# Patient Record
Sex: Male | Born: 1935 | Race: White | Hispanic: No | Marital: Married | Smoking: Former smoker
Health system: Southern US, Community
[De-identification: ages and names within clinical notes are randomized; demographics above are authoritative.]

## PROBLEM LIST (undated history)

## (undated) DIAGNOSIS — N400 Enlarged prostate without lower urinary tract symptoms: Secondary | ICD-10-CM

## (undated) DIAGNOSIS — I10 Essential (primary) hypertension: Secondary | ICD-10-CM

## (undated) DIAGNOSIS — E559 Vitamin D deficiency, unspecified: Secondary | ICD-10-CM

## (undated) DIAGNOSIS — K219 Gastro-esophageal reflux disease without esophagitis: Secondary | ICD-10-CM

## (undated) DIAGNOSIS — H269 Unspecified cataract: Secondary | ICD-10-CM

## (undated) DIAGNOSIS — C61 Malignant neoplasm of prostate: Secondary | ICD-10-CM

## (undated) DIAGNOSIS — E785 Hyperlipidemia, unspecified: Secondary | ICD-10-CM

## (undated) HISTORY — DX: Malignant neoplasm of prostate: C61

## (undated) HISTORY — DX: Hyperlipidemia, unspecified: E78.5

## (undated) HISTORY — DX: Vitamin D deficiency, unspecified: E55.9

## (undated) HISTORY — PX: SPINE SURGERY: SHX786

## (undated) HISTORY — PX: OTHER SURGICAL HISTORY: SHX169

## (undated) HISTORY — DX: Benign prostatic hyperplasia without lower urinary tract symptoms: N40.0

## (undated) HISTORY — DX: Essential (primary) hypertension: I10

## (undated) HISTORY — DX: Gastro-esophageal reflux disease without esophagitis: K21.9

## (undated) HISTORY — DX: Unspecified cataract: H26.9

---

## 1999-07-21 ENCOUNTER — Other Ambulatory Visit: Admission: RE | Admit: 1999-07-21 | Discharge: 1999-07-21 | Payer: Self-pay | Admitting: Urology

## 2002-09-04 ENCOUNTER — Encounter: Payer: Self-pay | Admitting: Gastroenterology

## 2004-03-27 ENCOUNTER — Encounter: Admission: RE | Admit: 2004-03-27 | Discharge: 2004-03-27 | Payer: Self-pay | Admitting: Family Medicine

## 2004-09-19 ENCOUNTER — Ambulatory Visit (HOSPITAL_COMMUNITY): Admission: RE | Admit: 2004-09-19 | Discharge: 2004-09-19 | Payer: Self-pay | Admitting: Family Medicine

## 2005-03-09 ENCOUNTER — Ambulatory Visit (HOSPITAL_COMMUNITY): Admission: RE | Admit: 2005-03-09 | Discharge: 2005-03-09 | Payer: Self-pay | Admitting: Family Medicine

## 2005-08-20 ENCOUNTER — Ambulatory Visit: Admission: RE | Admit: 2005-08-20 | Discharge: 2005-09-17 | Payer: Self-pay | Admitting: Radiation Oncology

## 2005-08-27 ENCOUNTER — Ambulatory Visit: Payer: Self-pay | Admitting: Gastroenterology

## 2005-09-14 ENCOUNTER — Ambulatory Visit: Payer: Self-pay | Admitting: Gastroenterology

## 2005-09-20 LAB — HM COLONOSCOPY

## 2005-09-23 ENCOUNTER — Ambulatory Visit: Payer: Self-pay | Admitting: Gastroenterology

## 2006-01-12 ENCOUNTER — Ambulatory Visit: Admission: RE | Admit: 2006-01-12 | Discharge: 2006-01-25 | Payer: Self-pay | Admitting: Radiation Oncology

## 2006-03-05 ENCOUNTER — Ambulatory Visit (HOSPITAL_COMMUNITY): Admission: RE | Admit: 2006-03-05 | Discharge: 2006-03-05 | Payer: Self-pay | Admitting: *Deleted

## 2006-04-13 ENCOUNTER — Ambulatory Visit: Admission: RE | Admit: 2006-04-13 | Discharge: 2006-04-29 | Payer: Self-pay | Admitting: Radiation Oncology

## 2006-06-14 ENCOUNTER — Ambulatory Visit: Admission: RE | Admit: 2006-06-14 | Discharge: 2006-09-12 | Payer: Self-pay | Admitting: Radiation Oncology

## 2006-10-05 ENCOUNTER — Ambulatory Visit (HOSPITAL_COMMUNITY): Admission: RE | Admit: 2006-10-05 | Discharge: 2006-10-05 | Payer: Self-pay | Admitting: Family Medicine

## 2008-01-11 ENCOUNTER — Ambulatory Visit: Payer: Self-pay | Admitting: Cardiology

## 2009-06-03 ENCOUNTER — Encounter: Payer: Self-pay | Admitting: Cardiology

## 2010-09-11 ENCOUNTER — Encounter: Payer: Self-pay | Admitting: Gastroenterology

## 2010-12-09 ENCOUNTER — Encounter (INDEPENDENT_AMBULATORY_CARE_PROVIDER_SITE_OTHER): Payer: Self-pay | Admitting: *Deleted

## 2010-12-18 ENCOUNTER — Encounter: Payer: Self-pay | Admitting: Gastroenterology

## 2011-01-19 DIAGNOSIS — C61 Malignant neoplasm of prostate: Secondary | ICD-10-CM | POA: Insufficient documentation

## 2011-01-19 DIAGNOSIS — Z860100 Personal history of colon polyps, unspecified: Secondary | ICD-10-CM | POA: Insufficient documentation

## 2011-01-19 DIAGNOSIS — I4510 Unspecified right bundle-branch block: Secondary | ICD-10-CM | POA: Insufficient documentation

## 2011-01-19 DIAGNOSIS — K5730 Diverticulosis of large intestine without perforation or abscess without bleeding: Secondary | ICD-10-CM | POA: Insufficient documentation

## 2011-01-19 DIAGNOSIS — I10 Essential (primary) hypertension: Secondary | ICD-10-CM | POA: Insufficient documentation

## 2011-01-19 DIAGNOSIS — K648 Other hemorrhoids: Secondary | ICD-10-CM | POA: Insufficient documentation

## 2011-01-19 DIAGNOSIS — E785 Hyperlipidemia, unspecified: Secondary | ICD-10-CM | POA: Insufficient documentation

## 2011-01-19 HISTORY — DX: Malignant neoplasm of prostate: C61

## 2011-01-20 ENCOUNTER — Ambulatory Visit
Admission: RE | Admit: 2011-01-20 | Discharge: 2011-01-20 | Payer: Self-pay | Source: Home / Self Care | Attending: Gastroenterology | Admitting: Gastroenterology

## 2011-01-20 ENCOUNTER — Encounter (INDEPENDENT_AMBULATORY_CARE_PROVIDER_SITE_OTHER): Payer: Self-pay | Admitting: *Deleted

## 2011-01-22 NOTE — Letter (Signed)
Summary: New Patient letter  [facility]  [address] [place], [place] [address]   Phone: [phone]  Fax: [phone]       12/09/2010 MRN: [id number]  Alec Davis [address] [place], [place]  [address]  Dear Mr. Artz,  Welcome to the Gastroenterology Division at [facility].    You are scheduled to see Dr.  Jarold Motto on 01-20-11 at 2:45p.m. on the 3rd floor at [facility], [address]  We ask that you try to arrive at our office 15 minutes prior to your appointment time to allow for check-in.  We would like you to complete the enclosed self-administered evaluation form prior to your visit and bring it with you on the day of your appointment.  We will review it with you.  Also, please bring a complete list of all your medications or, if you prefer, bring the medication bottles and we will list them.  Please bring your insurance card so that we may make a copy of it.  If your insurance requires a referral to see a specialist, please bring your referral form from your primary care physician.  Co-payments are due at the time of your visit and may be paid by cash, check or credit card.     Your office visit will consist of a consult with your physician (includes a physical exam), any laboratory testing he/she may order, scheduling of any necessary diagnostic testing (e.g. x-ray, ultrasound, CT-scan), and scheduling of a procedure (e.g. Endoscopy, Colonoscopy) if required.  Please allow enough time on your schedule to allow for any/all of these possibilities.    If you cannot keep your appointment, please call [phone] to cancel or reschedule prior to your appointment date.  This allows [place] the opportunity to schedule an appointment for another patient in need of care.  If you do not cancel or reschedule by 5 p.m. the business day prior to your appointment date, you will be charged a $50.00 late cancellation/no-show fee.    Thank you for choosing  [facility] for your medical needs.  We appreciate the opportunity to care for you.  Please visit [place] at our website  to learn more about our practice.                     Sincerely,                                                             The Gastroenterology Division

## 2011-01-27 ENCOUNTER — Other Ambulatory Visit: Payer: Self-pay | Admitting: Gastroenterology

## 2011-01-27 ENCOUNTER — Encounter (INDEPENDENT_AMBULATORY_CARE_PROVIDER_SITE_OTHER): Payer: Self-pay | Admitting: *Deleted

## 2011-01-27 ENCOUNTER — Other Ambulatory Visit: Payer: Self-pay

## 2011-01-27 DIAGNOSIS — Z86010 Personal history of colonic polyps: Secondary | ICD-10-CM

## 2011-01-28 NOTE — Letter (Signed)
Summary: [facility]: Immunoassay Fecal Occult Blood (iFOB) Order [facility]  [address] [place], [place] [address]   Phone: [phone]  Fax: [phone]       Lab: Immunoassay Fecal Occult Blood (iFOB) Order Form   January 20, 2011 MRN: [id number]   Alec Davis Mar 03, 1923   Physicican Name: Dr. Sheryn Bison  Diagnosis Code: v12.72      Harlow Mares CMA (AAMA)

## 2011-01-28 NOTE — Assessment & Plan Note (Signed)
Summary: discuss COL--ch.   History of Present Illness Visit Type: Initial Consult Primary GI MD: Sheryn Bison MD FACP FAGA Primary Provider: Rudi Heap, MD Requesting Provider: Rudi Heap, MD Chief Complaint: Patient here to discuss having a colonoscopy at age 77. He denies any current GI symptoms. History of Present Illness:   77 year old Caucasian male with mild essential hypertension and hyperlipidemia. He had colonoscopy with removal of a prominent right colon polyp in 2003.Followup colonoscopy in October of 2006 was entirely normal except for mild diverticulosis and some internal hemorrhoids.  He currently is asymptomatic, and denies abdominal pain, irregular bowel habits, melena, hematochezia, anemia, anorexia or weight loss. He does take aspirin 81 mg a day. Family history is noncontributory without a history of colon polyps or colon cancer.   GI Review of Systems      Denies abdominal pain, acid reflux, belching, bloating, chest pain, dysphagia with liquids, dysphagia with solids, heartburn, loss of appetite, nausea, vomiting, vomiting blood, weight loss, and  weight gain.        Denies anal fissure, black tarry stools, change in bowel habit, constipation, diarrhea, diverticulosis, fecal incontinence, heme positive stool, hemorrhoids, irritable bowel syndrome, jaundice, light color stool, liver problems, rectal bleeding, and  rectal pain. Preventive Screening-Counseling & Management      Drug Use:  no.      Current Medications (verified): 1)  Niaspan 750 Mg Cr-Tabs (Niacin (Antihyperlipidemic)) .... Take 1 Tablet By Mouth Once A Day 2)  Tamsulosin Hcl 0.4 Mg Caps (Tamsulosin Hcl) .... Take 1 Tablet By Mouth Once A Day 3)  Lovastatin 20 Mg Tabs (Lovastatin) .... Take 1 Tablet By Mouth Once A Day 4)  Vitamin D 1000 Unit Tabs (Cholecalciferol) .... Take 1 Tablet By Mouth Once A Day 5)  Multivitamins  Tabs (Multiple Vitamin) .... Take 1 Tablet By Mouth Once A Day 6)   Calcium-Vitamin D 600-200 Mg-Unit Tabs (Calcium-Vitamin D) .... Take 1 Tablet By Mouth Once A Day 7)  Aspirin 81 Mg Tbec (Aspirin) .... Take 1 Tablet By Mouth Once A Day  Allergies (verified): No Known Drug Allergies  Past History:  Past medical, surgical, family and social histories (including risk factors) reviewed for relevance to current acute and chronic problems.  Past Medical History: Reviewed history from 01/19/2011 and no changes required. Current Problems:  COLONIC POLYPS, HX OF (ICD-V12.72) INTERNAL HEMORRHOIDS (ICD-455.0) DIVERTICULOSIS, COLON (ICD-562.10) HYPERTENSION (ICD-401.9) RIGHT BUNDLE BRANCH BLOCK (ICD-426.4) PROSTATE CANCER (ICD-185) HYPERLIPIDEMIA (ICD-272.4)  Past Surgical History: back surgery at age 19  Family History: Reviewed history from 01/19/2011 and no changes required. Family History of Colon Polyps: mother No FH of Colon Cancer: Family History of Prostate Cancer:Brother  Social History: Reviewed history from 01/19/2011 and no changes required. Patient used to smoke-smoked pipe Alcohol Use - yes socially retired  married with two children Daily Caffeine Use-2 cups daily Illicit Drug Use - no Drug Use:  no  Review of Systems       The patient complains of back pain and hearing problems.  The patient denies allergy/sinus, anemia, anxiety-new, arthritis/joint pain, blood in urine, breast changes/lumps, change in vision, confusion, cough, coughing up blood, depression-new, fainting, fatigue, fever, headaches-new, heart murmur, heart rhythm changes, itching, menstrual pain, muscle pains/cramps, night sweats, nosebleeds, pregnancy symptoms, shortness of breath, skin rash, sleeping problems, sore throat, swelling of feet/legs, swollen lymph glands, thirst - excessive , urination - excessive , urination changes/pain, urine leakage, vision changes, and voice change.    Vital Signs:  Patient profile:  77 year old male Height:      74  inches Weight:      217.50 pounds BMI:     28.03 BSA:     2.25 Pulse rate:   76 / minute Pulse rhythm:   irregular BP sitting:   144 / 80  (left arm)  Vitals Entered By: Lamona Curl CMA Duncan Dull) (January 20, 2011 2:41 PM)  Physical Exam  General:  Well developed, well nourished, no acute distress.healthy appearing.   Head:  Normocephalic and atraumatic. Eyes:  PERRLA, no icterus.exam deferred to patient's ophthalmologist.   Abdomen:  Soft, nontender and nondistended. No masses, hepatosplenomegaly or hernias noted. Normal bowel sounds. Psych:  Alert and cooperative. Normal mood and affect.   Impression & Recommendations:  Problem # 1:  COLONIC POLYPS, HX OF (ICD-V12.72) Assessment Unchanged I do not think that repeat colonoscopy is indicated at this time an 77 year old white male without a family history of colon cancer. He had a negative colonoscopy 5 years ago. I will repeat his IFOB stool cards for occult blood. If these are positive we will proceed with colonoscopy, but otherwise I do not think his exam is indicated at this time in view of his age.  Problem # 2:  INTERNAL HEMORRHOIDS (ICD-455.0) Assessment: Improved  Problem # 3:  DIVERTICULOSIS, COLON (ICD-562.10) Assessment: Unchanged No bowel regularity or history of rectal bleeding or abdominal pain.  Patient Instructions: 1)  Copy sent to : Rudi Heap, MD 2)  Please go to the basement today for your labs.  3)  The medication list was reviewed and reconciled.  All changed / newly prescribed medications were explained.  A complete medication list was provided to the patient / caregiver.

## 2011-01-28 NOTE — Procedures (Signed)
Summary: Recall Assessment/[facility]  Recall Assessment/[facility]   Imported By: Sherian Rein 01/21/2011 12:43:14  _____________________________________________________________________  External Attachment:    Type:   Image     Comment:   External Document

## 2011-01-28 NOTE — Procedures (Signed)
Summary: Colonoscopy   Colonoscopy  Procedure date:  09/23/2005  Findings:      Location:  [facility].    Colonoscopy  Procedure date:  09/23/2005  Findings:      [facility]:   [facility]   Patient Name: Alec Davis, Alec Davis MRN:  Procedure Procedures: Colonoscopy CPT: [id number].  Personnel: Endoscopist: Vania Rea. Jarold Motto, MD.  Exam Location: Exam performed in Outpatient Clinic. Outpatient  Patient Consent: Procedure, Alternatives, Risks and Benefits discussed, consent obtained, from patient. Consent was obtained by the RN.  Indications  Surveillance of: Adenomatous Polyp(s).  History  Current Medications: Patient is not currently taking Coumadin.  Pre-Exam Physical: Performed Sep 23, 2005. Cardio-pulmonary exam, Rectal exam, Abdominal exam, Extremity exam, Mental status exam WNL.  Exam Exam: Extent of exam reached: Cecum, extent intended: Cecum.  The cecum was identified by appendiceal orifice and IC valve. Patient position: on left side. Duration of exam: 20 minutes. Colon retroflexion performed. Images taken. ASA Classification: II. Tolerance: excellent.  Monitoring: Pulse and BP monitoring, Oximetry used. Supplemental O2 given. at 2 Liters.  Colon Prep Used Golytely for colon prep. Prep results: excellent.  Sedation Meds: Patient assessed and found to be appropriate for moderate (conscious) sedation. Fentanyl 50 mcg. given IV. Versed 6 mg. given IV.  Instrument(s): CF 140L. Serial [id number].  Findings - NORMAL EXAM: Hepatic Flexure to Rectum. Not Seen: Polyps. AVM's. Colitis. Tumors. Melanosis. Crohn's. Hemorrhoids.  - DIVERTICULOSIS: Descending Colon to Sigmoid Colon. Not bleeding. ICD9: Diverticulosis, Colon: 562.10.  - NORMAL EXAM: Cecum to Ascending Colon. Polyps. AVM's. Colitis. Tumors. Melanosis. Crohn's. Diverticulosis.  - HEMORRHOIDS: Internal. Size: Small. Not bleeding. Not thrombosed. ICD9: Hemorrhoids, Internal: 455.0.    Assessment  Diagnoses: 562.10: Diverticulosis, Colon.  455.0: Hemorrhoids, Internal.   Events  Unplanned Interventions: No intervention was required.  Plans Medication Plan: Continue current medications.  Patient Education: Patient given standard instructions for: Diverticulosis. Yearly hemoccult testing recommended.  Disposition: After procedure patient sent to recovery. After recovery patient sent home.  Comments: Re-evaluate health status in 5 years.   CC: Rudi Heap, MD     Larey Dresser, MD  This report was created from the original endoscopy report, which was reviewed and signed by the above listed endoscopist.

## 2011-03-07 ENCOUNTER — Encounter: Payer: Self-pay | Admitting: Family Medicine

## 2011-03-07 DIAGNOSIS — K219 Gastro-esophageal reflux disease without esophagitis: Secondary | ICD-10-CM | POA: Insufficient documentation

## 2011-03-07 DIAGNOSIS — E559 Vitamin D deficiency, unspecified: Secondary | ICD-10-CM | POA: Insufficient documentation

## 2011-05-05 NOTE — Assessment & Plan Note (Signed)
[facility]                            CARDIOLOGY OFFICE NOTE   NAME:Alec Davis, Alec Davis                     MRN:          [id number]  DATE:01/11/2008                            DOB:          June 11, 1923    PRIMARY CARE PHYSICIAN:  Dr. Vernon Prey.   REASON FOR PRESENTATION:  Evaluate patient abnormal EKG and  cardiovascular risk factors.   HISTORY OF PRESENT ILLNESS:  Patient is a pleasant 77 year old gentleman  with a history of right bundle block on EKG.  He has not had any cardiac  history, however.  He has had stress tests x2.  The last that I can find  in 2004.  This looked to be an exercise treadmill test.  It was  unremarkable.  I do not see anything more recent than that.   He is an active gentleman.  He does not exercise routinely, though he  golfs and walks up and down stairs many times a day.  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  He denies any chest discomfort, neck or  arm discomfort.  He has had no palpitations, presyncope or syncope.  He  denies PND or orthopnea.   PAST MEDICAL HISTORY:  1. Hyperlipidemia x5 years.  2. Prostate cancer.   PAST SURGICAL HISTORY:  Back surgery at age 88.   ALLERGIES:  NONE.   MEDICATIONS:  1. Flomax.  2. Advicor 750/20 daily.   SOCIAL HISTORY:  The patient is retired.  He is married.  He has 2  children.  He has never smoked cigarettes, but smokes pipes.  He drinks  alcohol socially.   FAMILY HISTORY:  Noncontributory for early coronary disease, although  his father died of suicide at age 29.   REVIEW OF SYSTEMS:  As stated in the HPI, and otherwise negative for  other systems, except for decreased hearing, hiatal hernia, and some  mild arthritis.  Negative for other systems.   PHYSICAL EXAMINATION:  Patient is in no distress.  Blood pressure 158/78.  Heart rate 83 and regular.  Weight 215 pounds.  Body mass index 27.  HEENT:  Eyelids unremarkable.  Pupils equal, round, and reactive  to  light.  Fundi not visualized.  Oral mucosa unremarkable.  NECK:  No jugular venous distention at 45 degrees.  Carotid upstroke  brisk and symmetric.  No bruits.  No thyromegaly.  LYMPHATICS:  No cervical, axillary, or inguinal adenopathy.  LUNGS:  Clear to auscultation bilaterally.  BACK:  No costovertebral angle tenderness.  CHEST:  Unremarkable.  HEART:  PMI not displaced or sustained.  S1 and S2 within normal limits.  No S3.  No S4.  No clicks.  No rubs.  No murmurs.  ABDOMEN:  Flat.  Positive bowel sounds.  Normal in frequency and pitch.  No bruits.  No rebound.  No guarding.  No midline pulsatile mass.  No  hepatomegaly.  No splenomegaly.  SKIN:  No rashes.  No nodules.  EXTREMITIES:  Two plus pulses throughout.  No edema.  No cyanosis.  No  clubbing.  NEUROLOGIC:  Oriented to person, place, and time.  Cranial nerves 2  through 12 grossly intact.  Motor grossly intact throughout.  EKG:  Sinus rhythm, rate 83, right bundle branch block, premature  ectopic complexes, no acute ST-T wave changes.   ASSESSMENT AND PLAN:  1. Hypertension.  Blood pressure is slightly elevated today.  However,      he says he kept a blood pressure diary for Dr. Christell Constant, and it was      well under 140.  I looked through some previous notes, and he      usually has blood pressures under the 140/90 threshold.  Therefore,      I will make no change to his regimen.  2. Right bundle branch block.  This seems to be chronic.  He has had      no symptomatic evidence of structural heart disease.  No further      cardiovascular testing is suggested.  3. Weight.  We did discuss his weight, which is slightly up.  I      prescribed for him an exercise regimen in that he does not      participate routinely in aerobic exercises.  I suspect he will      follow through with this.  4. Dyslipidemia.  Per Dr. Christell Constant.  He is having aggressive appropriate      management.  5. Followup.  I will see the patient back as  needed.     Rollene Rotunda, MD, [facility]  Electronically Signed    JH/MedQ  DD: 01/11/2008  DT: 01/11/2008  Job #: [id number]   cc:   Ernestina Penna, M.D.

## 2011-07-22 ENCOUNTER — Encounter (INDEPENDENT_AMBULATORY_CARE_PROVIDER_SITE_OTHER): Payer: Self-pay | Admitting: Ophthalmology

## 2012-01-07 DIAGNOSIS — E785 Hyperlipidemia, unspecified: Secondary | ICD-10-CM | POA: Diagnosis not present

## 2012-01-07 DIAGNOSIS — C61 Malignant neoplasm of prostate: Secondary | ICD-10-CM | POA: Diagnosis not present

## 2012-01-07 DIAGNOSIS — K219 Gastro-esophageal reflux disease without esophagitis: Secondary | ICD-10-CM | POA: Diagnosis not present

## 2012-01-12 DIAGNOSIS — R5381 Other malaise: Secondary | ICD-10-CM | POA: Diagnosis not present

## 2012-01-12 DIAGNOSIS — E785 Hyperlipidemia, unspecified: Secondary | ICD-10-CM | POA: Diagnosis not present

## 2012-01-12 DIAGNOSIS — R5383 Other fatigue: Secondary | ICD-10-CM | POA: Diagnosis not present

## 2012-01-12 DIAGNOSIS — E559 Vitamin D deficiency, unspecified: Secondary | ICD-10-CM | POA: Diagnosis not present

## 2012-01-12 DIAGNOSIS — R7989 Other specified abnormal findings of blood chemistry: Secondary | ICD-10-CM | POA: Diagnosis not present

## 2012-04-18 DIAGNOSIS — N400 Enlarged prostate without lower urinary tract symptoms: Secondary | ICD-10-CM | POA: Diagnosis not present

## 2012-04-18 DIAGNOSIS — R9431 Abnormal electrocardiogram [ECG] [EKG]: Secondary | ICD-10-CM | POA: Diagnosis not present

## 2012-04-18 DIAGNOSIS — E785 Hyperlipidemia, unspecified: Secondary | ICD-10-CM | POA: Diagnosis not present

## 2012-04-25 DIAGNOSIS — E559 Vitamin D deficiency, unspecified: Secondary | ICD-10-CM | POA: Diagnosis not present

## 2012-04-25 DIAGNOSIS — E785 Hyperlipidemia, unspecified: Secondary | ICD-10-CM | POA: Diagnosis not present

## 2012-04-25 DIAGNOSIS — I10 Essential (primary) hypertension: Secondary | ICD-10-CM | POA: Diagnosis not present

## 2012-04-25 DIAGNOSIS — Z125 Encounter for screening for malignant neoplasm of prostate: Secondary | ICD-10-CM | POA: Diagnosis not present

## 2012-04-25 DIAGNOSIS — R7989 Other specified abnormal findings of blood chemistry: Secondary | ICD-10-CM | POA: Diagnosis not present

## 2012-05-30 DIAGNOSIS — C61 Malignant neoplasm of prostate: Secondary | ICD-10-CM | POA: Diagnosis not present

## 2012-05-30 DIAGNOSIS — R972 Elevated prostate specific antigen [PSA]: Secondary | ICD-10-CM | POA: Diagnosis not present

## 2012-08-17 DIAGNOSIS — N400 Enlarged prostate without lower urinary tract symptoms: Secondary | ICD-10-CM | POA: Diagnosis not present

## 2012-08-17 DIAGNOSIS — E785 Hyperlipidemia, unspecified: Secondary | ICD-10-CM | POA: Diagnosis not present

## 2012-08-23 ENCOUNTER — Encounter: Payer: Self-pay | Admitting: Gastroenterology

## 2012-08-25 DIAGNOSIS — R5381 Other malaise: Secondary | ICD-10-CM | POA: Diagnosis not present

## 2012-08-25 DIAGNOSIS — E559 Vitamin D deficiency, unspecified: Secondary | ICD-10-CM | POA: Diagnosis not present

## 2012-08-25 DIAGNOSIS — I10 Essential (primary) hypertension: Secondary | ICD-10-CM | POA: Diagnosis not present

## 2012-08-25 DIAGNOSIS — R5383 Other fatigue: Secondary | ICD-10-CM | POA: Diagnosis not present

## 2012-08-25 DIAGNOSIS — E785 Hyperlipidemia, unspecified: Secondary | ICD-10-CM | POA: Diagnosis not present

## 2012-09-28 DIAGNOSIS — H26499 Other secondary cataract, unspecified eye: Secondary | ICD-10-CM | POA: Diagnosis not present

## 2012-10-19 DIAGNOSIS — Z23 Encounter for immunization: Secondary | ICD-10-CM | POA: Diagnosis not present

## 2012-11-30 DIAGNOSIS — E785 Hyperlipidemia, unspecified: Secondary | ICD-10-CM | POA: Diagnosis not present

## 2012-11-30 DIAGNOSIS — E559 Vitamin D deficiency, unspecified: Secondary | ICD-10-CM | POA: Diagnosis not present

## 2012-11-30 DIAGNOSIS — C61 Malignant neoplasm of prostate: Secondary | ICD-10-CM | POA: Diagnosis not present

## 2012-11-30 DIAGNOSIS — R5383 Other fatigue: Secondary | ICD-10-CM | POA: Diagnosis not present

## 2012-11-30 DIAGNOSIS — R5381 Other malaise: Secondary | ICD-10-CM | POA: Diagnosis not present

## 2012-11-30 DIAGNOSIS — I10 Essential (primary) hypertension: Secondary | ICD-10-CM | POA: Diagnosis not present

## 2012-12-05 DIAGNOSIS — Z1212 Encounter for screening for malignant neoplasm of rectum: Secondary | ICD-10-CM | POA: Diagnosis not present

## 2013-03-21 ENCOUNTER — Ambulatory Visit: Payer: Self-pay | Admitting: Family Medicine

## 2013-04-03 ENCOUNTER — Encounter: Payer: Self-pay | Admitting: Family Medicine

## 2013-04-03 ENCOUNTER — Ambulatory Visit (INDEPENDENT_AMBULATORY_CARE_PROVIDER_SITE_OTHER): Payer: Medicare Other | Admitting: Family Medicine

## 2013-04-03 VITALS — BP 129/74 | HR 61 | Temp 97.7°F | Ht 71.0 in | Wt 207.4 lb

## 2013-04-03 DIAGNOSIS — N400 Enlarged prostate without lower urinary tract symptoms: Secondary | ICD-10-CM | POA: Diagnosis not present

## 2013-04-03 DIAGNOSIS — E785 Hyperlipidemia, unspecified: Secondary | ICD-10-CM

## 2013-04-03 DIAGNOSIS — I10 Essential (primary) hypertension: Secondary | ICD-10-CM

## 2013-04-03 DIAGNOSIS — E559 Vitamin D deficiency, unspecified: Secondary | ICD-10-CM | POA: Diagnosis not present

## 2013-04-03 DIAGNOSIS — C61 Malignant neoplasm of prostate: Secondary | ICD-10-CM

## 2013-04-03 DIAGNOSIS — K219 Gastro-esophageal reflux disease without esophagitis: Secondary | ICD-10-CM | POA: Diagnosis not present

## 2013-04-03 NOTE — Patient Instructions (Addendum)
Fall precautions discussed Continue current meds and therapeutic lifestyle changes 

## 2013-04-03 NOTE — Progress Notes (Addendum)
  Subjective:    Patient ID: Alec Davis, male    DOB: 1923/03/06, 79 y.o.   MRN: [id number]  HPI This patient presents for recheck of multiple medical problems. No one  accompanies the patient today.  Patient Active Problem List  Diagnosis  . PROSTATE CANCER  . HYPERLIPIDEMIA  . HYPERTENSION  . RIGHT BUNDLE BRANCH BLOCK  . INTERNAL HEMORRHOIDS  . DIVERTICULOSIS, COLON  . COLONIC POLYPS, HX OF  . Vitamin D deficiency  . GERD (gastroesophageal reflux disease)    In addition, no other complaints  The allergies, current medications, past medical history, surgical history, family and social history are reviewed.  Immunizations reviewed.  Health maintenance reviewed.  The following items are outstanding: None.      Review of Systems  HENT: Negative.   Respiratory: Negative.   Cardiovascular: Negative.   Gastrointestinal: Negative.   Genitourinary: Positive for frequency.  Musculoskeletal: Positive for back pain (LBP).  Allergic/Immunologic: Negative.   Neurological: Negative.   Psychiatric/Behavioral: The patient is nervous/anxious (slight).        Objective:   Physical Exam BP 129/74  Pulse 61  Temp(Src) 97.7 F (36.5 C) (Oral)  Ht 5\' 11"  (1.803 m)  Wt 207 lb 6.4 oz (94.076 kg)  BMI 28.94 kg/m2  The patient appeared well nourished and normally developed, alert and oriented to time and place. Speech, behavior and judgement appear normal. Vital signs as documented.  Head exam is unremarkable. No scleral icterus or pallor noted. He wears hearing aids bilaterally.  Neck is without jugular venous distension, thyromegally, or carotid bruits. Carotid upstrokes are brisk bilaterally. No cervical adenopathy. Lungs are clear anteriorly and posteriorly to auscultation. Normal respiratory effort. Cardiac exam reveals regular rate and rhythm @ 72/min. First and second heart sounds normal. No murmurs, rubs or gallops.  Abdominal exam reveals normal bowl sounds, no  masses, no organomegaly and no aortic enlargement. No inguinal adenopathy. Extremities are nonedematous and both femoral and pedal pulses are normal. Diminished dorsalis pedis pulses bilateral. Skin without pallor or jaundice.  Warm and dry, without rash. Neurologic exam reveals normal deep tendon reflexes and normal sensation.          Assessment & Plan:  Other and unspecified hyperlipidemia - Plan: Hepatic function panel, Lipid panel  BPH (benign prostatic hypertrophy)  GERD (gastroesophageal reflux disease) - Plan: POCT CBC, BASIC METABOLIC PANEL WITH GFR, Hepatic function panel  Unspecified vitamin D deficiency - Plan: Vitamin D 25 hydroxy  HYPERTENSION  PROSTATE CANCER  Vitamin D deficiency  Patient is followed by Dr. Earlene Plater for his prostate cancer Patient will return for fasting labs He will be rechecked in 4 months

## 2013-05-05 ENCOUNTER — Other Ambulatory Visit: Payer: Self-pay | Admitting: Family Medicine

## 2013-06-05 DIAGNOSIS — N320 Bladder-neck obstruction: Secondary | ICD-10-CM | POA: Diagnosis not present

## 2013-06-05 DIAGNOSIS — C61 Malignant neoplasm of prostate: Secondary | ICD-10-CM | POA: Diagnosis not present

## 2013-08-01 ENCOUNTER — Other Ambulatory Visit (INDEPENDENT_AMBULATORY_CARE_PROVIDER_SITE_OTHER): Payer: Medicare Other

## 2013-08-01 DIAGNOSIS — N400 Enlarged prostate without lower urinary tract symptoms: Secondary | ICD-10-CM | POA: Diagnosis not present

## 2013-08-01 DIAGNOSIS — K219 Gastro-esophageal reflux disease without esophagitis: Secondary | ICD-10-CM | POA: Diagnosis not present

## 2013-08-01 DIAGNOSIS — E785 Hyperlipidemia, unspecified: Secondary | ICD-10-CM | POA: Diagnosis not present

## 2013-08-01 DIAGNOSIS — E559 Vitamin D deficiency, unspecified: Secondary | ICD-10-CM | POA: Diagnosis not present

## 2013-08-01 DIAGNOSIS — F329 Major depressive disorder, single episode, unspecified: Secondary | ICD-10-CM | POA: Diagnosis not present

## 2013-08-01 LAB — POCT CBC
Granulocyte percent: 71.3 %G (ref 37–80)
Lymph, poc: 1.6 (ref 0.6–3.4)
MCV: 92.7 fL (ref 80–97)
MPV: 6.3 fL (ref 0–99.8)
POC Granulocyte: 4.8 (ref 2–6.9)
POC LYMPH PERCENT: 24.1 %L (ref 10–50)
Platelet Count, POC: 245 10*3/uL (ref 142–424)
RDW, POC: 13 %
WBC: 6.8 10*3/uL (ref 4.6–10.2)

## 2013-08-01 LAB — LIPID PANEL
Cholesterol: 151 mg/dL (ref 0–200)
Triglycerides: 126 mg/dL (ref <150)
VLDL: 25 mg/dL (ref 0–40)

## 2013-08-01 LAB — BASIC METABOLIC PANEL WITH GFR
BUN: 13 mg/dL (ref 6–23)
GFR, Est African American: 89 mL/min
GFR, Est Non African American: 77 mL/min
Potassium: 4.4 mEq/L (ref 3.5–5.3)

## 2013-08-01 LAB — HEPATIC FUNCTION PANEL
Albumin: 3.8 g/dL (ref 3.5–5.2)
Indirect Bilirubin: 0.5 mg/dL (ref 0.0–0.9)
Total Protein: 6.6 g/dL (ref 6.0–8.3)

## 2013-08-03 ENCOUNTER — Ambulatory Visit (INDEPENDENT_AMBULATORY_CARE_PROVIDER_SITE_OTHER): Payer: Medicare Other | Admitting: Family Medicine

## 2013-08-03 ENCOUNTER — Encounter: Payer: Self-pay | Admitting: Family Medicine

## 2013-08-03 VITALS — BP 115/67 | HR 69 | Temp 96.9°F | Ht 71.25 in | Wt 207.0 lb

## 2013-08-03 DIAGNOSIS — F329 Major depressive disorder, single episode, unspecified: Secondary | ICD-10-CM

## 2013-08-03 DIAGNOSIS — I10 Essential (primary) hypertension: Secondary | ICD-10-CM

## 2013-08-03 DIAGNOSIS — E785 Hyperlipidemia, unspecified: Secondary | ICD-10-CM

## 2013-08-03 DIAGNOSIS — K219 Gastro-esophageal reflux disease without esophagitis: Secondary | ICD-10-CM | POA: Diagnosis not present

## 2013-08-03 DIAGNOSIS — E559 Vitamin D deficiency, unspecified: Secondary | ICD-10-CM

## 2013-08-03 DIAGNOSIS — N400 Enlarged prostate without lower urinary tract symptoms: Secondary | ICD-10-CM | POA: Diagnosis not present

## 2013-08-03 DIAGNOSIS — C61 Malignant neoplasm of prostate: Secondary | ICD-10-CM

## 2013-08-03 DIAGNOSIS — F32A Depression, unspecified: Secondary | ICD-10-CM | POA: Insufficient documentation

## 2013-08-03 NOTE — Progress Notes (Signed)
  Subjective:    Patient ID: Alec Davis, male    DOB: Aug 11, 1923, 79 y.o.   MRN: [id number]  HPI Patient returns to clinic today with his wife for followup and management of chronic medical problems. Recent lab work done is in the chart. Patient saw the urologist in June regarding his prostate cancer and he sees him about once a year and everything is stable with that.   Review of Systems  Constitutional: Positive for fatigue (some).  HENT: Negative.  Negative for ear pain, congestion, rhinorrhea, sneezing, postnasal drip and sinus pressure.   Eyes: Negative.  Negative for photophobia, pain, discharge, redness, itching and visual disturbance.  Respiratory: Negative.  Negative for cough, choking, chest tightness, shortness of breath and wheezing.   Cardiovascular: Negative.  Negative for chest pain, palpitations and leg swelling.  Gastrointestinal: Negative.  Negative for vomiting, abdominal pain, diarrhea, constipation, blood in stool and rectal pain.  Endocrine: Negative.  Negative for cold intolerance, heat intolerance, polydipsia, polyphagia and polyuria.  Genitourinary: Positive for frequency. Negative for dysuria, urgency and hematuria.  Musculoskeletal: Positive for back pain (LBP due to arthritis). Negative for joint swelling and arthralgias.  Skin: Negative.  Negative for color change, pallor, rash and wound.  Allergic/Immunologic: Negative.  Negative for environmental allergies.  Neurological: Negative.  Negative for dizziness, tremors, syncope, weakness, light-headedness, numbness and headaches.  Hematological: Positive for adenopathy.  Psychiatric/Behavioral: Positive for decreased concentration (slight memory deficit). Negative for sleep disturbance and agitation. The patient is nervous/anxious (worsening depression).        Objective:   Physical Exam BP 115/67  Pulse 69  Temp(Src) 96.9 F (36.1 C) (Oral)  Ht 5' 11.25" (1.81 m)  Wt 207 lb (93.895 kg)  BMI 28.66  kg/m2  The patient appeared well nourished and normally developed for his age, alert and oriented to time and place. Speech, behavior and judgement appear normal. Vital signs as documented.  Head exam is unremarkable. No scleral icterus or pallor noted. He wears bilateral hearing aids and there was some cerumen in the right ear canal. Mouth and throat were normal. Neck is without jugular venous distension, thyromegally, or carotid bruits. Carotid upstrokes are brisk bilaterally. No cervical adenopathy. Lungs are clear anteriorly and posteriorly to auscultation. Normal respiratory effort. Cardiac exam reveals regular rate and rhythm at 72 per minute. First and second heart sounds normal.  No murmurs, rubs or gallops.  Abdominal exam reveals normal bowl sounds, no masses, no organomegaly and no aortic enlargement. No inguinal adenopathy. There is no abdominal tenderness Extremities are nonedematous and both femoral  pulses are normal. Skin without pallor or jaundice.  Warm and dry, without rash. Neurologic exam reveals normal deep tendon reflexes and normal sensation.    We spent a lot of time discussing future care and being closer to his children because of their rising ages and living in the community without family being caused by.       Assessment & Plan:  1. Hyperlipemia  2. BPH (benign prostatic hypertrophy)  3. Vitamin D deficiency  4. Depression  5. HYPERTENSION  6. PROSTATE CANCER -Followed by the urologist, Dr. Darvin Neighbours  Patient Instructions  Fall precautions discussed contnue current meds and therapeutic lifestyle changes Return to clinic in September or October for a flu shot   Nyra Capes MD

## 2013-08-03 NOTE — Progress Notes (Signed)
Dr. Christell Constant spoke with their son Renae Fickle about long term care options .

## 2013-08-03 NOTE — Patient Instructions (Signed)
Fall precautions discussed contnue current meds and therapeutic lifestyle changes Return to clinic in September or October for a flu shot

## 2013-09-21 DIAGNOSIS — L570 Actinic keratosis: Secondary | ICD-10-CM | POA: Diagnosis not present

## 2013-09-21 DIAGNOSIS — I781 Nevus, non-neoplastic: Secondary | ICD-10-CM | POA: Diagnosis not present

## 2013-09-21 DIAGNOSIS — Z23 Encounter for immunization: Secondary | ICD-10-CM | POA: Diagnosis not present

## 2013-09-21 DIAGNOSIS — L708 Other acne: Secondary | ICD-10-CM | POA: Diagnosis not present

## 2013-09-21 DIAGNOSIS — D235 Other benign neoplasm of skin of trunk: Secondary | ICD-10-CM | POA: Diagnosis not present

## 2013-10-27 ENCOUNTER — Other Ambulatory Visit: Payer: Self-pay

## 2013-10-27 MED ORDER — TAMSULOSIN HCL 0.4 MG PO CAPS: 0.4000 mg | ORAL_CAPSULE | Freq: Every day | ORAL | Status: DC

## 2013-11-08 ENCOUNTER — Other Ambulatory Visit: Payer: Self-pay | Admitting: Family Medicine

## 2013-11-14 ENCOUNTER — Ambulatory Visit: Payer: Medicare Other | Admitting: Family Medicine

## 2014-01-15 ENCOUNTER — Ambulatory Visit (INDEPENDENT_AMBULATORY_CARE_PROVIDER_SITE_OTHER): Payer: Medicare Other | Admitting: Family Medicine

## 2014-01-15 ENCOUNTER — Encounter: Payer: Self-pay | Admitting: Family Medicine

## 2014-01-15 ENCOUNTER — Telehealth: Payer: Self-pay | Admitting: Family Medicine

## 2014-01-15 VITALS — BP 143/70 | HR 92 | Temp 99.8°F | Ht 71.25 in | Wt 213.2 lb

## 2014-01-15 DIAGNOSIS — J09X2 Influenza due to identified novel influenza A virus with other respiratory manifestations: Secondary | ICD-10-CM | POA: Diagnosis not present

## 2014-01-15 DIAGNOSIS — R059 Cough, unspecified: Secondary | ICD-10-CM | POA: Diagnosis not present

## 2014-01-15 DIAGNOSIS — J069 Acute upper respiratory infection, unspecified: Secondary | ICD-10-CM

## 2014-01-15 DIAGNOSIS — R509 Fever, unspecified: Secondary | ICD-10-CM | POA: Diagnosis not present

## 2014-01-15 DIAGNOSIS — R05 Cough: Secondary | ICD-10-CM

## 2014-01-15 LAB — POCT INFLUENZA A/B
Influenza A, POC: POSITIVE
Influenza B, POC: NEGATIVE

## 2014-01-15 LAB — POCT CBC
GRANULOCYTE PERCENT: 82.5 % — AB (ref 37–80)
HEMATOCRIT: 40.7 % — AB (ref 43.5–53.7)
Hemoglobin: 13.2 g/dL — AB (ref 14.1–18.1)
LYMPH, POC: 0.8 (ref 0.6–3.4)
MCH: 31.2 pg (ref 27–31.2)
MCHC: 32.5 g/dL (ref 31.8–35.4)
MCV: 95.8 fL (ref 80–97)
MPV: 7 fL (ref 0–99.8)
PLATELET COUNT, POC: 207 10*3/uL (ref 142–424)
POC Granulocyte: 5 (ref 2–6.9)
POC LYMPH PERCENT: 12.7 %L (ref 10–50)
RBC: 4.3 M/uL — AB (ref 4.69–6.13)
RDW, POC: 13.7 %
WBC: 6.1 10*3/uL (ref 4.6–10.2)

## 2014-01-15 MED ORDER — OSELTAMIVIR PHOSPHATE 75 MG PO CAPS: 75.0000 mg | ORAL_CAPSULE | Freq: Two times a day (BID) | ORAL | Status: DC

## 2014-01-15 NOTE — Telephone Encounter (Signed)
Pt has fever ans cough appt scheduled

## 2014-01-15 NOTE — Progress Notes (Signed)
Subjective:    Patient ID: Alec Davis, male    DOB: 04-12-1923, 80 y.o.   MRN: [id number]  HPI Pt is seen for fever and cough(dry, non productive) that started this AM.    Review of Systems  Constitutional: Positive for fever and fatigue.  HENT: Negative for ear pain, postnasal drip and sore throat.   Respiratory: Positive for cough (dry, non productive).   Musculoskeletal: Negative for arthralgias.  Neurological: Negative for dizziness and headaches.    Patient Active Problem List   Diagnosis Date Noted  . Hyperlipemia 08/03/2013  . BPH (benign prostatic hypertrophy) 08/03/2013  . Depression 08/03/2013  . Vitamin D deficiency 03/07/2011  . GERD (gastroesophageal reflux disease) 03/07/2011  . PROSTATE CANCER 01/19/2011  . HYPERLIPIDEMIA 01/19/2011  . HYPERTENSION 01/19/2011  . RIGHT BUNDLE BRANCH BLOCK 01/19/2011  . INTERNAL HEMORRHOIDS 01/19/2011  . DIVERTICULOSIS, COLON 01/19/2011  . COLONIC POLYPS, HX OF 01/19/2011   Outpatient Encounter Prescriptions as of 01/15/2014  Medication Sig  . aspirin 81 MG EC tablet Take 81 mg by mouth daily after supper.    . Calcium-Magnesium-Vitamin D (CITRACAL CALCIUM+D) 600-40-500 MG-MG-UNIT TB24 Take 2 tablets by mouth 1 dose over 46 hours.    . lovastatin (MEVACOR) 20 MG tablet Take 20 mg by mouth at bedtime.    . multivitamin (THERAGRAN) per tablet Take 1 tablet by mouth daily.    . niacin (NIASPAN) 750 MG CR tablet TAKE ONE TABLET BY MOUTH ONE TIME DAILY  . tamsulosin (FLOMAX) 0.4 MG CAPS capsule Take 1 capsule (0.4 mg total) by mouth daily.       Objective:   Physical Exam  Nursing note and vitals reviewed. Constitutional: He is oriented to person, place, and time. He appears well-developed and well-nourished. No distress.  HENT:  Head: Normocephalic and atraumatic.  Right Ear: External ear normal.  Left Ear: External ear normal.  Mouth/Throat: No oropharyngeal exudate.  Throat was red posteriorly and there is bilateral  nasal congestion  Eyes: Conjunctivae and EOM are normal. Pupils are equal, round, and reactive to light. Right eye exhibits no discharge. Left eye exhibits no discharge. No scleral icterus.  Neck: Normal range of motion. Neck supple. No thyromegaly present.  Cardiovascular: Normal rate, regular rhythm and normal heart sounds.  Exam reveals no gallop and no friction rub.   No murmur heard. 72 per minute  Pulmonary/Chest: Effort normal and breath sounds normal. No respiratory distress. He has no wheezes. He has no rales. He exhibits no tenderness.  Dry cough  Musculoskeletal: Normal range of motion.  Lymphadenopathy:    He has no cervical adenopathy.  Neurological: He is alert and oriented to person, place, and time.  Skin: Skin is warm and dry. No rash noted.  Psychiatric: He has a normal mood and affect. His behavior is normal. Judgment and thought content normal.   .BP 143/70  Pulse 92  Temp(Src) 99.8 F (37.7 C) (Oral)  Ht 5' 11.25" (1.81 m)  Wt 213 lb 3.2 oz (96.707 kg)  BMI 29.52 kg/m2 Results for orders placed in visit on 01/15/14  POCT INFLUENZA A/B      Result Value Range   Influenza A, POC Positive     Influenza B, POC Negative     CBC within normal limits White blood cell count not elevated       Assessment & Plan:  1. Fever - POCT CBC - POCT Influenza A/B - oseltamivir (TAMIFLU) 75 MG capsule; Take 1 capsule (75 mg total)  by mouth 2 (two) times daily.  Dispense: 10 capsule; Refill: 0  2. Cough - POCT CBC - POCT Influenza A/B - oseltamivir (TAMIFLU) 75 MG capsule; Take 1 capsule (75 mg total) by mouth 2 (two) times daily.  Dispense: 10 capsule; Refill: 0  3. URI (upper respiratory infection) - oseltamivir (TAMIFLU) 75 MG capsule; Take 1 capsule (75 mg total) by mouth 2 (two) times daily.  Dispense: 10 capsule; Refill: 0  4. Influenza due to identified novel influenza A virus with other respiratory manifestations - oseltamivir (TAMIFLU) 75 MG capsule; Take 1  capsule (75 mg total) by mouth 2 (two) times daily.  Dispense: 10 capsule; Refill: 0  Patient Instructions  Drink plenty of fluids Take Tylenol for aches pains and fever Use Mucinex maximum strength, blue and white in color, over-the-counter one twice daily with a large glass of water Take the prescribed medicine as directed   Arrie Senate MD

## 2014-01-15 NOTE — Patient Instructions (Signed)
Drink plenty of fluids Take Tylenol for aches pains and fever Use Mucinex maximum strength, blue and white in color, over-the-counter one twice daily with a large glass of water Take the prescribed medicine as directed

## 2014-01-23 ENCOUNTER — Other Ambulatory Visit: Payer: Self-pay | Admitting: Family Medicine

## 2014-01-25 ENCOUNTER — Encounter: Payer: Self-pay | Admitting: Family Medicine

## 2014-01-25 ENCOUNTER — Ambulatory Visit (INDEPENDENT_AMBULATORY_CARE_PROVIDER_SITE_OTHER): Payer: Medicare Other

## 2014-01-25 ENCOUNTER — Ambulatory Visit (INDEPENDENT_AMBULATORY_CARE_PROVIDER_SITE_OTHER): Payer: Medicare Other | Admitting: Family Medicine

## 2014-01-25 VITALS — BP 126/75 | HR 84 | Temp 98.0°F | Ht 71.25 in | Wt 209.0 lb

## 2014-01-25 DIAGNOSIS — C61 Malignant neoplasm of prostate: Secondary | ICD-10-CM

## 2014-01-25 DIAGNOSIS — K219 Gastro-esophageal reflux disease without esophagitis: Secondary | ICD-10-CM

## 2014-01-25 DIAGNOSIS — Z23 Encounter for immunization: Secondary | ICD-10-CM | POA: Diagnosis not present

## 2014-01-25 DIAGNOSIS — I10 Essential (primary) hypertension: Secondary | ICD-10-CM

## 2014-01-25 DIAGNOSIS — E785 Hyperlipidemia, unspecified: Secondary | ICD-10-CM

## 2014-01-25 DIAGNOSIS — E559 Vitamin D deficiency, unspecified: Secondary | ICD-10-CM

## 2014-01-25 NOTE — Progress Notes (Signed)
Subjective:    Patient ID: Alec Davis, male    DOB: [birth year], 79 y.o.   MRN: [id number]  HPI Pt here for follow up and management of chronic medical problems. This patient is recovering from a recent bout of flu. He is doing well. He will be given FOBT to return. He'll also get a chest x-ray and a Prevnar vaccine. He will need to return for his fasting lab work. The patient indicates that he has lost 2 of his brothers since 2023-11-30. One died of dementia and one died with complications from a lifelong problem with polio.        Patient Active Problem List   Diagnosis Date Noted  . Hyperlipemia 08/03/2013  . BPH (benign prostatic hypertrophy) 08/03/2013  . Depression 08/03/2013  . Vitamin D deficiency 03/07/2011  . GERD (gastroesophageal reflux disease) 03/07/2011  . PROSTATE CANCER 01/19/2011  . HYPERLIPIDEMIA 01/19/2011  . HYPERTENSION 01/19/2011  . RIGHT BUNDLE BRANCH BLOCK 01/19/2011  . INTERNAL HEMORRHOIDS 01/19/2011  . DIVERTICULOSIS, COLON 01/19/2011  . COLONIC POLYPS, HX OF 01/19/2011   Outpatient Encounter Prescriptions as of 01/25/2014  Medication Sig  . aspirin 81 MG EC tablet Take 81 mg by mouth daily after supper.    . Calcium-Magnesium-Vitamin D (CITRACAL CALCIUM+D) 600-40-500 MG-MG-UNIT TB24 Take 2 tablets by mouth 1 dose over 46 hours.    . lovastatin (MEVACOR) 20 MG tablet TAKE ONE TABLET BY MOUTH ONE TIME DAILY  . multivitamin (THERAGRAN) per tablet Take 1 tablet by mouth daily.    . niacin (NIASPAN) 750 MG CR tablet TAKE ONE TABLET BY MOUTH ONE TIME DAILY  . tamsulosin (FLOMAX) 0.4 MG CAPS capsule Take 1 capsule (0.4 mg total) by mouth daily.  . [DISCONTINUED] oseltamivir (TAMIFLU) 75 MG capsule Take 1 capsule (75 mg total) by mouth 2 (two) times daily.    Review of Systems  Constitutional: Negative.   HENT: Negative.   Eyes: Negative.   Respiratory: Negative.   Cardiovascular: Negative.   Gastrointestinal: Negative.   Endocrine: Negative.     Genitourinary: Negative.   Musculoskeletal: Negative.   Skin: Negative.   Allergic/Immunologic: Negative.   Neurological: Negative.   Hematological: Negative.   Psychiatric/Behavioral: Negative.        Objective:   Physical Exam  Nursing note and vitals reviewed. Constitutional: He is oriented to person, place, and time. He appears well-developed and well-nourished. No distress.  Pleasant, cooperative and young appearing patient for his age of 66. Somewhat down because of the loss of his 2 brothers over the past 3 months.  HENT:  Head: Normocephalic and atraumatic.  Right Ear: External ear normal.  Left Ear: External ear normal.  Nose: Nose normal.  Mouth/Throat: Oropharynx is clear and moist. No oropharyngeal exudate.  Eyes: Conjunctivae and EOM are normal. Pupils are equal, round, and reactive to light. Right eye exhibits no discharge. Left eye exhibits no discharge. No scleral icterus.  Neck: Normal range of motion. Neck supple. No thyromegaly present.  No carotid bruits  Cardiovascular: Normal rate, regular rhythm and normal heart sounds.  Exam reveals no gallop and no friction rub.   No murmur heard. Regular rate and rhythm at 72 per minute  Pulmonary/Chest: Effort normal and breath sounds normal. No respiratory distress. He has no wheezes. He has no rales. He exhibits no tenderness.  Abdominal: Soft. Bowel sounds are normal. He exhibits no mass. There is no tenderness. There is no rebound and no guarding.  Musculoskeletal: Normal range of motion. He  exhibits no edema and no tenderness.  Lymphadenopathy:    He has no cervical adenopathy.  Neurological: He is alert and oriented to person, place, and time. He has normal reflexes. No cranial nerve deficit.  Skin: Skin is warm and dry. No rash noted. No erythema. No pallor.  Psychiatric: He has a normal mood and affect. His behavior is normal. Judgment and thought content normal.   BP 126/75  Pulse 84  Temp(Src) 98 F (36.7  C) (Oral)  Ht 5' 11.25" (1.81 m)  Wt 209 lb (94.802 kg)  BMI 28.94 kg/m2  [facility] reading (PRIMARY) by  Dr. Brunilda Payor x-ray -no acute cardiovascular findings. Question nodule right upper lobe, arthritic changes in spine                                      Assessment & Plan:  1. GERD (gastroesophageal reflux disease) - DG Chest 2 View; Future - POCT CBC; Future  2. HYPERLIPIDEMIA - DG Chest 2 View; Future - POCT CBC; Future - NMR, lipoprofile; Future  3. HYPERTENSION - DG Chest 2 View; Future - POCT CBC; Future - Hepatic function panel; Future - BMP8+EGFR; Future  4. PROSTATE CANCER - DG Chest 2 View; Future - POCT CBC; Future  5. Vitamin D deficiency - DG Chest 2 View; Future - POCT CBC; Future - Vit D  25 hydroxy (rtn osteoporosis monitoring); Future  No orders of the defined types were placed in this encounter.   Patient Instructions  Continue current medications. Continue good therapeutic lifestyle changes which include good diet and exercise. Fall precautions discussed with patient. Schedule your flu vaccine if you haven't had it yet If you are over 8 years old - you may need Prevnar 4 or the adult Pneumonia vaccine. The Prevnar vaccine that you received today he may make your arm is sore. Return to clinic as planned for getting fasting blood work. Return the FOBT We will call you with the results of the chest x-ray once those results are available.   Arrie Senate MD

## 2014-01-25 NOTE — Patient Instructions (Addendum)
Continue current medications. Continue good therapeutic lifestyle changes which include good diet and exercise. Fall precautions discussed with patient. Schedule your flu vaccine if you haven't had it yet If you are over 78 years old - you may need Prevnar 21 or the adult Pneumonia vaccine. The Prevnar vaccine that you received today he may make your arm is sore. Return to clinic as planned for getting fasting blood work. Return the FOBT We will call you with the results of the chest x-ray once those results are available.

## 2014-01-26 ENCOUNTER — Telehealth: Payer: Self-pay

## 2014-01-26 NOTE — Telephone Encounter (Signed)
Pt aware of CXR results.

## 2014-01-26 NOTE — Telephone Encounter (Signed)
Message copied by Koren Bound on Fri Jan 26, 2014  2:24 PM ------      Message from: Chipper Herb      Created: Thu Jan 25, 2014  5:42 PM       As per radiology report ------

## 2014-01-29 ENCOUNTER — Other Ambulatory Visit (INDEPENDENT_AMBULATORY_CARE_PROVIDER_SITE_OTHER): Payer: Medicare Other

## 2014-01-29 DIAGNOSIS — C61 Malignant neoplasm of prostate: Secondary | ICD-10-CM

## 2014-01-29 DIAGNOSIS — E559 Vitamin D deficiency, unspecified: Secondary | ICD-10-CM | POA: Diagnosis not present

## 2014-01-29 DIAGNOSIS — E785 Hyperlipidemia, unspecified: Secondary | ICD-10-CM

## 2014-01-29 DIAGNOSIS — I10 Essential (primary) hypertension: Secondary | ICD-10-CM | POA: Diagnosis not present

## 2014-01-29 DIAGNOSIS — K219 Gastro-esophageal reflux disease without esophagitis: Secondary | ICD-10-CM

## 2014-01-29 DIAGNOSIS — H26499 Other secondary cataract, unspecified eye: Secondary | ICD-10-CM | POA: Diagnosis not present

## 2014-01-29 LAB — POCT CBC
Granulocyte percent: 70.7 %G (ref 37–80)
HCT, POC: 42.5 % — AB (ref 43.5–53.7)
Hemoglobin: 13.2 g/dL — AB (ref 14.1–18.1)
LYMPH, POC: 1.5 (ref 0.6–3.4)
MCH: 29 pg (ref 27–31.2)
MCHC: 31 g/dL — AB (ref 31.8–35.4)
MCV: 93.5 fL (ref 80–97)
MPV: 7.3 fL (ref 0–99.8)
PLATELET COUNT, POC: 304 10*3/uL (ref 142–424)
POC Granulocyte: 4.2 (ref 2–6.9)
POC LYMPH %: 24.6 % (ref 10–50)
RBC: 4.6 M/uL — AB (ref 4.69–6.13)
RDW, POC: 13.9 %
WBC: 6 10*3/uL (ref 4.6–10.2)

## 2014-01-29 NOTE — Addendum Note (Signed)
Addended by: Zannie Cove on: 01/29/2014 02:38 PM   Modules accepted: Orders

## 2014-01-29 NOTE — Progress Notes (Signed)
Pt came in for labs only 

## 2014-01-31 LAB — BMP8+EGFR
BUN/Creatinine Ratio: 13 (ref 10–22)
BUN: 11 mg/dL (ref 8–27)
CALCIUM: 9 mg/dL (ref 8.6–10.2)
CO2: 25 mmol/L (ref 18–29)
CREATININE: 0.84 mg/dL (ref 0.76–1.27)
Chloride: 103 mmol/L (ref 97–108)
GFR calc Af Amer: 90 mL/min/{1.73_m2} (ref >59)
GFR calc non Af Amer: 78 mL/min/{1.73_m2} (ref >59)
Glucose: 109 mg/dL — ABNORMAL HIGH (ref 65–99)
Potassium: 4.2 mmol/L (ref 3.5–5.2)
Sodium: 144 mmol/L (ref 134–144)

## 2014-01-31 LAB — HEPATIC FUNCTION PANEL
ALT: 18 IU/L (ref 0–44)
AST: 19 IU/L (ref 0–40)
Albumin: 3.9 g/dL (ref 3.5–4.7)
Alkaline Phosphatase: 76 IU/L (ref 39–117)
Bilirubin, Direct: 0.17 mg/dL (ref 0.00–0.40)
Total Bilirubin: 0.5 mg/dL (ref 0.0–1.2)
Total Protein: 6.4 g/dL (ref 6.0–8.5)

## 2014-01-31 LAB — NMR, LIPOPROFILE
Cholesterol: 143 mg/dL (ref <200)
HDL CHOLESTEROL BY NMR: 41 mg/dL (ref >=40)
HDL Particle Number: 27.8 umol/L — ABNORMAL LOW (ref >=30.5)
LDL Particle Number: 1135 nmol/L — ABNORMAL HIGH (ref <1000)
LDL Size: 20.8 nm (ref >20.5)
LDLC SERPL CALC-MCNC: 80 mg/dL (ref <100)
LP-IR Score: 45 (ref <=45)
Small LDL Particle Number: 560 nmol/L — ABNORMAL HIGH (ref <=527)
Triglycerides by NMR: 109 mg/dL (ref <150)

## 2014-01-31 LAB — VITAMIN D 25 HYDROXY (VIT D DEFICIENCY, FRACTURES): VIT D 25 HYDROXY: 39.7 ng/mL (ref 30.0–100.0)

## 2014-02-12 ENCOUNTER — Other Ambulatory Visit: Payer: Self-pay | Admitting: Family Medicine

## 2014-02-23 ENCOUNTER — Other Ambulatory Visit: Payer: Self-pay | Admitting: Family Medicine

## 2014-05-02 ENCOUNTER — Encounter: Payer: Self-pay | Admitting: Family Medicine

## 2014-05-02 ENCOUNTER — Ambulatory Visit (INDEPENDENT_AMBULATORY_CARE_PROVIDER_SITE_OTHER): Payer: Medicare Other

## 2014-05-02 ENCOUNTER — Ambulatory Visit (INDEPENDENT_AMBULATORY_CARE_PROVIDER_SITE_OTHER): Payer: Medicare Other | Admitting: Family Medicine

## 2014-05-02 VITALS — BP 132/70 | HR 72 | Temp 97.3°F | Ht 71.25 in | Wt 210.0 lb

## 2014-05-02 DIAGNOSIS — M25569 Pain in unspecified knee: Secondary | ICD-10-CM

## 2014-05-02 DIAGNOSIS — M25562 Pain in left knee: Secondary | ICD-10-CM

## 2014-05-02 MED ORDER — MELOXICAM 7.5 MG PO TABS: 7.5000 mg | ORAL_TABLET | Freq: Every day | ORAL | Status: DC

## 2014-05-02 NOTE — Progress Notes (Signed)
Subjective:    Patient ID: Alec Davis, male    DOB: 07-17-1923, 80 y.o.   MRN: [id number]  HPI Patient here today for left knee pain that started after a trip and flight to [place]. Xray done today. There is no history of any injury or strain that the patient is aware of. He is hurting in the infrapatellar area.     Patient Active Problem List   Diagnosis Date Noted  . Hyperlipemia 08/03/2013  . Depression 08/03/2013  . Vitamin D deficiency 03/07/2011  . GERD (gastroesophageal reflux disease) 03/07/2011  . PROSTATE CANCER 01/19/2011  . HYPERLIPIDEMIA 01/19/2011  . HYPERTENSION 01/19/2011  . RIGHT BUNDLE BRANCH BLOCK 01/19/2011  . INTERNAL HEMORRHOIDS 01/19/2011  . DIVERTICULOSIS, COLON 01/19/2011  . COLONIC POLYPS, HX OF 01/19/2011   Outpatient Encounter Prescriptions as of 05/02/2014  Medication Sig  . aspirin 81 MG EC tablet Take 81 mg by mouth daily after supper.    . Calcium-Magnesium-Vitamin D (CITRACAL CALCIUM+D) 600-40-500 MG-MG-UNIT TB24 Take 2 tablets by mouth 1 dose over 46 hours.    . lovastatin (MEVACOR) 20 MG tablet TAKE ONE TABLET BY MOUTH ONE TIME DAILY  . multivitamin (THERAGRAN) per tablet Take 1 tablet by mouth daily.    . niacin (NIASPAN) 750 MG CR tablet TAKE ONE TABLET BY MOUTH ONE TIME DAILY  . tamsulosin (FLOMAX) 0.4 MG CAPS capsule TAKE 1 CAPSULE (0.4 MG TOTAL) BY MOUTH DAILY.    Review of Systems  Constitutional: Negative.   HENT: Negative.   Eyes: Negative.   Respiratory: Negative.   Cardiovascular: Negative.   Gastrointestinal: Negative.   Endocrine: Negative.   Genitourinary: Negative.   Musculoskeletal: Positive for arthralgias (left knee pain).  Skin: Negative.   Allergic/Immunologic: Negative.   Neurological: Negative.   Hematological: Negative.   Psychiatric/Behavioral: Negative.        Objective:   Physical Exam  Constitutional: He is oriented to person, place, and time. He appears well-developed and well-nourished. No  distress.  HENT:  Head: Normocephalic.  Musculoskeletal: Normal range of motion. He exhibits no edema and no tenderness.  There was no joint line tenderness with palpation. There was no rubor or erythema. There was no sign of any edema. There was no point tenderness around the infra  or supra-patella ligaments.  Neurological: He is alert and oriented to person, place, and time.  Skin: Skin is warm. No rash noted.  Psychiatric: He has a normal mood and affect. His behavior is normal. Judgment and thought content normal.   BP 132/70  Pulse 72  Temp(Src) 97.3 F (36.3 C) (Oral)  Ht 5' 11.25" (1.81 m)  Wt 210 lb (95.255 kg)  BMI 29.08 kg/m2  [facility] reading (PRIMARY) by  Dr. Louretta Parma knee--degenerative changes                                         Assessment & Plan:  1. Left knee pain - DG Knee 1-2 Views Left; Future Meds ordered this encounter  Medications  . meloxicam (MOBIC) 7.5 MG tablet    Sig: Take 1 tablet (7.5 mg total) by mouth daily.    Dispense:  14 tablet    Refill:  0   Patient Instructions  Take Mobic 7.5 after breakfast x 14 days (HOLD  ibu WHILE ON THIS MED) Call us first of next week for an update. Use warm wet compresses several  times a day  Do not take the Aleve while you are taking the meloxicam   Arrie Senate MD

## 2014-05-02 NOTE — Patient Instructions (Addendum)
Take Mobic 7.5 after breakfast x 14 days (HOLD  ibu WHILE ON THIS MED) Call us first of next week for an update. Use warm wet compresses several times a day  Do not take the Aleve while you are taking the meloxicam

## 2014-05-03 ENCOUNTER — Telehealth: Payer: Self-pay

## 2014-05-03 NOTE — Telephone Encounter (Signed)
Wife aware of x-ray results

## 2014-05-03 NOTE — Telephone Encounter (Signed)
Message copied by Koren Bound on Thu May 03, 2014 12:39 PM ------      Message from: Chipper Herb      Created: Wed May 02, 2014  5:19 PM       As per radiology report ------

## 2014-05-08 ENCOUNTER — Telehealth: Payer: Self-pay | Admitting: Family Medicine

## 2014-05-09 NOTE — Telephone Encounter (Signed)
This is an FYI for you review...Marland KitchenMarland Kitchen

## 2014-05-30 ENCOUNTER — Other Ambulatory Visit (INDEPENDENT_AMBULATORY_CARE_PROVIDER_SITE_OTHER): Payer: Medicare Other

## 2014-05-30 DIAGNOSIS — I10 Essential (primary) hypertension: Secondary | ICD-10-CM | POA: Diagnosis not present

## 2014-05-30 DIAGNOSIS — E785 Hyperlipidemia, unspecified: Secondary | ICD-10-CM

## 2014-05-30 DIAGNOSIS — E559 Vitamin D deficiency, unspecified: Secondary | ICD-10-CM

## 2014-05-30 LAB — POCT CBC
GRANULOCYTE PERCENT: 72.2 % (ref 37–80)
HCT, POC: 44.6 % (ref 43.5–53.7)
Hemoglobin: 14.2 g/dL (ref 14.1–18.1)
Lymph, poc: 1.5 (ref 0.6–3.4)
MCH, POC: 30.3 pg (ref 27–31.2)
MCHC: 32 g/dL (ref 31.8–35.4)
MCV: 94.9 fL (ref 80–97)
MPV: 6.7 fL (ref 0–99.8)
PLATELET COUNT, POC: 262 10*3/uL (ref 142–424)
POC GRANULOCYTE: 4.9 (ref 2–6.9)
POC LYMPH PERCENT: 21.8 %L (ref 10–50)
RBC: 4.7 M/uL (ref 4.69–6.13)
RDW, POC: 13.1 %
WBC: 6.8 10*3/uL (ref 4.6–10.2)

## 2014-05-31 ENCOUNTER — Ambulatory Visit (INDEPENDENT_AMBULATORY_CARE_PROVIDER_SITE_OTHER): Payer: Medicare Other | Admitting: Family Medicine

## 2014-05-31 ENCOUNTER — Encounter: Payer: Self-pay | Admitting: Family Medicine

## 2014-05-31 VITALS — BP 120/73 | HR 73 | Temp 96.6°F | Ht 71.25 in | Wt 211.0 lb

## 2014-05-31 DIAGNOSIS — F3289 Other specified depressive episodes: Secondary | ICD-10-CM

## 2014-05-31 DIAGNOSIS — E785 Hyperlipidemia, unspecified: Secondary | ICD-10-CM

## 2014-05-31 DIAGNOSIS — C61 Malignant neoplasm of prostate: Secondary | ICD-10-CM

## 2014-05-31 DIAGNOSIS — F32A Depression, unspecified: Secondary | ICD-10-CM

## 2014-05-31 DIAGNOSIS — F329 Major depressive disorder, single episode, unspecified: Secondary | ICD-10-CM | POA: Diagnosis not present

## 2014-05-31 DIAGNOSIS — I10 Essential (primary) hypertension: Secondary | ICD-10-CM | POA: Diagnosis not present

## 2014-05-31 LAB — BMP8+EGFR
BUN/Creatinine Ratio: 15 (ref 10–22)
BUN: 14 mg/dL (ref 8–27)
CALCIUM: 9.2 mg/dL (ref 8.6–10.2)
CO2: 26 mmol/L (ref 18–29)
CREATININE: 0.92 mg/dL (ref 0.76–1.27)
Chloride: 104 mmol/L (ref 97–108)
GFR calc Af Amer: 86 mL/min/{1.73_m2} (ref >59)
GFR, EST NON AFRICAN AMERICAN: 74 mL/min/{1.73_m2} (ref >59)
GLUCOSE: 104 mg/dL — AB (ref 65–99)
Potassium: 4.7 mmol/L (ref 3.5–5.2)
Sodium: 144 mmol/L (ref 134–144)

## 2014-05-31 LAB — HEPATIC FUNCTION PANEL
ALBUMIN: 4.1 g/dL (ref 3.5–4.7)
ALT: 16 IU/L (ref 0–44)
AST: 21 IU/L (ref 0–40)
Alkaline Phosphatase: 68 IU/L (ref 39–117)
BILIRUBIN TOTAL: 0.5 mg/dL (ref 0.0–1.2)
Bilirubin, Direct: 0.15 mg/dL (ref 0.00–0.40)
Total Protein: 6.5 g/dL (ref 6.0–8.5)

## 2014-05-31 LAB — NMR, LIPOPROFILE
Cholesterol: 147 mg/dL (ref 100–199)
HDL CHOLESTEROL BY NMR: 48 mg/dL (ref >39)
HDL Particle Number: 30.9 umol/L (ref >=30.5)
LDL Particle Number: 952 nmol/L (ref <1000)
LDL SIZE: 21.1 nm (ref >20.5)
LDLC SERPL CALC-MCNC: 83 mg/dL (ref 0–99)
LP-IR Score: 29 (ref <=45)
Small LDL Particle Number: 432 nmol/L (ref <=527)
TRIGLYCERIDES BY NMR: 81 mg/dL (ref 0–149)

## 2014-05-31 LAB — VITAMIN D 25 HYDROXY (VIT D DEFICIENCY, FRACTURES): VIT D 25 HYDROXY: 34.7 ng/mL (ref 30.0–100.0)

## 2014-05-31 NOTE — Progress Notes (Signed)
   Subjective:    Patient ID: Alec Davis, male    DOB: [birth year], 80 y.o.   MRN: [id number]  HPI Patient come in today for 4 month f/u on chronic medical conditions. Discussed with patient today his long term care plans along with his wife in this discussion lasted 15-20 minutes. His recent labs were reviewed with him and they were all excellent and within normal limits. He is going to see his urologist on Monday.   Review of Systems  Musculoskeletal: Positive for arthralgias (chronic left knee pain. managed with mobic.).  All other systems reviewed and are negative.      Objective:   Physical Exam  Nursing note and vitals reviewed. Constitutional: He is oriented to person, place, and time. He appears well-developed and well-nourished. No distress.  HENT:  Head: Normocephalic and atraumatic.  Right Ear: External ear normal.  Left Ear: External ear normal.  Nose: Nose normal.  Mouth/Throat: Oropharynx is clear and moist. No oropharyngeal exudate.  Eyes: Conjunctivae and EOM are normal. Pupils are equal, round, and reactive to light. Right eye exhibits no discharge. Left eye exhibits no discharge. No scleral icterus.  Neck: Normal range of motion. Neck supple. No thyromegaly present.  Cardiovascular: Normal rate, regular rhythm, normal heart sounds and intact distal pulses.  Exam reveals no gallop and no friction rub.   No murmur heard. At 84 per minute  Pulmonary/Chest: Effort normal and breath sounds normal. No respiratory distress. He has no wheezes. He has no rales. He exhibits no tenderness.  Abdominal: Soft. Bowel sounds are normal. He exhibits no mass. There is no tenderness. There is no rebound and no guarding.  Musculoskeletal: Normal range of motion. He exhibits no edema and no tenderness.  Good leg raising bilaterally the left knee was slightly more swollen but nontender at the joint lines with good flexion, there was no rubor  Lymphadenopathy:    He has no cervical  adenopathy.  Neurological: He is alert and oriented to person, place, and time. He has normal reflexes. No cranial nerve deficit.  Skin: Skin is warm and dry. No rash noted.  Psychiatric: He has a normal mood and affect. His behavior is normal. Judgment and thought content normal.   BP 120/73  Pulse 73  Temp(Src) 96.6 F (35.9 C) (Oral)  Ht 5' 11.25" (1.81 m)  Wt 211 lb (95.709 kg)  BMI 29.21 kg/m2        Assessment & Plan:  1. HYPERTENSION  2. Hyperlipemia  3. Depression  4. PROSTATE CANCER  Patient Instructions  Continue current medications. Continue good therapeutic lifestyle changes which include good diet and exercise. Fall precautions discussed with patient. If an FOBT was given today- please return it to our front desk. If you are over 85 years old - you may need Prevnar 80 or the adult Pneumonia vaccine.  No climbing, no cleaning out gutters.    Arrie Senate MD

## 2014-05-31 NOTE — Patient Instructions (Addendum)
Continue current medications. Continue good therapeutic lifestyle changes which include good diet and exercise. Fall precautions discussed with patient. If an FOBT was given today- please return it to our front desk. If you are over 78 years old - you may need Prevnar 28 or the adult Pneumonia vaccine.  No climbing, no cleaning out gutters.

## 2014-06-04 DIAGNOSIS — N320 Bladder-neck obstruction: Secondary | ICD-10-CM | POA: Diagnosis not present

## 2014-06-04 DIAGNOSIS — C61 Malignant neoplasm of prostate: Secondary | ICD-10-CM | POA: Diagnosis not present

## 2014-06-13 ENCOUNTER — Other Ambulatory Visit: Payer: Medicare Other

## 2014-06-13 DIAGNOSIS — Z1212 Encounter for screening for malignant neoplasm of rectum: Secondary | ICD-10-CM

## 2014-06-13 NOTE — Addendum Note (Signed)
Addended by: Pollyann Kennedy F on: 06/13/2014 12:45 PM   Modules accepted: Orders

## 2014-06-14 LAB — FECAL OCCULT BLOOD, IMMUNOCHEMICAL: FECAL OCCULT BLD: NEGATIVE

## 2014-07-04 ENCOUNTER — Ambulatory Visit (INDEPENDENT_AMBULATORY_CARE_PROVIDER_SITE_OTHER): Payer: Medicare Other | Admitting: Family Medicine

## 2014-07-04 VITALS — BP 127/79 | HR 74 | Temp 96.9°F | Ht 71.25 in | Wt 210.0 lb

## 2014-07-04 DIAGNOSIS — M25569 Pain in unspecified knee: Secondary | ICD-10-CM | POA: Diagnosis not present

## 2014-07-04 DIAGNOSIS — M25562 Pain in left knee: Secondary | ICD-10-CM

## 2014-07-04 NOTE — Progress Notes (Signed)
   Subjective:    Patient ID: Alec Davis, male    DOB: Mar 03, 1923, 80 y.o.   MRN: [id number]  HPI  This 80 y.o. male presents for evaluation of left knee pain.  He takes meloxicam.  The discomfort has become progressively worse.  Review of Systems C/o left knee pain   No chest pain, SOB, HA, dizziness, vision change, N/V, diarrhea, constipation, dysuria, urinary urgency or frequency or rash.  Objective:   Physical Exam  Vital signs noted  Well developed well nourished male.  HEENT - Head atraumatic Normocephalic                Eyes - PERRLA, Conjuctiva - clear Sclera- Clear EOMI                Ears - EAC's Wnl TM's Wnl Gross Hearing WNL                Throat - oropharanx wnl Respiratory - Lungs CTA bilateral Cardiac - RRR S1 and S2 without murmur GI - Abdomen soft Nontender and bowel sounds active x 4 MS - TTP left knee    Procedure - left knee injected with 2 cc's of lidocaine and one cc of depomedrol Assessment & Plan:  Left knee pain - Knee injection and follow up prn  Lysbeth Penner FNP

## 2014-07-06 ENCOUNTER — Ambulatory Visit (INDEPENDENT_AMBULATORY_CARE_PROVIDER_SITE_OTHER): Payer: Medicare Other | Admitting: Family Medicine

## 2014-07-06 VITALS — BP 151/78 | HR 80 | Temp 96.3°F | Ht 71.25 in | Wt 210.0 lb

## 2014-07-06 DIAGNOSIS — M25562 Pain in left knee: Secondary | ICD-10-CM

## 2014-07-06 DIAGNOSIS — M25569 Pain in unspecified knee: Secondary | ICD-10-CM | POA: Diagnosis not present

## 2014-07-06 MED ORDER — OXYCODONE HCL 5 MG PO TABS: 5.0000 mg | ORAL_TABLET | Freq: Four times a day (QID) | ORAL | Status: DC | PRN

## 2014-07-06 NOTE — Progress Notes (Signed)
   Subjective:    Patient ID: Alec Davis, male    DOB: Jun 09, 1923, 80 y.o.   MRN: [id number]  HPI Work in visit today for this gentleman who was seen yesterday for left knee pain and received an injection. He had been also seen recently and placed on meloxicam. Review of his knee x-ray showed degenerative changes. Pain is worse today and he is having a difficult time with ambulation. There is no history of trauma or injury.   Review of Systems  Constitutional: Negative.  Negative for diaphoresis.  Musculoskeletal: Positive for arthralgias and gait problem (Maked limp).       Objective:   Physical Exam Exam confined to the left knee. There is some mild crepitance on flexion and extension there is no joint line tenderness no instability to varus and valgus stress. 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 He would not likely be a candidate for surgery but I think we need to be more aggressive with his pain so that he enjoys some quality of life and is able to function  I have suggested that he acquire a cane we demonstrated how to use a cane while he was here with a small crutch. Rx: Oxycodone 5 mg 1 every 6 as necessary with food Wardell Honour MD

## 2014-07-06 NOTE — Patient Instructions (Signed)

## 2014-07-09 ENCOUNTER — Telehealth: Payer: Self-pay | Admitting: *Deleted

## 2014-07-09 DIAGNOSIS — M25569 Pain in unspecified knee: Principal | ICD-10-CM

## 2014-07-09 DIAGNOSIS — G8929 Other chronic pain: Secondary | ICD-10-CM

## 2014-07-09 NOTE — Telephone Encounter (Signed)
Called to check on patient's knee pain. Wife reports no improvement. He has tried ice and heat as well as 2 doses of Oxycodone 5mg . Will forward to Dr. Sabra Heck for suggestions.

## 2014-07-09 NOTE — Telephone Encounter (Signed)
Would suggest ortho referral

## 2014-07-10 NOTE — Telephone Encounter (Signed)
Patient aware referral put in

## 2014-07-11 ENCOUNTER — Telehealth: Payer: Self-pay | Admitting: Family Medicine

## 2014-07-26 DIAGNOSIS — M25569 Pain in unspecified knee: Secondary | ICD-10-CM | POA: Diagnosis not present

## 2014-07-30 ENCOUNTER — Other Ambulatory Visit: Payer: Self-pay | Admitting: Family Medicine

## 2014-08-02 DIAGNOSIS — M2390 Unspecified internal derangement of unspecified knee: Secondary | ICD-10-CM | POA: Diagnosis not present

## 2014-08-02 DIAGNOSIS — M23349 Other meniscus derangements, anterior horn of lateral meniscus, unspecified knee: Secondary | ICD-10-CM | POA: Diagnosis not present

## 2014-08-02 DIAGNOSIS — IMO0002 Reserved for concepts with insufficient information to code with codable children: Secondary | ICD-10-CM | POA: Diagnosis not present

## 2014-08-08 ENCOUNTER — Other Ambulatory Visit: Payer: Self-pay | Admitting: Family Medicine

## 2014-08-14 DIAGNOSIS — M1710 Unilateral primary osteoarthritis, unspecified knee: Secondary | ICD-10-CM | POA: Diagnosis not present

## 2014-08-28 ENCOUNTER — Telehealth: Payer: Self-pay | Admitting: Family Medicine

## 2014-08-28 DIAGNOSIS — H9190 Unspecified hearing loss, unspecified ear: Secondary | ICD-10-CM

## 2014-08-28 NOTE — Telephone Encounter (Signed)
Patient's wife has noticed an increase in his hearing deficit over the past month.

## 2014-08-29 ENCOUNTER — Other Ambulatory Visit: Payer: Self-pay | Admitting: Family Medicine

## 2014-09-12 DIAGNOSIS — H908 Mixed conductive and sensorineural hearing loss, unspecified: Secondary | ICD-10-CM | POA: Diagnosis not present

## 2014-09-12 DIAGNOSIS — H903 Sensorineural hearing loss, bilateral: Secondary | ICD-10-CM | POA: Diagnosis not present

## 2014-09-12 DIAGNOSIS — H905 Unspecified sensorineural hearing loss: Secondary | ICD-10-CM | POA: Diagnosis not present

## 2014-09-26 DIAGNOSIS — Z23 Encounter for immunization: Secondary | ICD-10-CM | POA: Diagnosis not present

## 2014-09-27 DIAGNOSIS — M1712 Unilateral primary osteoarthritis, left knee: Secondary | ICD-10-CM | POA: Diagnosis not present

## 2014-11-01 ENCOUNTER — Other Ambulatory Visit: Payer: Self-pay | Admitting: Family Medicine

## 2014-11-27 ENCOUNTER — Encounter: Payer: Self-pay | Admitting: Family Medicine

## 2014-11-27 ENCOUNTER — Ambulatory Visit (INDEPENDENT_AMBULATORY_CARE_PROVIDER_SITE_OTHER): Payer: Medicare Other | Admitting: Family Medicine

## 2014-11-27 VITALS — BP 140/76 | HR 71 | Temp 96.6°F | Ht 71.25 in | Wt 210.0 lb

## 2014-11-27 DIAGNOSIS — E785 Hyperlipidemia, unspecified: Secondary | ICD-10-CM | POA: Diagnosis not present

## 2014-11-27 DIAGNOSIS — H9190 Unspecified hearing loss, unspecified ear: Secondary | ICD-10-CM | POA: Diagnosis not present

## 2014-11-27 DIAGNOSIS — E559 Vitamin D deficiency, unspecified: Secondary | ICD-10-CM

## 2014-11-27 DIAGNOSIS — C61 Malignant neoplasm of prostate: Secondary | ICD-10-CM

## 2014-11-27 DIAGNOSIS — K219 Gastro-esophageal reflux disease without esophagitis: Secondary | ICD-10-CM

## 2014-11-27 DIAGNOSIS — N400 Enlarged prostate without lower urinary tract symptoms: Secondary | ICD-10-CM | POA: Diagnosis not present

## 2014-11-27 NOTE — Progress Notes (Signed)
Subjective:    Patient ID: Alec Davis, male    DOB: 12/09/1923, 80 y.o.   MRN: [id number]  HPI Pt here for follow up and management of chronic medical problems. The patient's wife comes with him to the visit today. The patient as usual has no complaints. His active problem list is as below.  Patient Active Problem List   Diagnosis Date Noted  . Hyperlipemia 08/03/2013  . Depression 08/03/2013  . Vitamin D deficiency 03/07/2011  . GERD (gastroesophageal reflux disease) 03/07/2011  . PROSTATE CANCER 01/19/2011  . HYPERTENSION 01/19/2011  . RIGHT BUNDLE BRANCH BLOCK 01/19/2011  . INTERNAL HEMORRHOIDS 01/19/2011  . DIVERTICULOSIS, COLON 01/19/2011  . COLONIC POLYPS, HX OF 01/19/2011   Outpatient Encounter Prescriptions as of 11/27/2014  Medication Sig  . aspirin 81 MG EC tablet Take 81 mg by mouth daily after supper.    . Calcium-Magnesium-Vitamin D (CITRACAL CALCIUM+D) 600-40-500 MG-MG-UNIT TB24 Take 2 tablets by mouth 1 dose over 46 hours.    . lovastatin (MEVACOR) 20 MG tablet TAKE ONE TABLET BY MOUTH ONE  TIME DAILY  . meloxicam (MOBIC) 7.5 MG tablet Take 1 tablet (7.5 mg total) by mouth daily.  . multivitamin (THERAGRAN) per tablet Take 1 tablet by mouth daily.    . niacin (NIASPAN) 750 MG CR tablet TAKE ONE TABLET BY MOUTH ONE TIME DAILY  . oxyCODONE (OXY IR/ROXICODONE) 5 MG immediate release tablet Take 1 tablet (5 mg total) by mouth every 6 (six) hours as needed for severe pain.  . tamsulosin (FLOMAX) 0.4 MG CAPS capsule TAKE ONE CAPSULE BY MOUTH ONE TIME DAILY    Review of Systems  Constitutional: Negative.   HENT: Negative.   Eyes: Negative.   Respiratory: Negative.   Cardiovascular: Negative.   Gastrointestinal: Negative.   Endocrine: Negative.   Genitourinary: Negative.   Musculoskeletal: Negative.   Skin: Negative.   Allergic/Immunologic: Negative.   Neurological: Negative.   Hematological: Negative.   Psychiatric/Behavioral: Negative.          Objective:   Physical Exam  Constitutional: He is oriented to person, place, and time. He appears well-developed and well-nourished. No distress.  Pleasant and younger appearing than his stated age of 50  HENT:  Head: Normocephalic and atraumatic.  Right Ear: External ear normal.  Left Ear: External ear normal.  Nose: Nose normal.  Mouth/Throat: Oropharynx is clear and moist. No oropharyngeal exudate.  Bilateral hearing aids and is cerumen bilaterally left greater than right  Eyes: Conjunctivae and EOM are normal. Pupils are equal, round, and reactive to light. Right eye exhibits no discharge. Left eye exhibits no discharge. No scleral icterus.  Neck: Normal range of motion. Neck supple. No thyromegaly present.  Cardiovascular: Normal rate, regular rhythm and normal heart sounds.   No murmur heard. At 72/m  Pulmonary/Chest: Effort normal and breath sounds normal. No respiratory distress. He has no wheezes. He has no rales. He exhibits no tenderness.  Abdominal: Soft. Bowel sounds are normal. He exhibits no mass. There is no tenderness. There is no rebound and no guarding.  No inguinal nodes  Musculoskeletal: Normal range of motion. He exhibits no edema or tenderness.  Lymphadenopathy:    He has no cervical adenopathy.  Neurological: He is alert and oriented to person, place, and time. He has normal reflexes. No cranial nerve deficit.  Skin: Skin is warm and dry. No rash noted. No erythema. No pallor.  Psychiatric: He has a normal mood and affect. His behavior is normal. Judgment  and thought content normal.  Nursing note and vitals reviewed.   BP 140/76 mmHg  Pulse 71  Temp(Src) 96.6 F (35.9 C) (Oral)  Ht 5' 11.25" (1.81 m)  Wt 210 lb (95.255 kg)  BMI 29.08 kg/m2       Assessment & Plan:  1. Hyperlipemia - POCT CBC; Future - BMP8+EGFR; Future - Hepatic function panel; Future - NMR, lipoprofile; Future  2. Vitamin D deficiency - POCT CBC; Future - Vit D  25 hydroxy  (rtn osteoporosis monitoring); Future  3. Gastroesophageal reflux disease, esophagitis presence not specified - POCT CBC; Future - Hepatic function panel; Future  4. BPH (benign prostatic hypertrophy) - POCT CBC; Future  5. PROSTATE CANCER - POCT CBC; Future -Continue to follow-up with Dr. Rosana Hoes the urologist  6. Hearing loss, unspecified laterality - POCT CBC; Future  No orders of the defined types were placed in this encounter.   Arrie Senate MD

## 2014-11-27 NOTE — Patient Instructions (Signed)
Medicare Annual Wellness Visit  [facility] and the medical providers at [facility] strive to bring you the best medical care.  In doing so we not only want to address your current medical conditions and concerns but also to detect new conditions early and prevent illness, disease and health-related problems.    Medicare offers a yearly Wellness Visit which allows our clinical staff to assess your need for preventative services including immunizations, lifestyle education, counseling to decrease risk of preventable diseases and screening for fall risk and other medical concerns.    This visit is provided free of charge (no copay) for all Medicare recipients. The clinical pharmacists at [facility] have begun to conduct these Wellness Visits which will also include a thorough review of all your medications.    As you primary medical provider recommend that you make an appointment for your Annual Wellness Visit if you have not done so already this year.  You may set up this appointment before you leave today or you may call back ([phone]) and schedule an appointment.  Please make sure when you call that you mention that you are scheduling your Annual Wellness Visit with the clinical pharmacist so that the appointment may be made for the proper length of time.     Continue current medications. Continue good therapeutic lifestyle changes which include good diet and exercise. Fall precautions discussed with patient. If an FOBT was given today- please return it to our front desk. If you are over 50 years old - you may need Prevnar 13 or the adult Pneumonia vaccine.  Flu Shots will be available at our office starting mid- September. Please call and schedule a FLU CLINIC APPOINTMENT.    

## 2014-11-29 ENCOUNTER — Other Ambulatory Visit (INDEPENDENT_AMBULATORY_CARE_PROVIDER_SITE_OTHER): Payer: Medicare Other

## 2014-11-29 DIAGNOSIS — N400 Enlarged prostate without lower urinary tract symptoms: Secondary | ICD-10-CM

## 2014-11-29 DIAGNOSIS — C61 Malignant neoplasm of prostate: Secondary | ICD-10-CM | POA: Diagnosis not present

## 2014-11-29 DIAGNOSIS — E785 Hyperlipidemia, unspecified: Secondary | ICD-10-CM | POA: Diagnosis not present

## 2014-11-29 DIAGNOSIS — H9190 Unspecified hearing loss, unspecified ear: Secondary | ICD-10-CM | POA: Diagnosis not present

## 2014-11-29 DIAGNOSIS — E559 Vitamin D deficiency, unspecified: Secondary | ICD-10-CM | POA: Diagnosis not present

## 2014-11-29 DIAGNOSIS — K219 Gastro-esophageal reflux disease without esophagitis: Secondary | ICD-10-CM | POA: Diagnosis not present

## 2014-11-29 LAB — POCT CBC
GRANULOCYTE PERCENT: 68.7 % (ref 37–80)
HCT, POC: 43.2 % — AB (ref 43.5–53.7)
Hemoglobin: 14.4 g/dL (ref 14.1–18.1)
Lymph, poc: 1.7 (ref 0.6–3.4)
MCH, POC: 31.6 pg — AB (ref 27–31.2)
MCHC: 33.3 g/dL (ref 31.8–35.4)
MCV: 95 fL (ref 80–97)
MPV: 6.9 fL (ref 0–99.8)
POC Granulocyte: 4.5 (ref 2–6.9)
POC LYMPH PERCENT: 25.5 %L (ref 10–50)
Platelet Count, POC: 283 10*3/uL (ref 142–424)
RBC: 4.6 M/uL — AB (ref 4.69–6.13)
RDW, POC: 12.6 %
WBC: 6.6 10*3/uL (ref 4.6–10.2)

## 2014-11-29 NOTE — Progress Notes (Signed)
LAB ONLY 

## 2014-11-30 LAB — HEPATIC FUNCTION PANEL
ALT: 15 IU/L (ref 0–44)
AST: 22 IU/L (ref 0–40)
Albumin: 4.2 g/dL (ref 3.5–4.7)
Alkaline Phosphatase: 74 IU/L (ref 39–117)
BILIRUBIN TOTAL: 0.6 mg/dL (ref 0.0–1.2)
Bilirubin, Direct: 0.17 mg/dL (ref 0.00–0.40)
TOTAL PROTEIN: 6.6 g/dL (ref 6.0–8.5)

## 2014-11-30 LAB — BMP8+EGFR
BUN / CREAT RATIO: 12 (ref 10–22)
BUN: 11 mg/dL (ref 8–27)
CALCIUM: 9.3 mg/dL (ref 8.6–10.2)
CO2: 27 mmol/L (ref 18–29)
CREATININE: 0.95 mg/dL (ref 0.76–1.27)
Chloride: 101 mmol/L (ref 97–108)
GFR calc Af Amer: 82 mL/min/{1.73_m2} (ref >59)
GFR calc non Af Amer: 71 mL/min/{1.73_m2} (ref >59)
Glucose: 103 mg/dL — ABNORMAL HIGH (ref 65–99)
Potassium: 4.6 mmol/L (ref 3.5–5.2)
Sodium: 140 mmol/L (ref 134–144)

## 2014-11-30 LAB — VITAMIN D 25 HYDROXY (VIT D DEFICIENCY, FRACTURES): Vit D, 25-Hydroxy: 33.2 ng/mL (ref 30.0–100.0)

## 2014-11-30 LAB — NMR, LIPOPROFILE
CHOLESTEROL: 146 mg/dL (ref 100–199)
HDL CHOLESTEROL BY NMR: 47 mg/dL (ref >39)
HDL Particle Number: 31.7 umol/L (ref >=30.5)
LDL Particle Number: 969 nmol/L (ref <1000)
LDL Size: 21 nm (ref >20.5)
LDL-C: 78 mg/dL (ref 0–99)
LP-IR Score: 40 (ref <=45)
SMALL LDL PARTICLE NUMBER: 456 nmol/L (ref <=527)
TRIGLYCERIDES BY NMR: 105 mg/dL (ref 0–149)

## 2014-12-03 ENCOUNTER — Telehealth: Payer: Self-pay | Admitting: *Deleted

## 2014-12-03 NOTE — Telephone Encounter (Signed)
-----   Message from Chipper Herb, MD sent at 11/30/2014  1:45 PM EST ----- The blood sugar slightly elevated at 103. The creatinine, the most important kidney function test is within normal limits. The electrolytes including potassium are within normal limits. All liver function tests are within normal limits All cholesterol numbers with advanced lipid testing are excellent and at goal.----- continue current treatment and is aggressive therapeutic lifestyle changes as possible The vitamin D level is at the low end of the normal range.----Please make sure that the patient is taking vitamin D3 1000, 1 daily.

## 2014-12-03 NOTE — Telephone Encounter (Signed)
Details of good lab results left on VM.

## 2014-12-05 ENCOUNTER — Other Ambulatory Visit: Payer: Self-pay | Admitting: *Deleted

## 2014-12-05 MED ORDER — NIACIN ER (ANTIHYPERLIPIDEMIC) 750 MG PO TBCR: 750.0000 mg | EXTENDED_RELEASE_TABLET | Freq: Every day | ORAL | Status: DC

## 2014-12-05 MED ORDER — TAMSULOSIN HCL 0.4 MG PO CAPS: 0.4000 mg | ORAL_CAPSULE | Freq: Every day | ORAL | Status: DC

## 2015-01-22 DIAGNOSIS — H903 Sensorineural hearing loss, bilateral: Secondary | ICD-10-CM | POA: Diagnosis not present

## 2015-02-06 ENCOUNTER — Other Ambulatory Visit: Payer: Self-pay | Admitting: Family Medicine

## 2015-04-08 ENCOUNTER — Other Ambulatory Visit (INDEPENDENT_AMBULATORY_CARE_PROVIDER_SITE_OTHER): Payer: Medicare Other

## 2015-04-08 DIAGNOSIS — N400 Enlarged prostate without lower urinary tract symptoms: Secondary | ICD-10-CM | POA: Diagnosis not present

## 2015-04-08 DIAGNOSIS — C61 Malignant neoplasm of prostate: Secondary | ICD-10-CM

## 2015-04-08 DIAGNOSIS — K219 Gastro-esophageal reflux disease without esophagitis: Secondary | ICD-10-CM

## 2015-04-08 DIAGNOSIS — E785 Hyperlipidemia, unspecified: Secondary | ICD-10-CM | POA: Diagnosis not present

## 2015-04-08 DIAGNOSIS — E559 Vitamin D deficiency, unspecified: Secondary | ICD-10-CM | POA: Diagnosis not present

## 2015-04-08 LAB — POCT CBC
Granulocyte percent: 70.8 %G (ref 37–80)
HEMATOCRIT: 44.9 % (ref 43.5–53.7)
Hemoglobin: 14.4 g/dL (ref 14.1–18.1)
LYMPH, POC: 1.5 (ref 0.6–3.4)
MCH, POC: 29.9 pg (ref 27–31.2)
MCHC: 32.1 g/dL (ref 31.8–35.4)
MCV: 93.3 fL (ref 80–97)
MPV: 7.6 fL (ref 0–99.8)
PLATELET COUNT, POC: 262 10*3/uL (ref 142–424)
POC GRANULOCYTE: 4.4 (ref 2–6.9)
POC LYMPH PERCENT: 24.2 %L (ref 10–50)
RBC: 4.82 M/uL (ref 4.69–6.13)
RDW, POC: 13.8 %
WBC: 6.2 10*3/uL (ref 4.6–10.2)

## 2015-04-09 ENCOUNTER — Encounter: Payer: Self-pay | Admitting: Family Medicine

## 2015-04-09 ENCOUNTER — Ambulatory Visit (INDEPENDENT_AMBULATORY_CARE_PROVIDER_SITE_OTHER): Payer: Medicare Other | Admitting: Family Medicine

## 2015-04-09 VITALS — BP 120/67 | HR 71 | Temp 97.4°F | Ht 71.25 in | Wt 211.0 lb

## 2015-04-09 DIAGNOSIS — C61 Malignant neoplasm of prostate: Secondary | ICD-10-CM | POA: Diagnosis not present

## 2015-04-09 DIAGNOSIS — E785 Hyperlipidemia, unspecified: Secondary | ICD-10-CM

## 2015-04-09 DIAGNOSIS — K219 Gastro-esophageal reflux disease without esophagitis: Secondary | ICD-10-CM | POA: Diagnosis not present

## 2015-04-09 DIAGNOSIS — E559 Vitamin D deficiency, unspecified: Secondary | ICD-10-CM | POA: Diagnosis not present

## 2015-04-09 LAB — HEPATIC FUNCTION PANEL
ALBUMIN: 3.9 g/dL (ref 3.5–4.7)
ALK PHOS: 83 IU/L (ref 39–117)
ALT: 16 IU/L (ref 0–44)
AST: 20 IU/L (ref 0–40)
BILIRUBIN, DIRECT: 0.12 mg/dL (ref 0.00–0.40)
Bilirubin Total: 0.4 mg/dL (ref 0.0–1.2)
Total Protein: 6.3 g/dL (ref 6.0–8.5)

## 2015-04-09 LAB — BMP8+EGFR
BUN/Creatinine Ratio: 12 (ref 10–22)
BUN: 11 mg/dL (ref 8–27)
CO2: 27 mmol/L (ref 18–29)
Calcium: 9.1 mg/dL (ref 8.6–10.2)
Chloride: 100 mmol/L (ref 97–108)
Creatinine, Ser: 0.93 mg/dL (ref 0.76–1.27)
GFR calc Af Amer: 84 mL/min/{1.73_m2} (ref >59)
GFR calc non Af Amer: 73 mL/min/{1.73_m2} (ref >59)
Glucose: 103 mg/dL — ABNORMAL HIGH (ref 65–99)
POTASSIUM: 4.5 mmol/L (ref 3.5–5.2)
Sodium: 141 mmol/L (ref 134–144)

## 2015-04-09 LAB — NMR, LIPOPROFILE
CHOLESTEROL: 157 mg/dL (ref 100–199)
HDL CHOLESTEROL BY NMR: 45 mg/dL (ref >39)
HDL PARTICLE NUMBER: 28.4 umol/L — AB (ref >=30.5)
LDL Particle Number: 1070 nmol/L — ABNORMAL HIGH (ref <1000)
LDL Size: 21 nm (ref >20.5)
LDL-C: 84 mg/dL (ref 0–99)
LP-IR SCORE: 51 — AB (ref <=45)
SMALL LDL PARTICLE NUMBER: 382 nmol/L (ref <=527)
Triglycerides by NMR: 141 mg/dL (ref 0–149)

## 2015-04-09 LAB — VITAMIN D 25 HYDROXY (VIT D DEFICIENCY, FRACTURES): Vit D, 25-Hydroxy: 33.6 ng/mL (ref 30.0–100.0)

## 2015-04-09 NOTE — Progress Notes (Signed)
Subjective:    Patient ID: Alec Davis, male    DOB: 1923-07-22, 81 y.o.   MRN: [id number]  HPI Pt here for follow up and management of chronic medical problems which includes hyperlipidemia and GERD. He is taking medications regularly. The patient is doing well with no specific complaints. He has his rectal exams done by the urology specialist. He has had recent lab work and this will be reviewed with him today. He is up-to-date on his other health maintenance issues. The patient is pleasant and alert with no complaints. Has no chest pain, no shortness of breath and no GI symptoms. I did review his medications and we discussed whether he was taking meloxicam or not and he is uncertain about this. I encouraged him to avoid this as much as possible because of all the side effects that would be associated with that at his age. He seems to understand this.      Patient Active Problem List   Diagnosis Date Noted  . Hyperlipemia 08/03/2013  . Depression 08/03/2013  . Vitamin D deficiency 03/07/2011  . GERD (gastroesophageal reflux disease) 03/07/2011  . PROSTATE CANCER 01/19/2011  . HYPERTENSION 01/19/2011  . RIGHT BUNDLE BRANCH BLOCK 01/19/2011  . INTERNAL HEMORRHOIDS 01/19/2011  . DIVERTICULOSIS, COLON 01/19/2011  . COLONIC POLYPS, HX OF 01/19/2011   Outpatient Encounter Prescriptions as of 04/09/2015  Medication Sig  . aspirin 81 MG EC tablet Take 81 mg by mouth daily after supper.    . Calcium-Magnesium-Vitamin D (CITRACAL CALCIUM+D) 600-40-500 MG-MG-UNIT TB24 Take 2 tablets by mouth 1 dose over 46 hours.    . lovastatin (MEVACOR) 20 MG tablet TAKE ONE TABLET BY MOUTH ONE TIME DAILY  . meloxicam (MOBIC) 7.5 MG tablet Take 1 tablet (7.5 mg total) by mouth daily.  . multivitamin (THERAGRAN) per tablet Take 1 tablet by mouth daily.    . niacin (NIASPAN) 750 MG CR tablet Take 1 tablet (750 mg total) by mouth daily.  Marland Kitchen oxyCODONE (OXY IR/ROXICODONE) 5 MG immediate release tablet Take  1 tablet (5 mg total) by mouth every 6 (six) hours as needed for severe pain.  . tamsulosin (FLOMAX) 0.4 MG CAPS capsule Take 1 capsule (0.4 mg total) by mouth daily.     Review of Systems  Constitutional: Negative.   HENT: Negative.   Eyes: Negative.   Respiratory: Negative.   Cardiovascular: Negative.   Gastrointestinal: Negative.   Endocrine: Negative.   Genitourinary: Negative.   Musculoskeletal: Negative.   Skin: Negative.   Allergic/Immunologic: Negative.   Neurological: Negative.   Hematological: Negative.   Psychiatric/Behavioral: Negative.        Objective:   Physical Exam  Constitutional: He is oriented to person, place, and time. He appears well-developed and well-nourished. No distress.  The patient is pleasant and alert.  HENT:  Head: Normocephalic and atraumatic.  Right Ear: External ear normal.  Left Ear: External ear normal.  Nose: Nose normal.  Mouth/Throat: Oropharynx is clear and moist. No oropharyngeal exudate.  He wears hearing aids bilaterally.  Eyes: Conjunctivae and EOM are normal. Pupils are equal, round, and reactive to light. Right eye exhibits no discharge. Left eye exhibits no discharge. No scleral icterus.  Neck: Normal range of motion. Neck supple. No thyromegaly present.  No carotid bruits or anterior cervical adenopathy  Cardiovascular: Normal rate, regular rhythm and normal heart sounds.  Exam reveals no gallop and no friction rub.   No murmur heard. At 72/m  Pulmonary/Chest: Effort normal and breath sounds  normal. No respiratory distress. He has no wheezes. He has no rales. He exhibits no tenderness.  Abdominal: Soft. Bowel sounds are normal. He exhibits no mass. There is no tenderness. There is no rebound and no guarding.  Genitourinary:  This is followed regularly by the urologist because of his prostate cancer.  Musculoskeletal: Normal range of motion. He exhibits no edema or tenderness.  Lymphadenopathy:    He has no cervical  adenopathy.  Neurological: He is alert and oriented to person, place, and time. He has normal reflexes. No cranial nerve deficit.  Skin: Skin is warm and dry. No rash noted. No erythema. No pallor.  Psychiatric: He has a normal mood and affect. His behavior is normal. Judgment and thought content normal.  Nursing note and vitals reviewed.  BP 120/67 mmHg  Pulse 71  Temp(Src) 97.4 F (36.3 C) (Oral)  Ht 5' 11.25" (1.81 m)  Wt 211 lb (95.709 kg)  BMI 29.21 kg/m2        Assessment & Plan:  1. Hyperlipemia -Cholesterol numbers were slightly increased but the patient will continue with his current treatment and aggressive therapeutic lifestyle changes.  2. Vitamin D deficiency -The vitamin D level was at the lower end of the normal range and he will purchase the 2000 strength when he re-supplies himself.  3. Gastroesophageal reflux disease, esophagitis presence not specified -Try to avoid taking the meloxicam and only take extra strength Tylenol if needed for pain  4. PROSTATE CANCER -Continue follow-up with urology  Patient Instructions                       Medicare Annual Wellness Visit  [facility] and the medical providers at [facility] strive to bring you the best medical care.  In doing so we not only want to address your current medical conditions and concerns but also to detect new conditions early and prevent illness, disease and health-related problems.    Medicare offers a yearly Wellness Visit which allows our clinical staff to assess your need for preventative services including immunizations, lifestyle education, counseling to decrease risk of preventable diseases and screening for fall risk and other medical concerns.    This visit is provided free of charge (no copay) for all Medicare recipients. The clinical pharmacists at [facility] have begun to conduct these Wellness Visits which will also include a thorough review  of all your medications.    As you primary medical provider recommend that you make an appointment for your Annual Wellness Visit if you have not done so already this year.  You may set up this appointment before you leave today or you may call back ([phone]) and schedule an appointment.  Please make sure when you call that you mention that you are scheduling your Annual Wellness Visit with the clinical pharmacist so that the appointment may be made for the proper length of time.     Continue current medications. Continue good therapeutic lifestyle changes which include good diet and exercise. Fall precautions discussed with patient. If an FOBT was given today- please return it to our front desk. If you are over 69 years old - you may need Prevnar 74 or the adult Pneumonia vaccine.  Flu Shots are still available at our office. If you still haven't had one please call to set up a nurse visit to get one.   After your visit with [place] today you will receive a survey in the  mail or online from [employer] regarding your care with [place]. Please take a moment to fill this out. Your feedback is very important to [place] as you can help [place] better understand your patient needs as well as improve your experience and satisfaction. WE CARE ABOUT YOU!!!   Stay as active as possible and drink as many fluids as possible Avoid climbing Avoid meloxicam as this medication has a lot of side effects and a person that is 81 years of age. Extra strength Tylenol is always better.   Arrie Senate MD

## 2015-04-09 NOTE — Patient Instructions (Addendum)
Medicare Annual Wellness Visit  [facility] and the medical providers at [facility] strive to bring you the best medical care.  In doing so we not only want to address your current medical conditions and concerns but also to detect new conditions early and prevent illness, disease and health-related problems.    Medicare offers a yearly Wellness Visit which allows our clinical staff to assess your need for preventative services including immunizations, lifestyle education, counseling to decrease risk of preventable diseases and screening for fall risk and other medical concerns.    This visit is provided free of charge (no copay) for all Medicare recipients. The clinical pharmacists at [facility] have begun to conduct these Wellness Visits which will also include a thorough review of all your medications.    As you primary medical provider recommend that you make an appointment for your Annual Wellness Visit if you have not done so already this year.  You may set up this appointment before you leave today or you may call back ([phone]) and schedule an appointment.  Please make sure when you call that you mention that you are scheduling your Annual Wellness Visit with the clinical pharmacist so that the appointment may be made for the proper length of time.     Continue current medications. Continue good therapeutic lifestyle changes which include good diet and exercise. Fall precautions discussed with patient. If an FOBT was given today- please return it to our front desk. If you are over 25 years old - you may need Prevnar 55 or the adult Pneumonia vaccine.  Flu Shots are still available at our office. If you still haven't had one please call to set up a nurse visit to get one.   After your visit with [place] today you will receive a survey in the mail or online from [employer] regarding your care with [place]. Please take a moment to  fill this out. Your feedback is very important to [place] as you can help [place] better understand your patient needs as well as improve your experience and satisfaction. WE CARE ABOUT YOU!!!   Stay as active as possible and drink as many fluids as possible Avoid climbing Avoid meloxicam as this medication has a lot of side effects and a person that is 81 years of age. Extra strength Tylenol is always better.

## 2015-05-06 ENCOUNTER — Other Ambulatory Visit: Payer: Self-pay | Admitting: Family Medicine

## 2015-05-23 DIAGNOSIS — L723 Sebaceous cyst: Secondary | ICD-10-CM | POA: Diagnosis not present

## 2015-06-03 DIAGNOSIS — Z8546 Personal history of malignant neoplasm of prostate: Secondary | ICD-10-CM | POA: Diagnosis not present

## 2015-06-03 DIAGNOSIS — N320 Bladder-neck obstruction: Secondary | ICD-10-CM | POA: Diagnosis not present

## 2015-07-11 DIAGNOSIS — Z961 Presence of intraocular lens: Secondary | ICD-10-CM | POA: Diagnosis not present

## 2015-08-22 ENCOUNTER — Other Ambulatory Visit (INDEPENDENT_AMBULATORY_CARE_PROVIDER_SITE_OTHER): Payer: Medicare Other

## 2015-08-22 DIAGNOSIS — E559 Vitamin D deficiency, unspecified: Secondary | ICD-10-CM

## 2015-08-22 DIAGNOSIS — E785 Hyperlipidemia, unspecified: Secondary | ICD-10-CM | POA: Diagnosis not present

## 2015-08-22 DIAGNOSIS — C61 Malignant neoplasm of prostate: Secondary | ICD-10-CM

## 2015-08-22 NOTE — Progress Notes (Signed)
Lab only 

## 2015-08-23 ENCOUNTER — Ambulatory Visit (INDEPENDENT_AMBULATORY_CARE_PROVIDER_SITE_OTHER): Payer: Medicare Other | Admitting: Family Medicine

## 2015-08-23 ENCOUNTER — Encounter: Payer: Self-pay | Admitting: Family Medicine

## 2015-08-23 VITALS — BP 118/76 | HR 71 | Temp 96.9°F | Ht 71.25 in | Wt 210.0 lb

## 2015-08-23 DIAGNOSIS — E559 Vitamin D deficiency, unspecified: Secondary | ICD-10-CM

## 2015-08-23 DIAGNOSIS — K219 Gastro-esophageal reflux disease without esophagitis: Secondary | ICD-10-CM | POA: Diagnosis not present

## 2015-08-23 DIAGNOSIS — C61 Malignant neoplasm of prostate: Secondary | ICD-10-CM

## 2015-08-23 DIAGNOSIS — N400 Enlarged prostate without lower urinary tract symptoms: Secondary | ICD-10-CM | POA: Diagnosis not present

## 2015-08-23 DIAGNOSIS — E785 Hyperlipidemia, unspecified: Secondary | ICD-10-CM | POA: Diagnosis not present

## 2015-08-23 LAB — CBC WITH DIFFERENTIAL/PLATELET
BASOS: 1 %
Basophils Absolute: 0 10*3/uL (ref 0.0–0.2)
EOS (ABSOLUTE): 0.1 10*3/uL (ref 0.0–0.4)
EOS: 1 %
HEMATOCRIT: 43.3 % (ref 37.5–51.0)
HEMOGLOBIN: 14.9 g/dL (ref 12.6–17.7)
Immature Grans (Abs): 0 10*3/uL (ref 0.0–0.1)
Immature Granulocytes: 0 %
LYMPHS ABS: 1.5 10*3/uL (ref 0.7–3.1)
Lymphs: 21 %
MCH: 32.5 pg (ref 26.6–33.0)
MCHC: 34.4 g/dL (ref 31.5–35.7)
MCV: 95 fL (ref 79–97)
MONOCYTES: 10 %
MONOS ABS: 0.7 10*3/uL (ref 0.1–0.9)
Neutrophils Absolute: 4.6 10*3/uL (ref 1.4–7.0)
Neutrophils: 67 %
Platelets: 224 10*3/uL (ref 150–379)
RBC: 4.58 x10E6/uL (ref 4.14–5.80)
RDW: 13.2 % (ref 12.3–15.4)
WBC: 6.9 10*3/uL (ref 3.4–10.8)

## 2015-08-23 LAB — NMR, LIPOPROFILE
CHOLESTEROL: 140 mg/dL (ref 100–199)
HDL Cholesterol by NMR: 47 mg/dL (ref >39)
HDL PARTICLE NUMBER: 29.2 umol/L — AB (ref >=30.5)
LDL Particle Number: 939 nmol/L (ref <1000)
LDL SIZE: 21.1 nm (ref >20.5)
LDL-C: 76 mg/dL (ref 0–99)
LP-IR Score: 31 (ref <=45)
SMALL LDL PARTICLE NUMBER: 431 nmol/L (ref <=527)
TRIGLYCERIDES BY NMR: 87 mg/dL (ref 0–149)

## 2015-08-23 LAB — HEPATIC FUNCTION PANEL
ALBUMIN: 4 g/dL (ref 3.2–4.6)
ALT: 12 IU/L (ref 0–44)
AST: 18 IU/L (ref 0–40)
Alkaline Phosphatase: 74 IU/L (ref 39–117)
BILIRUBIN TOTAL: 0.5 mg/dL (ref 0.0–1.2)
BILIRUBIN, DIRECT: 0.14 mg/dL (ref 0.00–0.40)
TOTAL PROTEIN: 6.5 g/dL (ref 6.0–8.5)

## 2015-08-23 LAB — BMP8+EGFR
BUN/Creatinine Ratio: 13 (ref 10–22)
BUN: 14 mg/dL (ref 10–36)
CALCIUM: 9.2 mg/dL (ref 8.6–10.2)
CHLORIDE: 101 mmol/L (ref 97–108)
CO2: 25 mmol/L (ref 18–29)
Creatinine, Ser: 1.04 mg/dL (ref 0.76–1.27)
GFR calc Af Amer: 73 mL/min/{1.73_m2} (ref >59)
GFR calc non Af Amer: 63 mL/min/{1.73_m2} (ref >59)
Glucose: 123 mg/dL — ABNORMAL HIGH (ref 65–99)
POTASSIUM: 4 mmol/L (ref 3.5–5.2)
Sodium: 142 mmol/L (ref 134–144)

## 2015-08-23 LAB — VITAMIN D 25 HYDROXY (VIT D DEFICIENCY, FRACTURES): Vit D, 25-Hydroxy: 42.9 ng/mL (ref 30.0–100.0)

## 2015-08-23 NOTE — Patient Instructions (Addendum)
Medicare Annual Wellness Visit  [facility] and the medical providers at [facility] strive to bring you the best medical care.  In doing so we not only want to address your current medical conditions and concerns but also to detect new conditions early and prevent illness, disease and health-related problems.    Medicare offers a yearly Wellness Visit which allows our clinical staff to assess your need for preventative services including immunizations, lifestyle education, counseling to decrease risk of preventable diseases and screening for fall risk and other medical concerns.    This visit is provided free of charge (no copay) for all Medicare recipients. The clinical pharmacists at [facility] have begun to conduct these Wellness Visits which will also include a thorough review of all your medications.    As you primary medical provider recommend that you make an appointment for your Annual Wellness Visit if you have not done so already this year.  You may set up this appointment before you leave today or you may call back [phone]) and schedule an appointment.  Please make sure when you call that you mention that you are scheduling your Annual Wellness Visit with the clinical pharmacist so that the appointment may be made for the proper length of time.     Continue current medications. Continue good therapeutic lifestyle changes which include good diet and exercise. Fall precautions discussed with patient. If an FOBT was given today- please return it to our front desk. If you are over 79 years old - you may need Prevnar 50 or the adult Pneumonia vaccine.  **Flu shots will be available soon--- please call and schedule a FLU-CLINIC appointment**  After your visit with [place] today you will receive a survey in the mail or online from [employer] regarding your care with [place]. Please take a moment to fill this out. Your feedback is  very important to [place] as you can help [place] better understand your patient needs as well as improve your experience and satisfaction. WE CARE ABOUT YOU!!!   **Please join [place] SEPT.22, 2016 from 5:00 to 7:00pm for our OPEN HOUSE! Come out and meet our NEW providers**  Pick up some ZANTAC 1 50mg  - take 1 twice a day. The equate/ [employer] brand is #65 pills for $4.00 As the weather cools down, try to become more active and get more walking in Get out as much as possible Drink plenty of water and fluids

## 2015-08-23 NOTE — Progress Notes (Signed)
Subjective:    Patient ID: Alec Davis, male    DOB: 1923-12-06, 81 y.o.   MRN: [id number]  HPI Pt here for follow up and management of chronic medical problems which includes hyperlipidemia. He is taking medications regularly. The patient is doing well overall. He has had some recent heartburn and indigestion that was worse at nighttime when sleeping. He thinks it is related to his diet. His wife was in the room with him and confirmed this. He does drink occasional beer. He otherwise denies chest pain shortness of breath trouble swallowing nausea vomiting diarrhea or blood in the stool. His passing his water without problems. Is not been able to exercise a lot this summer due to the heat. Admits to more fatigue. The patient has an ongoing prostate cancer and is being followed regularly by his urologist. All of the patient's lab work was reviewed with him before he left the office today.       Patient Active Problem List   Diagnosis Date Noted  . Hyperlipemia 08/03/2013  . Depression 08/03/2013  . Vitamin D deficiency 03/07/2011  . GERD (gastroesophageal reflux disease) 03/07/2011  . PROSTATE CANCER 01/19/2011  . HYPERTENSION 01/19/2011  . RIGHT BUNDLE BRANCH BLOCK 01/19/2011  . INTERNAL HEMORRHOIDS 01/19/2011  . DIVERTICULOSIS, COLON 01/19/2011  . COLONIC POLYPS, HX OF 01/19/2011   Outpatient Encounter Prescriptions as of 08/23/2015  Medication Sig  . aspirin 81 MG EC tablet Take 81 mg by mouth daily after supper.    . Calcium-Magnesium-Vitamin D (CITRACAL CALCIUM+D) 600-40-500 MG-MG-UNIT TB24 Take 2 tablets by mouth 1 dose over 46 hours.    . lovastatin (MEVACOR) 20 MG tablet TAKE ONE TABLET BY MOUTH ONE TIME DAILY  . meloxicam (MOBIC) 7.5 MG tablet Take 1 tablet (7.5 mg total) by mouth daily.  . multivitamin (THERAGRAN) per tablet Take 1 tablet by mouth daily.    . niacin (NIASPAN) 750 MG CR tablet Take 1 tablet (750 mg total) by mouth daily.  Marland Kitchen oxyCODONE (OXY IR/ROXICODONE)  5 MG immediate release tablet Take 1 tablet (5 mg total) by mouth every 6 (six) hours as needed for severe pain.  . tamsulosin (FLOMAX) 0.4 MG CAPS capsule Take 1 capsule (0.4 mg total) by mouth daily.   No facility-administered encounter medications on file as of 08/23/2015.     Review of Systems  Constitutional: Negative.   HENT: Negative.   Eyes: Negative.   Respiratory: Negative.   Cardiovascular: Negative.   Gastrointestinal: Negative.        Some recent heartburn - worse at night  Endocrine: Negative.   Genitourinary: Negative.   Musculoskeletal: Negative.   Skin: Negative.   Allergic/Immunologic: Negative.   Neurological: Negative.   Hematological: Negative.   Psychiatric/Behavioral: Negative.        Objective:   Physical Exam  Constitutional: He is oriented to person, place, and time. He appears well-developed and well-nourished. No distress.  Pleasant and alert and somewhat short of hearing. He wears bilateral hearing aids.  HENT:  Head: Normocephalic and atraumatic.  Left Ear: External ear normal.  Nose: Nose normal.  Mouth/Throat: Oropharynx is clear and moist. No oropharyngeal exudate.  Increased ear cerumen on the right  Eyes: Conjunctivae and EOM are normal. Pupils are equal, round, and reactive to light. Right eye exhibits no discharge. Left eye exhibits no discharge. No scleral icterus.  Neck: Normal range of motion. Neck supple. No thyromegaly present.  Without thyromegaly or bruits  Cardiovascular: Normal rate, regular rhythm and  normal heart sounds.   No murmur heard. The heart is regular at 60/m  Pulmonary/Chest: Effort normal and breath sounds normal. No respiratory distress. He has no wheezes. He has no rales. He exhibits no tenderness.  The chest is clear anteriorly and posteriorly  Abdominal: Soft. Bowel sounds are normal. He exhibits no mass. There is no tenderness. There is no rebound and no guarding.  There was no abdominal tenderness elicited on  palpation today. There is no liver or spleen enlargement and there was no organomegaly.  Musculoskeletal: Normal range of motion. He exhibits no edema or tenderness.  There is some back discomfort secondary to degenerative changes and history of surgery.  Lymphadenopathy:    He has no cervical adenopathy.  Neurological: He is alert and oriented to person, place, and time. He has normal reflexes. No cranial nerve deficit.  Skin: Skin is warm and dry. No rash noted.  Psychiatric: He has a normal mood and affect. His behavior is normal. Judgment and thought content normal.  Nursing note and vitals reviewed.  BP 118/76 mmHg  Pulse 71  Temp(Src) 96.9 F (36.1 C) (Oral)  Ht 5' 11.25" (1.81 m)  Wt 210 lb (95.255 kg)  BMI 29.08 kg/m2        Assessment & Plan:  1. Hyperlipemia -Cholesterol numbers have been returned and he should continue with current treatment of lovastatin and continue with his diet.  2. Vitamin D deficiency -The vitamin D level was good and he should continue with his vitamin D replacement and calcium.  3. Gastroesophageal reflux disease, esophagitis presence not specified -He is going to take some Zantac more regularly for a month. Twice daily. He should limit the intake of meloxicam because of the increased episodes of heartburn. In discussing this with him he says he's not taking that much meloxicam.  4. PROSTATE CANCER -The patient continues to follow-up with his urologist.  5. BPH (benign prostatic hypertrophy) -Continue follow-up with urology  6. Gastroesophageal reflux disease without esophagitis -Take Zantac as directed  Patient Instructions                       Medicare Annual Wellness Visit  [facility] and the medical providers at [facility] strive to bring you the best medical care.  In doing so we not only want to address your current medical conditions and concerns but also to detect new conditions early and prevent  illness, disease and health-related problems.    Medicare offers a yearly Wellness Visit which allows our clinical staff to assess your need for preventative services including immunizations, lifestyle education, counseling to decrease risk of preventable diseases and screening for fall risk and other medical concerns.    This visit is provided free of charge (no copay) for all Medicare recipients. The clinical pharmacists at [facility] have begun to conduct these Wellness Visits which will also include a thorough review of all your medications.    As you primary medical provider recommend that you make an appointment for your Annual Wellness Visit if you have not done so already this year.  You may set up this appointment before you leave today or you may call back ([phone]) and schedule an appointment.  Please make sure when you call that you mention that you are scheduling your Annual Wellness Visit with the clinical pharmacist so that the appointment may be made for the proper length of time.     Continue current  medications. Continue good therapeutic lifestyle changes which include good diet and exercise. Fall precautions discussed with patient. If an FOBT was given today- please return it to our front desk. If you are over 46 years old - you may need Prevnar 77 or the adult Pneumonia vaccine.  **Flu shots will be available soon--- please call and schedule a FLU-CLINIC appointment**  After your visit with [place] today you will receive a survey in the mail or online from [employer] regarding your care with [place]. Please take a moment to fill this out. Your feedback is very important to [place] as you can help [place] better understand your patient needs as well as improve your experience and satisfaction. WE CARE ABOUT YOU!!!   **Please join [place] SEPT.[email or web address], 2016 from 5:00 to 7:00pm for our OPEN HOUSE! Come out and meet our NEW providers**  Pick up some ZANTAC 1 50mg  - take 1 twice a  day. The equate/ [facility] brand is #65 pills for $4.00 As the weather cools down, try to become more active and get more walking in Get out as much as possible Drink plenty of water and fluids    Arrie Senate MD

## 2015-08-29 ENCOUNTER — Encounter: Payer: Self-pay | Admitting: *Deleted

## 2015-09-11 ENCOUNTER — Ambulatory Visit (INDEPENDENT_AMBULATORY_CARE_PROVIDER_SITE_OTHER): Payer: Medicare Other | Admitting: Pharmacist

## 2015-09-11 ENCOUNTER — Encounter: Payer: Self-pay | Admitting: Pharmacist

## 2015-09-11 VITALS — BP 126/72 | HR 78 | Ht 71.0 in | Wt 212.0 lb

## 2015-09-11 DIAGNOSIS — Z0000 Encounter for general adult medical examination without abnormal findings: Secondary | ICD-10-CM

## 2015-09-11 DIAGNOSIS — Z1212 Encounter for screening for malignant neoplasm of rectum: Secondary | ICD-10-CM

## 2015-09-11 DIAGNOSIS — R7309 Other abnormal glucose: Secondary | ICD-10-CM

## 2015-09-11 DIAGNOSIS — R739 Hyperglycemia, unspecified: Secondary | ICD-10-CM

## 2015-09-11 LAB — POCT GLYCOSYLATED HEMOGLOBIN (HGB A1C): HEMOGLOBIN A1C: 4.8

## 2015-09-11 NOTE — Addendum Note (Signed)
Addended by: Selmer Dominion on: 09/11/2015 02:28 PM   Modules accepted: Orders

## 2015-09-11 NOTE — Progress Notes (Signed)
Patient ID: Alec Davis, male   DOB: May 22, 1923, 81 y.o.   MRN: [id number]    Subjective:   Alec Davis is a 81 y.o. male who presents for an Initial Medicare Annual Wellness Visit. He is retired for 30 years after working 61 years for [employer] in the [employer]  Alec Davis also served in the [employer]. He is married and live in [place], [place] with his wife of almost 40 years.    Current Medications (verified) Outpatient Encounter Prescriptions as of 09/11/2015  Medication Sig  . aspirin 81 MG EC tablet Take 81 mg by mouth daily after supper.    . Calcium-Magnesium-Vitamin D (CITRACAL CALCIUM+D) 600-40-500 MG-MG-UNIT TB24 Take 2 tablets by mouth 1 dose over 46 hours.    . lovastatin (MEVACOR) 20 MG tablet TAKE ONE TABLET BY MOUTH ONE TIME DAILY  . niacin (NIASPAN) 750 MG CR tablet Take 1 tablet (750 mg total) by mouth daily.  . tamsulosin (FLOMAX) 0.4 MG CAPS capsule Take 1 capsule (0.4 mg total) by mouth daily.  . Vitamin D, Cholecalciferol, 1000 UNITS TABS Take 1 tablet by mouth daily.  . multivitamin (THERAGRAN) per tablet Take 1 tablet by mouth daily.    . [DISCONTINUED] meloxicam (MOBIC) 7.5 MG tablet Take 1 tablet (7.5 mg total) by mouth daily. (Patient not taking: Reported on 09/11/2015)  .  oxyCODONE (OXY IR/ROXICODONE) 5 MG immediate release tablet Take 1 tablet (5 mg total) by mouth every 6 (six) hours as needed for severe pain. (Patient not taking: Reported on 09/11/2015)   No facility-administered encounter medications on file as of 09/11/2015.    Allergies (verified) Review of patient's allergies indicates no known allergies.   History: Past Medical History  Diagnosis Date  . GERD (gastroesophageal reflux disease)   . Hyperlipidemia   . Hypertension   . Vitamin D deficiency   . BPH (benign prostatic hyperplasia)   . Cataract   . PROSTATE CANCER 01/19/2011    Qualifier: Diagnosis of  By: Nils Pyle CMA Deborra Medina), [name]     Past Surgical History  Procedure  Laterality Date  . Spine surgery    . Cataracts  Bilateral     Dr [name]   History reviewed. No pertinent family history. Social History   Occupational History  . Not on file.   Social History Main Topics  . Smoking status: Former Smoker    Quit date: 01/25/1950  . Smokeless tobacco: Never Used  . Alcohol Use: No  . Drug Use: No  . Sexual Activity: No    Do you feel safe at home?  Yes  Dietary issues and exercise activities discussed: Current Exercise Habits:: The patient does not participate in regular exercise at present  Current Dietary habits:  No following any special dietary recommendations at this time    Cardiac Risk Factors include: advanced age (>2men, >29 women);dyslipidemia;male gender;sedentary lifestyle  Objective:    Today's Vitals   09/11/15 1211  BP: 126/72  Pulse: 78  Height: 5\' 11"  (1.803 m)  Weight: 212 lb (96.163 kg)   Body mass index is 29.58 kg/(m^2).   FBG was 123 (08/22/2015)  A1c checked today ans was 4.8%   Activities of Daily Living In your present state of health, do you have any difficulty performing the following activities: 09/11/2015  Hearing? Y  Vision? N  Difficulty concentrating or making decisions? N  Walking or climbing stairs? N  Dressing or bathing? N  Doing errands, shopping? N  Preparing Food and eating ?  N  Using the Toilet? N  In the past six months, have you accidently leaked urine? N  Do you have problems with loss of bowel control? N  Managing your Medications? N  Managing your Finances? N  Housekeeping or managing your Housekeeping? N    Are there smokers in your home (other than you)? No    Depression Screen PHQ 2/9 Scores 09/11/2015 08/23/2015 04/09/2015 11/27/2014  PHQ - 2 Score 1 1 0 0    Fall Risk Fall Risk  09/11/2015 08/23/2015 04/09/2015 11/27/2014 05/02/2014  Falls in the past year? No No No No No    Cognitive Function: MMSE - Mini Mental State Exam 09/11/2015  Orientation to time 4  Orientation to  Place 5  Registration 3  Attention/ Calculation 5  Recall 2  Language- name 2 objects 2  Language- repeat 1  Language- follow 3 step command 2  Language- read & follow direction 1  Write a sentence 1  Copy design 1  Total score 27    Immunizations and Health Maintenance Immunization History  Administered Date(s) Administered  . Influenza Whole 08/21/2010  . Pneumococcal Conjugate-13 02/19/1999, 01/25/2014  . Td 12/22/2003  . Zoster 12/21/2005   There are no preventive care reminders to display for this patient.  Patient Care Team: Chipper Herb, MD as PCP - General (Family Medicine) Myrlene Broker, MD (Urology) Particia Nearing, OD as Consulting Physician (Optometry) Raeanne Gathers, AUD as Audiologist (Audiology)  Indicate any recent Medical Services you may have received from other than [facility] providers in the past year (date may be approximate).    Assessment:    Annual Wellness Visit  Elevated FBG - BG was 123 with last labs 08/22/2015   Screening Tests Health Maintenance  Topic Date Due  . PNA vac Low Risk Adult (2 of 2 - PPSV23) 10/09/2015 (Originally 01/25/2015)  . INFLUENZA VACCINE  10/23/2015 (Originally 07/22/2015)  . COLONOSCOPY  09/24/2015  . TETANUS/TDAP  09/20/2021  . ZOSTAVAX  Completed        Plan:   During the course of the visit Alec Davis was educated and counseled about the following appropriate screening and preventive services:   Vaccines to include Pneumoccal, Influenza, Hepatitis B, Td, Zostavax - UTD on all vaccines.  Patient usually gets influenza at health department in [place] and plans to get this year in about 2 weeks.  Colorectal cancer screening - FOBT 05/2014 was negative.  Colonoscopy - not required unless having symptoms to indicate GI problems  Cardiovascular disease screening - BP is at goal today.  Lipid panel UTD - patient is taking both lovastatin and niacin.  Diabetes screening - Checked A1c today.  Was WNL.    Glaucoma screening  /  Eye Exam - uTd  Nutrition counseling - discussed limiting high CHO foods and high calorie foods.   Recommended increased physical acitivity - goal of 150 minutes weekly in 4 to 5 sessions.  Also gave information in chair exercises to do at home that can help strengthen core and leg muscles.   Prostate cancer screening - UTD - patient sees Dr Rosana Hoes yearly  High risk medications were noted on patients med list but he was not taking so discontinued (oxy IR and meloxicam)  Patient Instructions (the written plan) were given to the patient.   Cherre Robins, [facility]   09/11/2015

## 2015-09-11 NOTE — Patient Instructions (Signed)
Alec Davis , Thank you for taking time to come for your Medicare Wellness Visit. I appreciate your ongoing commitment to your health goals. Please review the following plan we discussed and let me know if I can assist you in the future.   These are the goals we discussed: Recommend increase physical activity - goal is to get 150 minutes each week (in 4 to 5 sessions per week) Try chair exercises discussed.    This is a list of the screening recommended for you and due dates:  Health Maintenance  Topic Date Due  . Pneumonia vaccines (2 of 2 - PPSV23) completed  . Flu Shot  10/23/2015*  . Colon Cancer Screening  Only recommended again if you are having gastrointestinal problems  . Tetanus Vaccine  09/20/2021  . Shingles Vaccine  Completed  *Topic was postponed. The date shown is not the original due date.   Health Maintenance A healthy lifestyle and preventative care can promote health and wellness.  Maintain regular health, dental, and eye exams.  Eat a healthy diet. Foods like vegetables, fruits, whole grains, low-fat dairy products, and lean protein foods contain the nutrients you need and are low in calories. Decrease your intake of foods high in solid fats, added sugars, and salt. Get information about a proper diet from your health care provider, if necessary.  Regular physical exercise is one of the most important things you can do for your health. Most adults should get at least 150 minutes of moderate-intensity exercise (any activity that increases your heart rate and causes you to sweat) each week. In addition, most adults need muscle-strengthening exercises on 2 or more days a week.   Maintain a healthy weight. The body mass index (BMI) is a screening tool to identify possible weight problems. It provides an estimate of body fat based on height and weight. Your health care provider can find your BMI and can help you achieve or maintain a healthy weight. For males 20 years and  older:  A BMI below 18.5 is considered underweight.  A BMI of 18.5 to 24.9 is normal.  A BMI of 25 to 29.9 is considered overweight.  A BMI of 30 and above is considered obese.  Maintain normal blood lipids and cholesterol by exercising and minimizing your intake of saturated fat. Eat a balanced diet with plenty of fruits and vegetables. Blood tests for lipids and cholesterol should begin at age 25 and be repeated every 5 years. If your lipid or cholesterol levels are high, you are over age 51, or you are at high risk for heart disease, you may need your cholesterol levels checked more frequently.Ongoing high lipid and cholesterol levels should be treated with medicines if diet and exercise are not working.  If you smoke, find out from your health care provider how to quit. If you do not use tobacco, do not start.  Lung cancer screening is recommended for adults aged 6-80 years who are at high risk for developing lung cancer because of a history of smoking. A yearly low-dose CT scan of the lungs is recommended for people who have at least a 30-pack-year history of smoking and are current smokers or have quit within the past 15 years. A pack year of smoking is smoking an average of 1 pack of cigarettes a day for 1 year (for example, a 30-pack-year history of smoking could mean smoking 1 pack a day for 30 years or 2 packs a day for 15 years). Yearly screening  should continue until the smoker has stopped smoking for at least 15 years. Yearly screening should be stopped for people who develop a health problem that would prevent them from having lung cancer treatment.  If you choose to drink alcohol, do not have more than 2 drinks per day. One drink is considered to be 12 oz (360 mL) of beer, 5 oz (150 mL) of wine, or 1.5 oz (45 mL) of liquor.  Avoid the use of street drugs. Do not share needles with anyone. Ask for help if you need support or instructions about stopping the use of drugs.  High  blood pressure causes heart disease and increases the risk of stroke. Blood pressure should be checked at least every 1-2 years. Ongoing high blood pressure should be treated with medicines if weight loss and exercise are not effective.  If you are 32-14 years old, ask your health care provider if you should take aspirin to prevent heart disease.  Diabetes screening involves taking a blood sample to check your fasting blood sugar level. This should be done once every 3 years after age 62 if you are at a normal weight and without risk factors for diabetes. Testing should be considered at a younger age or be carried out more frequently if you are overweight and have at least 1 risk factor for diabetes.  Colorectal cancer can be detected and often prevented. Most routine colorectal cancer screening begins at the age of 82 and continues through age 4. However, your health care provider may recommend screening at an earlier age if you have risk factors for colon cancer. On a yearly basis, your health care provider may provide home test kits to check for hidden blood in the stool. A small camera at the end of a tube may be used to directly examine the colon (sigmoidoscopy or colonoscopy) to detect the earliest forms of colorectal cancer. Talk to your health care provider about this at age 75 when routine screening begins. A direct exam of the colon should be repeated every 5-10 years through age 28, unless early forms of precancerous polyps or small growths are found.  People who are at an increased risk for hepatitis B should be screened for this virus. You are considered at high risk for hepatitis B if:  You were born in a country where hepatitis B occurs often. Talk with your health care provider about which countries are considered high risk.  Your parents were born in a high-risk country and you have not received a shot to protect against hepatitis B (hepatitis B vaccine).  You have HIV or AIDS.  You  use needles to inject street drugs.  You live with, or have sex with, someone who has hepatitis B.  You are a man who has sex with other men (MSM).  You get hemodialysis treatment.  You take certain medicines for conditions like cancer, organ transplantation, and autoimmune conditions.  Hepatitis C blood testing is recommended for all people born from [birth year] through 1963 and any individual with known risk factors for hepatitis C.  Healthy men should no longer receive prostate-specific antigen (PSA) blood tests as part of routine cancer screening. Talk to your health care provider about prostate cancer screening.  Testicular cancer screening is not recommended for adolescents or adult males who have no symptoms. Screening includes self-exam, a health care provider exam, and other screening tests. Consult with your health care provider about any symptoms you have or any concerns you have about testicular  cancer.  Practice safe sex. Use condoms and avoid high-risk sexual practices to reduce the spread of sexually transmitted infections (STIs).  You should be screened for STIs, including gonorrhea and chlamydia if:  You are sexually active and are younger than 24 years.  You are older than 24 years, and your health care provider tells you that you are at risk for this type of infection.  Your sexual activity has changed since you were last screened, and you are at an increased risk for chlamydia or gonorrhea. Ask your health care provider if you are at risk.  If you are at risk of being infected with HIV, it is recommended that you take a prescription medicine daily to prevent HIV infection. This is called pre-exposure prophylaxis (PrEP). You are considered at risk if:  You are a man who has sex with other men (MSM).  You are a heterosexual man who is sexually active with multiple partners.  You take drugs by injection.  You are sexually active with a partner who has HIV.  Talk with  your health care provider about whether you are at high risk of being infected with HIV. If you choose to begin PrEP, you should first be tested for HIV. You should then be tested every 3 months for as long as you are taking PrEP.  Use sunscreen. Apply sunscreen liberally and repeatedly throughout the day. You should seek shade when your shadow is shorter than you. Protect yourself by wearing long sleeves, pants, a wide-brimmed hat, and sunglasses year round whenever you are outdoors.  Tell your health care provider of new moles or changes in moles, especially if there is a change in shape or color. Also, tell your health care provider if a mole is larger than the size of a pencil eraser.  A one-time screening for abdominal aortic aneurysm (AAA) and surgical repair of large AAAs by ultrasound is recommended for men aged 66-75 years who are current or former smokers.  Stay current with your vaccines (immunizations). Document Released: 06/04/2008 Document Revised: 12/12/2013 Document Reviewed: 05/04/2011 [facility] Patient Information 2015 [place], [place]. This information is not intended to replace advice given to you by your health care provider. Make sure you discuss any questions you have with your health care provider.

## 2015-09-13 LAB — FECAL OCCULT BLOOD, IMMUNOCHEMICAL: Fecal Occult Bld: NEGATIVE

## 2015-10-04 DIAGNOSIS — Z23 Encounter for immunization: Secondary | ICD-10-CM | POA: Diagnosis not present

## 2015-11-07 ENCOUNTER — Other Ambulatory Visit: Payer: Self-pay | Admitting: Family Medicine

## 2015-11-28 DIAGNOSIS — L708 Other acne: Secondary | ICD-10-CM | POA: Diagnosis not present

## 2015-12-09 ENCOUNTER — Other Ambulatory Visit: Payer: Self-pay | Admitting: Family Medicine

## 2016-01-15 ENCOUNTER — Ambulatory Visit: Payer: Medicare Other | Admitting: Family Medicine

## 2016-01-17 ENCOUNTER — Ambulatory Visit: Payer: Medicare Other | Admitting: Family Medicine

## 2016-01-17 ENCOUNTER — Encounter: Payer: Self-pay | Admitting: Family Medicine

## 2016-01-17 ENCOUNTER — Ambulatory Visit (INDEPENDENT_AMBULATORY_CARE_PROVIDER_SITE_OTHER): Payer: Medicare Other | Admitting: Family Medicine

## 2016-01-17 VITALS — BP 109/65 | HR 67 | Temp 97.0°F | Ht 71.0 in | Wt 212.0 lb

## 2016-01-17 DIAGNOSIS — E559 Vitamin D deficiency, unspecified: Secondary | ICD-10-CM | POA: Diagnosis not present

## 2016-01-17 DIAGNOSIS — N400 Enlarged prostate without lower urinary tract symptoms: Secondary | ICD-10-CM | POA: Diagnosis not present

## 2016-01-17 DIAGNOSIS — K219 Gastro-esophageal reflux disease without esophagitis: Secondary | ICD-10-CM | POA: Diagnosis not present

## 2016-01-17 DIAGNOSIS — H6123 Impacted cerumen, bilateral: Secondary | ICD-10-CM | POA: Diagnosis not present

## 2016-01-17 DIAGNOSIS — C61 Malignant neoplasm of prostate: Secondary | ICD-10-CM

## 2016-01-17 DIAGNOSIS — E785 Hyperlipidemia, unspecified: Secondary | ICD-10-CM

## 2016-01-17 NOTE — Patient Instructions (Addendum)
Medicare Annual Wellness Visit  [facility] and the medical providers at [facility] strive to bring you the best medical care.  In doing so we not only want to address your current medical conditions and concerns but also to detect new conditions early and prevent illness, disease and health-related problems.    Medicare offers a yearly Wellness Visit which allows our clinical staff to assess your need for preventative services including immunizations, lifestyle education, counseling to decrease risk of preventable diseases and screening for fall risk and other medical concerns.    This visit is provided free of charge (no copay) for all Medicare recipients. The clinical pharmacists at [facility] have begun to conduct these Wellness Visits which will also include a thorough review of all your medications.    As you primary medical provider recommend that you make an appointment for your Annual Wellness Visit if you have not done so already this year.  You may set up this appointment before you leave today or you may call back [phone] and schedule an appointment.  Please make sure when you call that you mention that you are scheduling your Annual Wellness Visit with the clinical pharmacist so that the appointment may be made for the proper length of time.     Continue current medications. Continue good therapeutic lifestyle changes which include good diet and exercise. Fall precautions discussed with patient. If an FOBT was given today- please return it to our front desk. If you are over 80 years old - you may need Prevnar 61 or the adult Pneumonia vaccine.  **Flu shots are available--- please call and schedule a FLU-CLINIC appointment**  After your visit with [place] today you will receive a survey in the mail or online from [employer] regarding your care with [place]. Please take a moment to fill this out. Your feedback is very  important to [place] as you can help [place] better understand your patient needs as well as improve your experience and satisfaction. WE CARE ABOUT YOU!!!   The patient should continue with his current medications and he should get as much exercise as possible and watch his diet as closely as possible He should follow-up with the urologist as planned This winter and should drink plenty of fluids and stay well hydrated and use a cool mist humidifier in her bedroom at nighttime Debrox eardrops can be used periodically in each ear canal to help keep the ear wax SO that he does not get so hard and dry. He should use 2-3 drops nightly in each ear weight of week and repeat this and this will help keep earwax softer Do not do any climbing

## 2016-01-17 NOTE — Progress Notes (Signed)
Subjective:    Patient ID: Alec Davis, male    DOB: [birth year], 82 y.o.   MRN: [id number]  HPI Pt here for follow up and management of chronic medical problems which includes hyperlipidemia. He is taking medications regularly. The patient is doing well with no specific complaints. He does have urinary frequency. He does have prostate cancer he's been followed regularly by the urologist. He denies any chest pain or shortness of breath. He swallowing without problems and denies heartburn indigestion nausea vomiting diarrhea or blood in the stool. He does have urinary frequency.      Patient Active Problem List   Diagnosis Date Noted  . Hyperlipemia 08/03/2013  . Depression 08/03/2013  . Vitamin D deficiency 03/07/2011  . GERD (gastroesophageal reflux disease) 03/07/2011  . HYPERTENSION 01/19/2011  . RIGHT BUNDLE BRANCH BLOCK 01/19/2011  . INTERNAL HEMORRHOIDS 01/19/2011  . DIVERTICULOSIS, COLON 01/19/2011  . COLONIC POLYPS, HX OF 01/19/2011   Outpatient Encounter Prescriptions as of 01/17/2016  Medication Sig  . aspirin 81 MG EC tablet Take 81 mg by mouth daily after supper.    . Calcium-Magnesium-Vitamin D (CITRACAL CALCIUM+D) 600-40-500 MG-MG-UNIT TB24 Take 2 tablets by mouth 1 dose over 46 hours.    . lovastatin (MEVACOR) 20 MG tablet TAKE ONE TABLET BY MOUTH ONE TIME DAILY  . multivitamin (THERAGRAN) per tablet Take 1 tablet by mouth daily.    . niacin (NIASPAN) 750 MG CR tablet TAKE ONE TABLET BY MOUTH ONE TIME DAILY  . tamsulosin (FLOMAX) 0.4 MG CAPS capsule Take 1 capsule (0.4 mg total) by mouth daily.  . Vitamin D, Cholecalciferol, 1000 UNITS TABS Take 1 tablet by mouth daily.   No facility-administered encounter medications on file as of 01/17/2016.     Review of Systems  Constitutional: Negative.   HENT: Negative.   Eyes: Negative.   Respiratory: Negative.   Cardiovascular: Negative.   Gastrointestinal: Negative.   Endocrine: Negative.   Genitourinary:  Negative.   Musculoskeletal: Negative.   Skin: Negative.   Allergic/Immunologic: Negative.   Neurological: Negative.   Hematological: Negative.   Psychiatric/Behavioral: Negative.        Objective:   Physical Exam  Constitutional: He is oriented to person, place, and time. He appears well-developed and well-nourished. No distress.  Pleasant and alert and much younger looking than his stated age of 10 years.  HENT:  Head: Normocephalic and atraumatic.  Right Ear: External ear normal.  Left Ear: External ear normal.  Nose: Nose normal.  Mouth/Throat: Oropharynx is clear and moist. No oropharyngeal exudate.  Eyes: Conjunctivae and EOM are normal. Pupils are equal, round, and reactive to light. Right eye exhibits no discharge. Left eye exhibits no discharge. No scleral icterus.  Neck: Normal range of motion. Neck supple. No thyromegaly present.  No bruits thyromegaly or anterior cervical adenopathy  Cardiovascular: Normal rate, regular rhythm and normal heart sounds.   No murmur heard. Rhythm is regular at 72/m  Pulmonary/Chest: Effort normal and breath sounds normal. No respiratory distress. He has no wheezes. He has no rales. He exhibits no tenderness.  Abdominal: Soft. Bowel sounds are normal. He exhibits no mass. There is no tenderness. There is no rebound and no guarding.  Genitourinary:  This exam is done regularly by the urologist because of his prostate cancer.  Musculoskeletal: Normal range of motion. He exhibits no edema or tenderness.  Lymphadenopathy:    He has no cervical adenopathy.  Neurological: He is alert and oriented to person, place, and time.  He has normal reflexes. No cranial nerve deficit.  Skin: Skin is warm and dry. No rash noted.  Psychiatric: He has a normal mood and affect. His behavior is normal. Judgment and thought content normal.  Nursing note and vitals reviewed.  BP 109/65 mmHg  Pulse 67  Temp(Src) 97 F (36.1 C) (Oral)  Ht 5' 11"  (1.803 m)  Wt  212 lb (96.163 kg)  BMI 29.58 kg/m2        Assessment & Plan:  1. Hyperlipemia -Continue current treatment pending results of lab work - BMP8+EGFR; Future - CBC with Differential/Platelet; Future - NMR, lipoprofile; Future  2. Vitamin D deficiency -Continue current treatment pending results of lab work - CBC with Differential/Platelet; Future - VITAMIN D 25 Hydroxy (Vit-D Deficiency, Fractures); Future  3. Gastroesophageal reflux disease, esophagitis presence not specified -The patient has no complaints with this today. - BMP8+EGFR; Future - CBC with Differential/Platelet; Future - Hepatic function panel; Future  4. BPH (benign prostatic hypertrophy) -Continue Flomax and follow-up with urologist - CBC with Differential/Platelet; Future  5. PROSTATE CANCER -Continue Flomax and follow-up with urology - CBC with Differential/Platelet; Future  6. Impacted cerumen of both ears -Ear irrigation done today on both ears  Patient Instructions                       Medicare Annual Wellness Visit  [facility] and the medical providers at [facility] strive to bring you the best medical care.  In doing so we not only want to address your current medical conditions and concerns but also to detect new conditions early and prevent illness, disease and health-related problems.    Medicare offers a yearly Wellness Visit which allows our clinical staff to assess your need for preventative services including immunizations, lifestyle education, counseling to decrease risk of preventable diseases and screening for fall risk and other medical concerns.    This visit is provided free of charge (no copay) for all Medicare recipients. The clinical pharmacists at [facility] have begun to conduct these Wellness Visits which will also include a thorough review of all your medications.    As you primary medical provider recommend that you make an  appointment for your Annual Wellness Visit if you have not done so already this year.  You may set up this appointment before you leave today or you may call back [phone]) and schedule an appointment.  Please make sure when you call that you mention that you are scheduling your Annual Wellness Visit with the clinical pharmacist so that the appointment may be made for the proper length of time.     Continue current medications. Continue good therapeutic lifestyle changes which include good diet and exercise. Fall precautions discussed with patient. If an FOBT was given today- please return it to our front desk. If you are over 49 years old - you may need Prevnar 58 or the adult Pneumonia vaccine.  **Flu shots are available--- please call and schedule a FLU-CLINIC appointment**  After your visit with [place] today you will receive a survey in the mail or online from [employer] regarding your care with [place]. Please take a moment to fill this out. Your feedback is very important to [place] as you can help [place] better understand your patient needs as well as improve your experience and satisfaction. WE CARE ABOUT YOU!!!   The patient should continue with his current medications and he should get as much  exercise as possible and watch his diet as closely as possible He should follow-up with the urologist as planned This winter and should drink plenty of fluids and stay well hydrated and use a cool mist humidifier in her bedroom at nighttime Debrox eardrops can be used periodically in each ear canal to help keep the ear wax SO that he does not get so hard and dry. He should use 2-3 drops nightly in each ear weight of week and repeat this and this will help keep earwax softer Do not do any climbing   Arrie Senate MD

## 2016-01-22 ENCOUNTER — Other Ambulatory Visit (INDEPENDENT_AMBULATORY_CARE_PROVIDER_SITE_OTHER): Payer: Medicare Other

## 2016-01-22 DIAGNOSIS — K219 Gastro-esophageal reflux disease without esophagitis: Secondary | ICD-10-CM

## 2016-01-22 DIAGNOSIS — C61 Malignant neoplasm of prostate: Secondary | ICD-10-CM | POA: Diagnosis not present

## 2016-01-22 DIAGNOSIS — E785 Hyperlipidemia, unspecified: Secondary | ICD-10-CM | POA: Diagnosis not present

## 2016-01-22 DIAGNOSIS — N400 Enlarged prostate without lower urinary tract symptoms: Secondary | ICD-10-CM | POA: Diagnosis not present

## 2016-01-22 DIAGNOSIS — E559 Vitamin D deficiency, unspecified: Secondary | ICD-10-CM | POA: Diagnosis not present

## 2016-01-22 DIAGNOSIS — Z0000 Encounter for general adult medical examination without abnormal findings: Secondary | ICD-10-CM

## 2016-01-22 LAB — POCT UA - MICROSCOPIC ONLY
BACTERIA, U MICROSCOPIC: NEGATIVE
Casts, Ur, LPF, POC: NEGATIVE
Crystals, Ur, HPF, POC: NEGATIVE
MUCUS UA: NEGATIVE
RBC, URINE, MICROSCOPIC: NEGATIVE
WBC, UR, HPF, POC: NEGATIVE
YEAST UA: NEGATIVE

## 2016-01-22 LAB — POCT URINALYSIS DIPSTICK
BILIRUBIN UA: NEGATIVE
Blood, UA: NEGATIVE
GLUCOSE UA: NEGATIVE
KETONES UA: NEGATIVE
LEUKOCYTES UA: NEGATIVE
NITRITE UA: NEGATIVE
Protein, UA: NEGATIVE
SPEC GRAV UA: 1.015
Urobilinogen, UA: NEGATIVE
pH, UA: 7.5

## 2016-01-23 LAB — NMR, LIPOPROFILE
Cholesterol: 140 mg/dL (ref 100–199)
HDL CHOLESTEROL BY NMR: 50 mg/dL (ref >39)
HDL Particle Number: 31.1 umol/L (ref >=30.5)
LDL PARTICLE NUMBER: 846 nmol/L (ref <1000)
LDL Size: 21 nm (ref >20.5)
LDL-C: 68 mg/dL (ref 0–99)
LP-IR SCORE: 39 (ref <=45)
Small LDL Particle Number: 460 nmol/L (ref <=527)
Triglycerides by NMR: 112 mg/dL (ref 0–149)

## 2016-01-23 LAB — CBC WITH DIFFERENTIAL/PLATELET
BASOS ABS: 0 10*3/uL (ref 0.0–0.2)
Basos: 1 %
EOS (ABSOLUTE): 0.2 10*3/uL (ref 0.0–0.4)
Eos: 3 %
HEMATOCRIT: 43.1 % (ref 37.5–51.0)
HEMOGLOBIN: 14.6 g/dL (ref 12.6–17.7)
Immature Grans (Abs): 0 10*3/uL (ref 0.0–0.1)
Immature Granulocytes: 0 %
LYMPHS ABS: 1.5 10*3/uL (ref 0.7–3.1)
Lymphs: 24 %
MCH: 31.7 pg (ref 26.6–33.0)
MCHC: 33.9 g/dL (ref 31.5–35.7)
MCV: 94 fL (ref 79–97)
MONOCYTES: 10 %
MONOS ABS: 0.6 10*3/uL (ref 0.1–0.9)
NEUTROS ABS: 3.8 10*3/uL (ref 1.4–7.0)
Neutrophils: 62 %
Platelets: 247 10*3/uL (ref 150–379)
RBC: 4.6 x10E6/uL (ref 4.14–5.80)
RDW: 14.4 % (ref 12.3–15.4)
WBC: 6.2 10*3/uL (ref 3.4–10.8)

## 2016-01-23 LAB — HEPATIC FUNCTION PANEL
ALK PHOS: 80 IU/L (ref 39–117)
ALT: 15 IU/L (ref 0–44)
AST: 20 IU/L (ref 0–40)
Albumin: 3.9 g/dL (ref 3.2–4.6)
Bilirubin Total: 0.6 mg/dL (ref 0.0–1.2)
Bilirubin, Direct: 0.16 mg/dL (ref 0.00–0.40)
TOTAL PROTEIN: 6.4 g/dL (ref 6.0–8.5)

## 2016-01-23 LAB — BMP8+EGFR
BUN/Creatinine Ratio: 11 (ref 10–22)
BUN: 11 mg/dL (ref 10–36)
CALCIUM: 9.2 mg/dL (ref 8.6–10.2)
CHLORIDE: 103 mmol/L (ref 96–106)
CO2: 26 mmol/L (ref 18–29)
Creatinine, Ser: 1 mg/dL (ref 0.76–1.27)
GFR, EST AFRICAN AMERICAN: 76 mL/min/{1.73_m2} (ref >59)
GFR, EST NON AFRICAN AMERICAN: 66 mL/min/{1.73_m2} (ref >59)
Glucose: 115 mg/dL — ABNORMAL HIGH (ref 65–99)
POTASSIUM: 4.1 mmol/L (ref 3.5–5.2)
Sodium: 142 mmol/L (ref 134–144)

## 2016-01-23 LAB — VITAMIN D 25 HYDROXY (VIT D DEFICIENCY, FRACTURES): VIT D 25 HYDROXY: 38.1 ng/mL (ref 30.0–100.0)

## 2016-02-11 ENCOUNTER — Other Ambulatory Visit: Payer: Self-pay

## 2016-02-11 MED ORDER — LOVASTATIN 20 MG PO TABS: 20.0000 mg | ORAL_TABLET | Freq: Every day | ORAL | Status: DC

## 2016-04-20 DIAGNOSIS — H2513 Age-related nuclear cataract, bilateral: Secondary | ICD-10-CM | POA: Diagnosis not present

## 2016-05-12 ENCOUNTER — Other Ambulatory Visit: Payer: Self-pay | Admitting: Family Medicine

## 2016-05-22 ENCOUNTER — Encounter: Payer: Self-pay | Admitting: Family Medicine

## 2016-05-22 ENCOUNTER — Ambulatory Visit (INDEPENDENT_AMBULATORY_CARE_PROVIDER_SITE_OTHER): Payer: Medicare Other | Admitting: Family Medicine

## 2016-05-22 ENCOUNTER — Ambulatory Visit (INDEPENDENT_AMBULATORY_CARE_PROVIDER_SITE_OTHER): Payer: Medicare Other

## 2016-05-22 VITALS — BP 131/76 | HR 74 | Temp 96.5°F | Ht 71.0 in | Wt 209.0 lb

## 2016-05-22 DIAGNOSIS — R739 Hyperglycemia, unspecified: Secondary | ICD-10-CM

## 2016-05-22 DIAGNOSIS — E559 Vitamin D deficiency, unspecified: Secondary | ICD-10-CM | POA: Diagnosis not present

## 2016-05-22 DIAGNOSIS — N400 Enlarged prostate without lower urinary tract symptoms: Secondary | ICD-10-CM

## 2016-05-22 DIAGNOSIS — E785 Hyperlipidemia, unspecified: Secondary | ICD-10-CM | POA: Diagnosis not present

## 2016-05-22 DIAGNOSIS — I10 Essential (primary) hypertension: Secondary | ICD-10-CM

## 2016-05-22 DIAGNOSIS — K219 Gastro-esophageal reflux disease without esophagitis: Secondary | ICD-10-CM

## 2016-05-22 DIAGNOSIS — C61 Malignant neoplasm of prostate: Secondary | ICD-10-CM

## 2016-05-22 DIAGNOSIS — R7309 Other abnormal glucose: Secondary | ICD-10-CM

## 2016-05-22 NOTE — Progress Notes (Signed)
Subjective:    Patient ID: Alec Davis, male    DOB: 07-31-1923, 82 y.o.   MRN: [id number]  HPI Pt here for follow up and management of chronic medical problems which includes hyperlipidemia and GERD. He is taking medications regularly.The patient comes to the visit today with his wife. He is doing well as usual with no specific complaints. He sees the urologist regularly because of his prostate cancer and this is when he gets his rectal exam. His also due to get a chest x-ray and lab work and he is not fasting and will return to the office fasting for his lab work. The patient denies any chest pain or shortness of breath. He indicates that he knows that he has wife both do not get as much exercise as they should get. He acknowledges the need to get more. He denies any trouble swallowing problems with reflux changes in bowel habits including no blood in the stool or black tarry bowel movements. He passes his water there more frequently and a little bit slower but no further issues with his prostate cancer. He has a visit with the urologist coming up soon. He is pleasant and alert and in good spirits and thankful for doing as well as he has had his age. He will soon be 82 years old.     Patient Active Problem List   Diagnosis Date Noted  . Hyperlipemia 08/03/2013  . Depression 08/03/2013  . Vitamin D deficiency 03/07/2011  . GERD (gastroesophageal reflux disease) 03/07/2011  . HYPERTENSION 01/19/2011  . RIGHT BUNDLE BRANCH BLOCK 01/19/2011  . INTERNAL HEMORRHOIDS 01/19/2011  . DIVERTICULOSIS, COLON 01/19/2011  . COLONIC POLYPS, HX OF 01/19/2011   Outpatient Encounter Prescriptions as of 05/22/2016  Medication Sig  . aspirin 81 MG EC tablet Take 81 mg by mouth daily after supper.    . Calcium-Magnesium-Vitamin D (CITRACAL CALCIUM+D) 600-40-500 MG-MG-UNIT TB24 Take 2 tablets by mouth 1 dose over 46 hours.    . lovastatin (MEVACOR) 20 MG tablet TAKE 1 TABLET DAILY  . multivitamin  (THERAGRAN) per tablet Take 1 tablet by mouth daily.    . niacin (NIASPAN) 750 MG CR tablet TAKE ONE TABLET BY MOUTH ONE TIME DAILY  . tamsulosin (FLOMAX) 0.4 MG CAPS capsule Take 1 capsule (0.4 mg total) by mouth daily.  . Vitamin D, Cholecalciferol, 1000 UNITS TABS Take 1 tablet by mouth daily.   No facility-administered encounter medications on file as of 05/22/2016.     Review of Systems  Constitutional: Negative.   HENT: Negative.   Eyes: Negative.   Respiratory: Negative.   Cardiovascular: Negative.   Gastrointestinal: Negative.   Endocrine: Negative.   Genitourinary: Negative.   Musculoskeletal: Negative.   Skin: Negative.   Allergic/Immunologic: Negative.   Neurological: Negative.   Hematological: Negative.   Psychiatric/Behavioral: Negative.        Objective:   Physical Exam  Constitutional: He is oriented to person, place, and time. He appears well-developed and well-nourished.  Pleasant and alert  HENT:  Head: Normocephalic and atraumatic.  Right Ear: External ear normal.  Left Ear: External ear normal.  Nose: Nose normal.  Mouth/Throat: Oropharynx is clear and moist. No oropharyngeal exudate.  Eyes: Conjunctivae and EOM are normal. Pupils are equal, round, and reactive to light. Right eye exhibits no discharge. Left eye exhibits no discharge. No scleral icterus.  Neck: Normal range of motion. Neck supple. No thyromegaly present.  Cardiovascular: Normal rate, regular rhythm, normal heart sounds and intact distal  pulses.  Exam reveals no gallop and no friction rub.   No murmur heard. The heart has a regular rate and rhythm at 72/m  Pulmonary/Chest: Effort normal and breath sounds normal. No respiratory distress. He has no wheezes. He has no rales. He exhibits no tenderness.  Clear anteriorly and posteriorly no axillary adenopathy  Abdominal: Soft. Bowel sounds are normal. He exhibits no mass. There is no tenderness. There is no rebound and no guarding.  No  abdominal tenderness masses or inguinal adenopathy  Genitourinary:  Regular follow-up with urology with a visit coming up in June or July  Musculoskeletal: Normal range of motion. He exhibits no edema or tenderness.  Some back pain with arising from the table but otherwise no problems with mobility  Lymphadenopathy:    He has no cervical adenopathy.  Neurological: He is alert and oriented to person, place, and time. He has normal reflexes. No cranial nerve deficit.  Skin: Skin is warm and dry. No rash noted.  Psychiatric: He has a normal mood and affect. His behavior is normal. Judgment and thought content normal.  Nursing note and vitals reviewed.  BP 131/76 mmHg  Pulse 74  Temp(Src) 96.5 F (35.8 C) (Oral)  Ht 5' 11"  (1.803 m)  Wt 209 lb (94.802 kg)  BMI 29.16 kg/m2        Assessment & Plan:  1. Hyperlipemia -Continue with aggressive therapeutic lifestyle changes as possible and with lovastatin and Niaspan. - DG Chest 2 View; Future - BMP8+EGFR; Future - CBC with Differential/Platelet; Future - Hepatic function panel; Future - NMR, lipoprofile; Future  2. Vitamin D deficiency -Continue with current treatment pending results of lab work - CBC with Differential/Platelet; Future - VITAMIN D 25 Hydroxy (Vit-D Deficiency, Fractures); Future  3. Gastroesophageal reflux disease, esophagitis presence not specified -The patient has no complaints with reflux today. He will continue to watch and avoid acidic foods and things that would bother his stomach more often. - CBC with Differential/Platelet; Future - Hepatic function panel; Future  4. BPH (benign prostatic hypertrophy) -The patient does have an enlarged prostate and it is prostate cancer. He follows up regularly with the urologist. - CBC with Differential/Platelet; Future  5. PROSTATE CANCER -Follow-up with urology as planned - CBC with Differential/Platelet; Future  6. Elevated blood sugar -Watch diet as closely as  possible avoiding sweets and carbs as much as possible - CBC with Differential/Platelet; Future  7. Essential hypertension -The blood pressure is good today he will continue to watch his sodium intake and keep his weight under as good of control as possible  Patient Instructions                       Medicare Annual Wellness Visit  [facility] and the medical providers at [facility] strive to bring you the best medical care.  In doing so we not only want to address your current medical conditions and concerns but also to detect new conditions early and prevent illness, disease and health-related problems.    Medicare offers a yearly Wellness Visit which allows our clinical staff to assess your need for preventative services including immunizations, lifestyle education, counseling to decrease risk of preventable diseases and screening for fall risk and other medical concerns.    This visit is provided free of charge (no copay) for all Medicare recipients. The clinical pharmacists at [facility] have begun to conduct these Wellness Visits which will also include a  thorough review of all your medications.    As you primary medical provider recommend that you make an appointment for your Annual Wellness Visit if you have not done so already this year.  You may set up this appointment before you leave today or you may call back [phone]) and schedule an appointment.  Please make sure when you call that you mention that you are scheduling your Annual Wellness Visit with the clinical pharmacist so that the appointment may be made for the proper length of time.     Continue current medications. Continue good therapeutic lifestyle changes which include good diet and exercise. Fall precautions discussed with patient. If an FOBT was given today- please return it to our front desk. If you are over 78 years old - you may need Prevnar 9 or the adult Pneumonia  vaccine.  **Flu shots are available--- please call and schedule a FLU-CLINIC appointment**  After your visit with [place] today you will receive a survey in the mail or online from [employer] regarding your care with [place]. Please take a moment to fill this out. Your feedback is very important to [place] as you can help [place] better understand your patient needs as well as improve your experience and satisfaction. WE CARE ABOUT YOU!!!   Keep follow-up appointment with urology because of prostate cancer Try to become more active with walking Continue to drink plenty of fluids We will call with lab work results as soon as they become available   Arrie Senate MD

## 2016-05-22 NOTE — Patient Instructions (Addendum)
Medicare Annual Wellness Visit  [facility] and the medical providers at [facility] strive to bring you the best medical care.  In doing so we not only want to address your current medical conditions and concerns but also to detect new conditions early and prevent illness, disease and health-related problems.    Medicare offers a yearly Wellness Visit which allows our clinical staff to assess your need for preventative services including immunizations, lifestyle education, counseling to decrease risk of preventable diseases and screening for fall risk and other medical concerns.    This visit is provided free of charge (no copay) for all Medicare recipients. The clinical pharmacists at [facility] have begun to conduct these Wellness Visits which will also include a thorough review of all your medications.    As you primary medical provider recommend that you make an appointment for your Annual Wellness Visit if you have not done so already this year.  You may set up this appointment before you leave today or you may call back [phone] and schedule an appointment.  Please make sure when you call that you mention that you are scheduling your Annual Wellness Visit with the clinical pharmacist so that the appointment may be made for the proper length of time.     Continue current medications. Continue good therapeutic lifestyle changes which include good diet and exercise. Fall precautions discussed with patient. If an FOBT was given today- please return it to our front desk. If you are over 21 years old - you may need Prevnar 5 or the adult Pneumonia vaccine.  **Flu shots are available--- please call and schedule a FLU-CLINIC appointment**  After your visit with [place] today you will receive a survey in the mail or online from [employer] regarding your care with [place]. Please take a moment to fill this out. Your feedback is very  important to [place] as you can help [place] better understand your patient needs as well as improve your experience and satisfaction. WE CARE ABOUT YOU!!!   Keep follow-up appointment with urology because of prostate cancer Try to become more active with walking Continue to drink plenty of fluids We will call with lab work results as soon as they become available

## 2016-05-26 ENCOUNTER — Other Ambulatory Visit: Payer: Medicare Other

## 2016-05-26 DIAGNOSIS — K219 Gastro-esophageal reflux disease without esophagitis: Secondary | ICD-10-CM | POA: Diagnosis not present

## 2016-05-26 DIAGNOSIS — E559 Vitamin D deficiency, unspecified: Secondary | ICD-10-CM | POA: Diagnosis not present

## 2016-05-26 DIAGNOSIS — N400 Enlarged prostate without lower urinary tract symptoms: Secondary | ICD-10-CM

## 2016-05-26 DIAGNOSIS — C61 Malignant neoplasm of prostate: Secondary | ICD-10-CM | POA: Diagnosis not present

## 2016-05-26 DIAGNOSIS — E785 Hyperlipidemia, unspecified: Secondary | ICD-10-CM

## 2016-05-26 DIAGNOSIS — R739 Hyperglycemia, unspecified: Secondary | ICD-10-CM

## 2016-05-26 DIAGNOSIS — R7309 Other abnormal glucose: Secondary | ICD-10-CM | POA: Diagnosis not present

## 2016-05-27 LAB — CBC WITH DIFFERENTIAL/PLATELET
BASOS ABS: 0 10*3/uL (ref 0.0–0.2)
BASOS: 1 %
EOS (ABSOLUTE): 0.1 10*3/uL (ref 0.0–0.4)
Eos: 2 %
HEMOGLOBIN: 14.7 g/dL (ref 12.6–17.7)
Hematocrit: 42.1 % (ref 37.5–51.0)
IMMATURE GRANS (ABS): 0 10*3/uL (ref 0.0–0.1)
IMMATURE GRANULOCYTES: 0 %
LYMPHS: 25 %
Lymphocytes Absolute: 1.5 10*3/uL (ref 0.7–3.1)
MCH: 32.5 pg (ref 26.6–33.0)
MCHC: 34.9 g/dL (ref 31.5–35.7)
MCV: 93 fL (ref 79–97)
MONOCYTES: 9 %
Monocytes Absolute: 0.5 10*3/uL (ref 0.1–0.9)
NEUTROS ABS: 3.7 10*3/uL (ref 1.4–7.0)
NEUTROS PCT: 63 %
PLATELETS: 238 10*3/uL (ref 150–379)
RBC: 4.52 x10E6/uL (ref 4.14–5.80)
RDW: 13.5 % (ref 12.3–15.4)
WBC: 5.8 10*3/uL (ref 3.4–10.8)

## 2016-05-27 LAB — NMR, LIPOPROFILE
Cholesterol: 143 mg/dL (ref 100–199)
HDL Cholesterol by NMR: 46 mg/dL (ref >39)
HDL Particle Number: 29.1 umol/L — ABNORMAL LOW (ref >=30.5)
LDL PARTICLE NUMBER: 950 nmol/L (ref <1000)
LDL SIZE: 20.5 nm (ref >20.5)
LDL-C: 76 mg/dL (ref 0–99)
LP-IR SCORE: 32 (ref <=45)
SMALL LDL PARTICLE NUMBER: 452 nmol/L (ref <=527)
TRIGLYCERIDES BY NMR: 103 mg/dL (ref 0–149)

## 2016-05-27 LAB — BMP8+EGFR
BUN/Creatinine Ratio: 12 (ref 10–24)
BUN: 11 mg/dL (ref 10–36)
CALCIUM: 9.1 mg/dL (ref 8.6–10.2)
CHLORIDE: 102 mmol/L (ref 96–106)
CO2: 25 mmol/L (ref 18–29)
Creatinine, Ser: 0.94 mg/dL (ref 0.76–1.27)
GFR, EST AFRICAN AMERICAN: 82 mL/min/{1.73_m2} (ref >59)
GFR, EST NON AFRICAN AMERICAN: 71 mL/min/{1.73_m2} (ref >59)
Glucose: 107 mg/dL — ABNORMAL HIGH (ref 65–99)
Potassium: 4.3 mmol/L (ref 3.5–5.2)
Sodium: 141 mmol/L (ref 134–144)

## 2016-05-27 LAB — HEPATIC FUNCTION PANEL
ALT: 14 IU/L (ref 0–44)
AST: 20 IU/L (ref 0–40)
Albumin: 3.9 g/dL (ref 3.2–4.6)
Alkaline Phosphatase: 74 IU/L (ref 39–117)
Bilirubin Total: 0.5 mg/dL (ref 0.0–1.2)
Bilirubin, Direct: 0.15 mg/dL (ref 0.00–0.40)
Total Protein: 6.6 g/dL (ref 6.0–8.5)

## 2016-05-27 LAB — VITAMIN D 25 HYDROXY (VIT D DEFICIENCY, FRACTURES): Vit D, 25-Hydroxy: 42.9 ng/mL (ref 30.0–100.0)

## 2016-06-02 DIAGNOSIS — H43813 Vitreous degeneration, bilateral: Secondary | ICD-10-CM | POA: Diagnosis not present

## 2016-06-02 DIAGNOSIS — Z961 Presence of intraocular lens: Secondary | ICD-10-CM | POA: Diagnosis not present

## 2016-06-02 DIAGNOSIS — H26491 Other secondary cataract, right eye: Secondary | ICD-10-CM | POA: Diagnosis not present

## 2016-06-02 DIAGNOSIS — H35372 Puckering of macula, left eye: Secondary | ICD-10-CM | POA: Diagnosis not present

## 2016-06-29 DIAGNOSIS — Z8546 Personal history of malignant neoplasm of prostate: Secondary | ICD-10-CM | POA: Diagnosis not present

## 2016-06-29 DIAGNOSIS — N320 Bladder-neck obstruction: Secondary | ICD-10-CM | POA: Diagnosis not present

## 2016-08-10 DIAGNOSIS — H43813 Vitreous degeneration, bilateral: Secondary | ICD-10-CM | POA: Diagnosis not present

## 2016-08-10 DIAGNOSIS — Z961 Presence of intraocular lens: Secondary | ICD-10-CM | POA: Diagnosis not present

## 2016-08-10 DIAGNOSIS — H35372 Puckering of macula, left eye: Secondary | ICD-10-CM | POA: Diagnosis not present

## 2016-08-10 DIAGNOSIS — H26491 Other secondary cataract, right eye: Secondary | ICD-10-CM | POA: Diagnosis not present

## 2016-09-28 ENCOUNTER — Ambulatory Visit (INDEPENDENT_AMBULATORY_CARE_PROVIDER_SITE_OTHER): Payer: Medicare Other | Admitting: Family Medicine

## 2016-09-28 ENCOUNTER — Encounter: Payer: Self-pay | Admitting: Family Medicine

## 2016-09-28 VITALS — BP 119/78 | HR 67 | Temp 96.8°F | Ht 71.0 in | Wt 201.0 lb

## 2016-09-28 DIAGNOSIS — C61 Malignant neoplasm of prostate: Secondary | ICD-10-CM

## 2016-09-28 DIAGNOSIS — E559 Vitamin D deficiency, unspecified: Secondary | ICD-10-CM | POA: Diagnosis not present

## 2016-09-28 DIAGNOSIS — K219 Gastro-esophageal reflux disease without esophagitis: Secondary | ICD-10-CM | POA: Diagnosis not present

## 2016-09-28 DIAGNOSIS — E784 Other hyperlipidemia: Secondary | ICD-10-CM | POA: Diagnosis not present

## 2016-09-28 DIAGNOSIS — I10 Essential (primary) hypertension: Secondary | ICD-10-CM

## 2016-09-28 DIAGNOSIS — H833X1 Noise effects on right inner ear: Secondary | ICD-10-CM | POA: Diagnosis not present

## 2016-09-28 DIAGNOSIS — E7849 Other hyperlipidemia: Secondary | ICD-10-CM

## 2016-09-28 NOTE — Patient Instructions (Addendum)
Medicare Annual Wellness Visit  [facility] and the medical providers at [facility] strive to bring you the best medical care.  In doing so we not only want to address your current medical conditions and concerns but also to detect new conditions early and prevent illness, disease and health-related problems.    Medicare offers a yearly Wellness Visit which allows our clinical staff to assess your need for preventative services including immunizations, lifestyle education, counseling to decrease risk of preventable diseases and screening for fall risk and other medical concerns.    This visit is provided free of charge (no copay) for all Medicare recipients. The clinical pharmacists at [facility] have begun to conduct these Wellness Visits which will also include a thorough review of all your medications.    As you primary medical provider recommend that you make an appointment for your Annual Wellness Visit if you have not done so already this year.  You may set up this appointment before you leave today or you may call back [phone] and schedule an appointment.  Please make sure when you call that you mention that you are scheduling your Annual Wellness Visit with the clinical pharmacist so that the appointment may be made for the proper length of time.    Continue current medications. Continue good therapeutic lifestyle changes which include good diet and exercise. Fall precautions discussed with patient. If an FOBT was given today- please return it to our front desk. If you are over 43 years old - you may need Prevnar 87 or the adult Pneumonia vaccine.  **Flu shots are available--- please call and schedule a FLU-CLINIC appointment**  After your visit with [place] today you will receive a survey in the mail or online from [employer] regarding your care with [place]. Please take a moment to fill this out. Your feedback is very  important to [place] as you can help [place] better understand your patient needs as well as improve your experience and satisfaction. WE CARE ABOUT YOU!!!   Please bring a note when you get your flu shot stating that you had this done so that we can enter this into our records Please continue to be careful do not put yourself at risk for falling This winter drink plenty of fluids and stay well hydrated and exercise as much as possible.

## 2016-09-28 NOTE — Progress Notes (Signed)
Subjective:    Patient ID: Alec Davis, male    DOB: [birth year], 82 y.o.   MRN: [id number]  HPI Pt here for follow up and management of chronic medical problems which includes hypertension and hyperlipidemia. He is taking medications regularly. The patient is doing well overall. He continues to be followed by the urologist for his prostate cancer. He'll be given an FOBT to return and will return to the office for fasting lab work. He gets his flu shot at a different location and we will remind him to get a note from them saying that he has had his flu shot so we can document that and our records here. The patient is concerned about his wife because she has a fractured humerus. They're both elderly and trying to maintain her independence as much as possible. The patient denies any chest pain or shortness of breath. He denies any trouble with his stomach including heartburn indigestion nausea vomiting diarrhea or blood in the stool. He sees the urologist, Dr. Rosana Hoes on a yearly basis and we'll see him again in January. He is kind and pleasant and alert and very attentive taking care of his wife.    Patient Active Problem List   Diagnosis Date Noted  . Hyperlipemia 08/03/2013  . Depression 08/03/2013  . Vitamin D deficiency 03/07/2011  . GERD (gastroesophageal reflux disease) 03/07/2011  . Essential hypertension 01/19/2011  . RIGHT BUNDLE BRANCH BLOCK 01/19/2011  . INTERNAL HEMORRHOIDS 01/19/2011  . DIVERTICULOSIS, COLON 01/19/2011  . COLONIC POLYPS, HX OF 01/19/2011   Outpatient Encounter Prescriptions as of 09/28/2016  Medication Sig  . aspirin 81 MG EC tablet Take 81 mg by mouth daily after supper.    . Calcium-Magnesium-Vitamin D (CITRACAL CALCIUM+D) 600-40-500 MG-MG-UNIT TB24 Take 2 tablets by mouth 1 dose over 46 hours.    . lovastatin (MEVACOR) 20 MG tablet TAKE 1 TABLET DAILY  . multivitamin (THERAGRAN) per tablet Take 1 tablet by mouth daily.    . niacin (NIASPAN) 750 MG CR  tablet TAKE ONE TABLET BY MOUTH ONE TIME DAILY  . tamsulosin (FLOMAX) 0.4 MG CAPS capsule Take 1 capsule (0.4 mg total) by mouth daily.  . Vitamin D, Cholecalciferol, 1000 UNITS TABS Take 1 tablet by mouth daily.   No facility-administered encounter medications on file as of 09/28/2016.       Review of Systems  Constitutional: Negative.   HENT: Negative.   Eyes: Negative.   Respiratory: Negative.   Cardiovascular: Negative.   Gastrointestinal: Negative.   Endocrine: Negative.   Genitourinary: Negative.   Musculoskeletal: Negative.   Skin: Negative.   Allergic/Immunologic: Negative.   Neurological: Negative.   Hematological: Negative.   Psychiatric/Behavioral: Negative.        Objective:   Physical Exam  Constitutional: He is oriented to person, place, and time. He appears well-developed and well-nourished.  Patient is pleasant and alert  HENT:  Head: Normocephalic and atraumatic.  Right Ear: External ear normal.  Left Ear: External ear normal.  Nose: Nose normal.  Mouth/Throat: Oropharynx is clear and moist. No oropharyngeal exudate.  He wears a hearing aid in the right ear  Eyes: Conjunctivae and EOM are normal. Pupils are equal, round, and reactive to light. Right eye exhibits no discharge. Left eye exhibits no discharge. No scleral icterus.  Neck: Normal range of motion. Neck supple. No thyromegaly present.  No bruits or thyromegaly  Cardiovascular: Normal rate and regular rhythm.  Exam reveals no friction rub.   No murmur heard.  Pulses in the lower extremity were difficult to palpate. Heart is regular at 68/m  Pulmonary/Chest: Effort normal and breath sounds normal. No respiratory distress. He has no wheezes. He has no rales. He exhibits no tenderness.  Clear anteriorly and posteriorly no axillary adenopathy  Abdominal: Soft. Bowel sounds are normal. He exhibits no mass. There is no tenderness. There is no rebound and no guarding.  No abdominal tenderness masses or  bruits or inguinal adenopathy. Patient had good inguinal pulses bilaterally.  Genitourinary:  Genitourinary Comments: The patient will see the urologist in January  Musculoskeletal: Normal range of motion. He exhibits no edema.  Some increased back pain with movement and bending and sitting up.  Lymphadenopathy:    He has no cervical adenopathy.  Neurological: He is alert and oriented to person, place, and time. He has normal reflexes. No cranial nerve deficit.  Skin: Skin is warm and dry. No rash noted.  Psychiatric: He has a normal mood and affect. His behavior is normal. Judgment and thought content normal.  The patient is pleasant and positive and alert  Nursing note and vitals reviewed.  BP 119/78 (BP Location: Right Arm)   Pulse 67   Temp (!) 96.8 F (36 C) (Oral)   Ht 5' 11"  (1.803 m)   Wt 201 lb (91.2 kg)   BMI 28.03 kg/m         Assessment & Plan:  1. Other hyperlipidemia -Continue current treatment pending results of lab work - CBC with Differential/Platelet; Future - NMR, lipoprofile; Future  2. Vitamin D deficiency -Continue current treatment pending results of lab work - CBC with Differential/Platelet; Future - VITAMIN D 25 Hydroxy (Vit-D Deficiency, Fractures); Future  3. Gastroesophageal reflux disease, esophagitis presence not specified -The patient has no complaints with this and he will continue to watch his diet closely and avoid all NSAIDs - CBC with Differential/Platelet; Future - Hepatic function panel; Future  4. PROSTATE CANCER -Continue follow-up with Dr. Lawerance Bach - CBC with Differential/Platelet; Future  5. Essential hypertension -The blood pressure is good today and he will continue to watch his sodium intake - BMP8+EGFR; Future - CBC with Differential/Platelet; Future - Hepatic function panel; Future  6. Noise-induced hearing loss of right ear, unspecified hearing status on contralateral side -Consider getting a new set of hearing aids  from the [place]  Patient Instructions                       Medicare Annual Wellness Visit  [facility] and the medical providers at [facility] strive to bring you the best medical care.  In doing so we not only want to address your current medical conditions and concerns but also to detect new conditions early and prevent illness, disease and health-related problems.    Medicare offers a yearly Wellness Visit which allows our clinical staff to assess your need for preventative services including immunizations, lifestyle education, counseling to decrease risk of preventable diseases and screening for fall risk and other medical concerns.    This visit is provided free of charge (no copay) for all Medicare recipients. The clinical pharmacists at [facility] have begun to conduct these Wellness Visits which will also include a thorough review of all your medications.    As you primary medical provider recommend that you make an appointment for your Annual Wellness Visit if you have not done so already this year.  You may set up this appointment before  you leave today or you may call back ([phone]) and schedule an appointment.  Please make sure when you call that you mention that you are scheduling your Annual Wellness Visit with the clinical pharmacist so that the appointment may be made for the proper length of time.    Continue current medications. Continue good therapeutic lifestyle changes which include good diet and exercise. Fall precautions discussed with patient. If an FOBT was given today- please return it to our front desk. If you are over 45 years old - you may need Prevnar 79 or the adult Pneumonia vaccine.  **Flu shots are available--- please call and schedule a FLU-CLINIC appointment**  After your visit with [place] today you will receive a survey in the mail or online from [employer] regarding your care with [place]. Please take a moment to fill  this out. Your feedback is very important to [place] as you can help [place] better understand your patient needs as well as improve your experience and satisfaction. WE CARE ABOUT YOU!!!   Please bring a note when you get your flu shot stating that you had this done so that we can enter this into our records Please continue to be careful do not put yourself at risk for falling This winter drink plenty of fluids and stay well hydrated and exercise as much as possible.   Arrie Senate MD

## 2016-09-30 ENCOUNTER — Other Ambulatory Visit: Payer: Self-pay | Admitting: Family Medicine

## 2016-10-01 ENCOUNTER — Other Ambulatory Visit (INDEPENDENT_AMBULATORY_CARE_PROVIDER_SITE_OTHER): Payer: Medicare Other

## 2016-10-01 DIAGNOSIS — E559 Vitamin D deficiency, unspecified: Secondary | ICD-10-CM | POA: Diagnosis not present

## 2016-10-01 DIAGNOSIS — E784 Other hyperlipidemia: Secondary | ICD-10-CM | POA: Diagnosis not present

## 2016-10-01 DIAGNOSIS — C61 Malignant neoplasm of prostate: Secondary | ICD-10-CM | POA: Diagnosis not present

## 2016-10-01 DIAGNOSIS — I10 Essential (primary) hypertension: Secondary | ICD-10-CM | POA: Diagnosis not present

## 2016-10-01 DIAGNOSIS — K219 Gastro-esophageal reflux disease without esophagitis: Secondary | ICD-10-CM | POA: Diagnosis not present

## 2016-10-01 NOTE — Addendum Note (Signed)
Addended by: Wyline Mood on: 10/01/2016 08:25 AM   Modules accepted: Orders

## 2016-10-02 LAB — BMP8+EGFR
BUN/Creatinine Ratio: 13 (ref 10–24)
BUN: 13 mg/dL (ref 10–36)
CHLORIDE: 100 mmol/L (ref 96–106)
CO2: 24 mmol/L (ref 18–29)
CREATININE: 0.98 mg/dL (ref 0.76–1.27)
Calcium: 9.1 mg/dL (ref 8.6–10.2)
GFR calc Af Amer: 78 mL/min/{1.73_m2} (ref >59)
GFR calc non Af Amer: 67 mL/min/{1.73_m2} (ref >59)
GLUCOSE: 114 mg/dL — AB (ref 65–99)
Potassium: 4.3 mmol/L (ref 3.5–5.2)
Sodium: 139 mmol/L (ref 134–144)

## 2016-10-02 LAB — NMR, LIPOPROFILE
Cholesterol: 135 mg/dL (ref 100–199)
HDL CHOLESTEROL BY NMR: 47 mg/dL (ref >39)
HDL Particle Number: 31.2 umol/L (ref >=30.5)
LDL PARTICLE NUMBER: 851 nmol/L (ref <1000)
LDL Size: 20.9 nm (ref >20.5)
LDL-C: 67 mg/dL (ref 0–99)
LP-IR Score: 39 (ref <=45)
SMALL LDL PARTICLE NUMBER: 310 nmol/L (ref <=527)
TRIGLYCERIDES BY NMR: 103 mg/dL (ref 0–149)

## 2016-10-02 LAB — HEPATIC FUNCTION PANEL
ALBUMIN: 3.9 g/dL (ref 3.2–4.6)
ALT: 14 IU/L (ref 0–44)
AST: 19 IU/L (ref 0–40)
Alkaline Phosphatase: 69 IU/L (ref 39–117)
BILIRUBIN TOTAL: 0.7 mg/dL (ref 0.0–1.2)
Bilirubin, Direct: 0.19 mg/dL (ref 0.00–0.40)
TOTAL PROTEIN: 6.7 g/dL (ref 6.0–8.5)

## 2016-10-02 LAB — CBC WITH DIFFERENTIAL/PLATELET
BASOS ABS: 0.1 10*3/uL (ref 0.0–0.2)
Basos: 1 %
EOS (ABSOLUTE): 0.2 10*3/uL (ref 0.0–0.4)
EOS: 3 %
HEMATOCRIT: 41.9 % (ref 37.5–51.0)
HEMOGLOBIN: 14.3 g/dL (ref 12.6–17.7)
IMMATURE GRANULOCYTES: 0 %
Immature Grans (Abs): 0 10*3/uL (ref 0.0–0.1)
Lymphocytes Absolute: 1.5 10*3/uL (ref 0.7–3.1)
Lymphs: 23 %
MCH: 31.4 pg (ref 26.6–33.0)
MCHC: 34.1 g/dL (ref 31.5–35.7)
MCV: 92 fL (ref 79–97)
MONOCYTES: 11 %
MONOS ABS: 0.7 10*3/uL (ref 0.1–0.9)
NEUTROS PCT: 62 %
Neutrophils Absolute: 4 10*3/uL (ref 1.4–7.0)
Platelets: 226 10*3/uL (ref 150–379)
RBC: 4.55 x10E6/uL (ref 4.14–5.80)
RDW: 13.5 % (ref 12.3–15.4)
WBC: 6.4 10*3/uL (ref 3.4–10.8)

## 2016-10-02 LAB — VITAMIN D 25 HYDROXY (VIT D DEFICIENCY, FRACTURES): Vit D, 25-Hydroxy: 41.6 ng/mL (ref 30.0–100.0)

## 2016-10-06 DIAGNOSIS — Z23 Encounter for immunization: Secondary | ICD-10-CM | POA: Diagnosis not present

## 2016-11-18 DIAGNOSIS — Z961 Presence of intraocular lens: Secondary | ICD-10-CM | POA: Diagnosis not present

## 2016-11-18 DIAGNOSIS — H43813 Vitreous degeneration, bilateral: Secondary | ICD-10-CM | POA: Diagnosis not present

## 2016-11-18 DIAGNOSIS — H26491 Other secondary cataract, right eye: Secondary | ICD-10-CM | POA: Diagnosis not present

## 2016-11-18 DIAGNOSIS — H35372 Puckering of macula, left eye: Secondary | ICD-10-CM | POA: Diagnosis not present

## 2016-11-19 ENCOUNTER — Other Ambulatory Visit: Payer: Self-pay | Admitting: Family Medicine

## 2016-12-17 ENCOUNTER — Inpatient Hospital Stay (HOSPITAL_COMMUNITY)
Admission: AD | Admit: 2016-12-17 | Discharge: 2016-12-22 | DRG: 308 | Disposition: A | Discharge status: Home Health | Payer: Medicare Other | Source: Other Acute Inpatient Hospital | Attending: Family Medicine | Admitting: Family Medicine

## 2016-12-17 ENCOUNTER — Encounter (HOSPITAL_COMMUNITY): Payer: Self-pay | Admitting: Cardiology

## 2016-12-17 DIAGNOSIS — Z8546 Personal history of malignant neoplasm of prostate: Secondary | ICD-10-CM

## 2016-12-17 DIAGNOSIS — F329 Major depressive disorder, single episode, unspecified: Secondary | ICD-10-CM | POA: Diagnosis present

## 2016-12-17 DIAGNOSIS — K219 Gastro-esophageal reflux disease without esophagitis: Secondary | ICD-10-CM | POA: Diagnosis present

## 2016-12-17 DIAGNOSIS — N400 Enlarged prostate without lower urinary tract symptoms: Secondary | ICD-10-CM | POA: Diagnosis present

## 2016-12-17 DIAGNOSIS — I441 Atrioventricular block, second degree: Secondary | ICD-10-CM | POA: Diagnosis not present

## 2016-12-17 DIAGNOSIS — I459 Conduction disorder, unspecified: Secondary | ICD-10-CM | POA: Diagnosis present

## 2016-12-17 DIAGNOSIS — Z7982 Long term (current) use of aspirin: Secondary | ICD-10-CM

## 2016-12-17 DIAGNOSIS — E785 Hyperlipidemia, unspecified: Secondary | ICD-10-CM | POA: Diagnosis present

## 2016-12-17 DIAGNOSIS — Z820 Family history of epilepsy and other diseases of the nervous system: Secondary | ICD-10-CM | POA: Diagnosis not present

## 2016-12-17 DIAGNOSIS — I248 Other forms of acute ischemic heart disease: Secondary | ICD-10-CM

## 2016-12-17 DIAGNOSIS — F0390 Unspecified dementia without behavioral disturbance: Secondary | ICD-10-CM | POA: Diagnosis present

## 2016-12-17 DIAGNOSIS — I10 Essential (primary) hypertension: Secondary | ICD-10-CM | POA: Diagnosis not present

## 2016-12-17 DIAGNOSIS — E784 Other hyperlipidemia: Secondary | ICD-10-CM | POA: Diagnosis not present

## 2016-12-17 DIAGNOSIS — R061 Stridor: Secondary | ICD-10-CM | POA: Diagnosis not present

## 2016-12-17 DIAGNOSIS — J189 Pneumonia, unspecified organism: Secondary | ICD-10-CM | POA: Diagnosis present

## 2016-12-17 DIAGNOSIS — R262 Difficulty in walking, not elsewhere classified: Secondary | ICD-10-CM

## 2016-12-17 DIAGNOSIS — I700 Atherosclerosis of aorta: Secondary | ICD-10-CM | POA: Diagnosis not present

## 2016-12-17 DIAGNOSIS — K2970 Gastritis, unspecified, without bleeding: Secondary | ICD-10-CM | POA: Diagnosis not present

## 2016-12-17 DIAGNOSIS — I4581 Long QT syndrome: Secondary | ICD-10-CM | POA: Diagnosis present

## 2016-12-17 DIAGNOSIS — F32A Depression, unspecified: Secondary | ICD-10-CM | POA: Diagnosis present

## 2016-12-17 DIAGNOSIS — R9431 Abnormal electrocardiogram [ECG] [EKG]: Secondary | ICD-10-CM | POA: Diagnosis not present

## 2016-12-17 DIAGNOSIS — I4510 Unspecified right bundle-branch block: Secondary | ICD-10-CM | POA: Diagnosis not present

## 2016-12-17 DIAGNOSIS — K8020 Calculus of gallbladder without cholecystitis without obstruction: Secondary | ICD-10-CM | POA: Diagnosis not present

## 2016-12-17 DIAGNOSIS — R778 Other specified abnormalities of plasma proteins: Secondary | ICD-10-CM | POA: Diagnosis present

## 2016-12-17 DIAGNOSIS — R0789 Other chest pain: Secondary | ICD-10-CM | POA: Diagnosis not present

## 2016-12-17 DIAGNOSIS — R112 Nausea with vomiting, unspecified: Secondary | ICD-10-CM | POA: Diagnosis not present

## 2016-12-17 DIAGNOSIS — Z79899 Other long term (current) drug therapy: Secondary | ICD-10-CM

## 2016-12-17 DIAGNOSIS — C61 Malignant neoplasm of prostate: Secondary | ICD-10-CM | POA: Diagnosis not present

## 2016-12-17 DIAGNOSIS — H9190 Unspecified hearing loss, unspecified ear: Secondary | ICD-10-CM | POA: Diagnosis present

## 2016-12-17 DIAGNOSIS — E559 Vitamin D deficiency, unspecified: Secondary | ICD-10-CM | POA: Diagnosis present

## 2016-12-17 DIAGNOSIS — R1013 Epigastric pain: Secondary | ICD-10-CM | POA: Diagnosis not present

## 2016-12-17 DIAGNOSIS — R1110 Vomiting, unspecified: Secondary | ICD-10-CM | POA: Diagnosis not present

## 2016-12-17 DIAGNOSIS — R10812 Left upper quadrant abdominal tenderness: Secondary | ICD-10-CM | POA: Diagnosis not present

## 2016-12-17 LAB — TROPONIN I
TROPONIN I: 0.03 ng/mL — AB (ref <0.03)
Troponin I: 0.04 ng/mL (ref <0.03)
Troponin I: 0.1 ng/mL (ref <0.03)

## 2016-12-17 LAB — TSH: TSH: 0.931 u[IU]/mL (ref 0.350–4.500)

## 2016-12-17 MED ORDER — NITROGLYCERIN 0.4 MG SL SUBL: 0.4000 mg | SUBLINGUAL_TABLET | SUBLINGUAL | Status: DC | PRN

## 2016-12-17 MED ORDER — ACETAMINOPHEN 650 MG RE SUPP: 650.0000 mg | Freq: Four times a day (QID) | RECTAL | Status: DC | PRN

## 2016-12-17 MED ORDER — ADULT MULTIVITAMIN W/MINERALS CH
1.0000 | ORAL_TABLET | Freq: Every day | ORAL | Status: DC
  Administered 2016-12-17: 1 via ORAL
  Filled 2016-12-17: qty 1

## 2016-12-17 MED ORDER — VITAMIN D 1000 UNITS PO TABS
1000.0000 [IU] | ORAL_TABLET | Freq: Every day | ORAL | Status: DC
  Administered 2016-12-17: 1000 [IU] via ORAL
  Filled 2016-12-17: qty 1

## 2016-12-17 MED ORDER — MORPHINE SULFATE (PF) 2 MG/ML IV SOLN: 2.0000 mg | INTRAVENOUS | Status: DC | PRN

## 2016-12-17 MED ORDER — ACETAMINOPHEN 325 MG PO TABS
650.0000 mg | ORAL_TABLET | Freq: Four times a day (QID) | ORAL | Status: DC | PRN
  Administered 2016-12-20 – 2016-12-21 (×2): 650 mg via ORAL
  Filled 2016-12-17 (×3): qty 2

## 2016-12-17 MED ORDER — PRAVASTATIN SODIUM 20 MG PO TABS
20.0000 mg | ORAL_TABLET | Freq: Every day | ORAL | Status: DC
  Administered 2016-12-17: 20 mg via ORAL
  Filled 2016-12-17: qty 1

## 2016-12-17 MED ORDER — TAMSULOSIN HCL 0.4 MG PO CAPS
0.4000 mg | ORAL_CAPSULE | Freq: Every day | ORAL | Status: DC
Start: 2016-12-17 — End: 2016-12-22
  Administered 2016-12-17 – 2016-12-22 (×6): 0.4 mg via ORAL
  Filled 2016-12-17 (×6): qty 1

## 2016-12-17 MED ORDER — SODIUM CHLORIDE 0.9% FLUSH
3.0000 mL | Freq: Two times a day (BID) | INTRAVENOUS | Status: DC
  Administered 2016-12-17 – 2016-12-20 (×5): 3 mL via INTRAVENOUS

## 2016-12-17 MED ORDER — ENOXAPARIN SODIUM 40 MG/0.4ML ~~LOC~~ SOLN
40.0000 mg | Freq: Every day | SUBCUTANEOUS | Status: DC
  Administered 2016-12-17 – 2016-12-22 (×6): 40 mg via SUBCUTANEOUS
  Filled 2016-12-17 (×6): qty 0.4

## 2016-12-17 MED ORDER — NIACIN ER 250 MG PO CPCR
750.0000 mg | ORAL_CAPSULE | Freq: Every day | ORAL | Status: DC
  Administered 2016-12-17: 750 mg via ORAL
  Filled 2016-12-17 (×2): qty 3

## 2016-12-17 MED ORDER — ASPIRIN EC 81 MG PO TBEC
81.0000 mg | DELAYED_RELEASE_TABLET | ORAL | Status: DC
  Administered 2016-12-17 – 2016-12-21 (×4): 81 mg via ORAL
  Filled 2016-12-17 (×4): qty 1

## 2016-12-17 MED ORDER — CALCIUM CARBONATE-VITAMIN D 500-200 MG-UNIT PO TABS: 2.0000 | ORAL_TABLET | Freq: Every day | ORAL | Status: DC

## 2016-12-17 NOTE — Progress Notes (Addendum)
This is a no charge note  Transfer from [facility] per Dr. Mayra Reel.  82 year old man with past medical history of hypertension, hyperlipidemia, GERD, depression, prostate cancer, BPH, who presents with chest pain, shortness of breath and lightheadedness. EKG showed new type II second-degree AV block. Hemodynamically stable. Blood pressure 164/83, heart rate 79, O2 saturation 99% on room air, temperature normal. Negative chest x-ray. Electrolytes okay. WBC 10.9. Initial troponin negative. Per EDP, pt has low risk for PE. Due to lack of tele bed and cardiology coverage, patient is transferred to [facility]. Accepted tele bed as inpt.  Ivor Costa, MD  [facility] Pager [phone]  If 7PM-7AM, please contact night-coverage [email or web address] Password [email or web address] 12/17/2016, 4:02 AM

## 2016-12-17 NOTE — H&P (Signed)
History and Physical    Alec Davis AXK:[id number] DOB: 1923-12-10 DOA: 12/17/2016   PCP: Redge Gainer, MD   Patient coming from/Resides with: Private residence/lives with wife  Admission status: Inpatient/telemetry -medically necessary to stay a minimum 2 midnights to rule out impending and/or unexpected changes in physiologic status that may differ from initial evaluation performed in the ER and/or at time of admission. Patient sent from outside facility after developing epigastric pain and was found to have a new second-degree AV block (Wenckebach) and was subsequently transferred to [facility] for further evaluation and treatment and formal cardiology consultation. Patient will require continuous telemetry monitoring, cycling of troponins to rule out ischemic etiology, an echocardiogram.  Chief Complaint: Epigastric pain, second-degree AV block2  HPI: Alec Davis is a 82 y.o. male with medical history significant for prostate cancer/BPH, dyslipidemia and reflux. Patient hard of hearing so wife assisted with history. Patient began developing epigastric pain around 10:30 yesterday evening which was constant and quite severe. Patient was nausea without emesis and was apparently having lightheadedness. During transport on the ambulance patient did have an episode of emesis. He has no prior GI history such as ulcer disease. He still has his gallbladder. He has no cardiac history. He he has not had recent issues with dyspnea on exertion or chest pain with exertion.  ED Course [facility]):  Lab data: Sodium 133, potassium 3.8, BUN 16, creatinine 0.89, glucose 183, alk phosphatase 87 and total bilirubin/AST/ALT within normal limits, white count 10,900, hemoglobin 14.5, platelets 236,000, troponin less than 0.01, urinalysis negative nitrite and negative leukocyte esterase and moderate bacteria PCXR: Negative, solitary gallstone  Review of Systems:  In addition to the HPI  above,  No Fever-chills, myalgias or other constitutional symptoms No Headache, changes with Vision or hearing, new weakness, tingling, numbness in any extremity, dizziness, dysarthria or word finding difficulty, gait disturbance or imbalance, tremors or seizure activity-parent subjective report of lightheadedness at previous hospital No problems swallowing food or Liquids, indigestion/reflux, choking or coughing while eating, abdominal pain with or after eating No Chest pain, Cough or Shortness of Breath, palpitations, orthopnea or DOE No melena,hematochezia, dark tarry stools, constipation No dysuria, malodorous urine, hematuria or flank pain No new skin rashes, lesions, masses or bruises, No new joint pains, aches, swelling or redness No recent unintentional weight gain or loss No polyuria, polydypsia or polyphagia   Past Medical History:  Diagnosis Date  . BPH (benign prostatic hyperplasia)   . Cataract   . GERD (gastroesophageal reflux disease)   . Hyperlipidemia   . Hypertension   . PROSTATE CANCER 01/19/2011   Qualifier: Diagnosis of  By: Nils Pyle CMA ([place]), [name]    . Vitamin D deficiency     Past Surgical History:  Procedure Laterality Date  . cataracts  Bilateral    Dr [name]  . SPINE SURGERY      Social History   Social History  . Marital status: Married    Spouse name: N/A  . Number of children: N/A  . Years of education: N/A   Occupational History  . Not on file.   Social History Main Topics  . Smoking status: Former Smoker    Quit date: 01/25/1950  . Smokeless tobacco: Never Used  . Alcohol use No  . Drug use: No  . Sexual activity: No   Other Topics Concern  . Not on file   Social History Narrative  . No narrative on file    Mobility: Without assistive  devices Work history: Not obtained   No Known Allergies  Family history reviewed and not pertinent Given patient's advanced age and current presenting symptomatology  Prior to Admission  medications   Medication Sig Start Date End Date Taking? Authorizing Provider  aspirin 81 MG EC tablet Take 81 mg by mouth daily after supper.     Yes Historical Provider, MD  Calcium Carb-Cholecalciferol (CALCIUM 600 + D PO) Take 1 tablet by mouth daily.   Yes Historical Provider, MD  lovastatin (MEVACOR) 20 MG tablet TAKE 1 TABLET DAILY Patient taking differently: Take 20 mg by mouth daily 11/19/16  Yes Chipper Herb, MD  niacin (NIASPAN) 750 MG CR tablet TAKE 1 TABLET DAILY Patient taking differently: Take 750 mg by mouth daily 09/30/16  Yes Chipper Herb, MD  tamsulosin (FLOMAX) 0.4 MG CAPS capsule Take 1 capsule (0.4 mg total) by mouth daily. 12/05/14  Yes Chipper Herb, MD  Vitamin D, Cholecalciferol, 1000 UNITS TABS Take 1,000 Units by mouth daily.    Yes Historical Provider, MD    Physical Exam: Vitals:   12/17/16 0601  BP: (!) 152/88  Pulse: (!) 101  Resp: 18  Temp: 97.5 F (36.4 C)  TempSrc: Axillary  SpO2: 99%  Weight: 89.8 kg (198 lb)      Constitutional: NAD, calm, comfortable Eyes: PERRL, lids and conjunctivae normal ENMT: Mucous membranes are moist. Posterior pharynx clear of any exudate or lesions.Normal dentition.  Neck: normal, supple, no masses, no thyromegaly Respiratory: clear to auscultation bilaterally, no wheezing, no crackles. Normal respiratory effort. No accessory muscle use.  Cardiovascular: Regular rate and rhythm, no murmurs / rubs / gallops. No extremity edema. 2+ pedal pulses. No carotid bruits.  Abdomen: Mild tenderness reproduced with palpation over epigastrium, no masses palpated. No hepatosplenomegaly. Bowel sounds positive.  Musculoskeletal: no clubbing / cyanosis. No joint deformity upper and lower extremities. Good ROM, no contractures. Normal muscle tone.  Skin: no rashes, lesions, ulcers. No induration Neurologic: CN 2-12 grossly intact except for very hard of hearing. Sensation intact, DTR normal. Strength 5/5 x all 4 extremities.    Psychiatric: Normal judgment and insight. Alert and oriented x 3. Normal mood.    Labs on Admission: I have personally reviewed following labs and imaging studies  CBC: No results for input(s): WBC, NEUTROABS, HGB, HCT, MCV, PLT in the last 168 hours. Basic Metabolic Panel: No results for input(s): NA, K, CL, CO2, GLUCOSE, BUN, CREATININE, CALCIUM, MG, PHOS in the last 168 hours. GFR: CrCl cannot be calculated (Patient's most recent lab result is older than the maximum 21 days allowed.). Liver Function Tests: No results for input(s): AST, ALT, ALKPHOS, BILITOT, PROT, ALBUMIN in the last 168 hours. No results for input(s): LIPASE, AMYLASE in the last 168 hours. No results for input(s): AMMONIA in the last 168 hours. Coagulation Profile: No results for input(s): INR, PROTIME in the last 168 hours. Cardiac Enzymes:  Recent Labs Lab 12/17/16 0654  TROPONINI 0.03*   BNP (last 3 results) No results for input(s): PROBNP in the last 8760 hours. HbA1C: No results for input(s): HGBA1C in the last 72 hours. CBG: No results for input(s): GLUCAP in the last 168 hours. Lipid Profile: No results for input(s): CHOL, HDL, LDLCALC, TRIG, CHOLHDL, LDLDIRECT in the last 72 hours. Thyroid Function Tests: No results for input(s): TSH, T4TOTAL, FREET4, T3FREE, THYROIDAB in the last 72 hours. Anemia Panel: No results for input(s): VITAMINB12, FOLATE, FERRITIN, TIBC, IRON, RETICCTPCT in the last 72 hours. Urine analysis:  Component Value Date/Time   BILIRUBINUR neg 01/22/2016 1600   PROTEINUR neg 01/22/2016 1600   UROBILINOGEN negative 01/22/2016 1600   NITRITE neg 01/22/2016 1600   LEUKOCYTESUR Negative 01/22/2016 1600   Sepsis Labs: @LABRCNTIP (procalcitonin:4,lacticidven:4) )No results found for this or any previous visit (from the past 240 hour(s)).   Radiological Exams on Admission: No results found.  EKG: (Independently reviewed) (From [facility]): Second degree AV block  type II with underlying right bundle branch block, rate 79 bpm, QTC 520 ms  Assessment/Plan Principal Problem:   Second degree AV block, Mobitz type 2 -Presented to outside hospital with epigastric pain with findings of second-degree AV block type II -Current telemetry readings appear more consistent with accelerated junctional rhythm/junctional tachycardia -Cardiology consulted -Not on offending medications prior to admission -Echocardiogram -Cycle troponin -TSH -Continue baby aspirin  Active Problems:   Right bundle branch block -Uncertain if chronic or new noting no old EKG for comparison -QTC slightly prolonged at 520 likely related to underlying right bundle -Continue telemetry   Abdominal pain, epigastric    Prostate cancer/BPH -Followed by outpatient urology -Continue Flomax    Essential hypertension -Was not on medications prior to admission -Current blood pressure moderately controlled    Epigastric pain/GERD (gastroesophageal reflux disease) -Patient developed new onset epigastric pain in relation to new cardiac arrhythmia -Pain is reproducible on palpation -LFTs are normal; noted with solitary gallstone incidental finding on chest x-ray -Continue to follow -Add PPI    Hyperlipemia -Continue Pravachol and niacin    Depression -Not treated with medications      DVT prophylaxis: Lovenox Code Status: Full Family Communication: Wife and daughter at bedside Disposition Plan: Expect discharge to preadmission home environment when medically stable Consults called: Cardiology/[facility] on-call physician    Samella Parr ANP-BC [facility] Pager [phone]   If 7PM-7AM, please contact night-coverage [email or web address] Password [facility]  12/17/2016, 9:13 AM

## 2016-12-17 NOTE — Consult Note (Addendum)
Cardiology Consult    Patient ID: Alec Davis MRN: [id number], DOB/AGE: Jul 29, 1923   Admit date: 12/17/2016 Date of Consult: 12/17/2016  Primary Physician: Alec Gainer, MD Reason for Consult: Second degree AV block and epigastric pain Primary Cardiologist: None- New [facility] Requesting Provider: Dr Alec Davis  Patient Profile    82 yo male patient with PMH of prostate cancer/BPH, dyslipidemia and reflux presented to [facility] for severe epigastric pain and was found to have RBBB and second degree AVB Mobitz 1. The patient has no cardiac history and no previous EKG for comparison.   History of Present Illness    The patient developed severe epigastric pain with nausea last evening at about 10:30. His neighbor says that he was mildly short of breath with the pain. He presented to the [facility] ED and EKG showed RBBB which is unknown if it is new and Second degree AVB. His troponin was negative.  His pain was reproducible with palpation. At [facility] an abdominal ultrasound ruled out aortic dissection and Xray showed gallstone in the RUQ and normal gas pattern.   He was transferred to [facility] for evaluation by cardiology. During transport the patient had an episode of emesis. He has received several doses of morphine for pain and is pain free currently. He has mild tenderness with palpation of the upper mid abdomen.   He denies any chest pain, shortness of breath or palpitations. He denies any recent chest pain or shortness of breath over the last few weeks. His usual activity level has not changed recently.  He has never smoked except for a pipe in college and has no family cardiac history that he can recall.   Past Medical History   Past Medical History:  Diagnosis Date  . BPH (benign prostatic hyperplasia)   . Cataract   . GERD (gastroesophageal reflux disease)   . Hyperlipidemia   . Hypertension   . PROSTATE CANCER 01/19/2011   Qualifier: Diagnosis  of  By: Nils Pyle CMA ([place]), [name]    . Vitamin D deficiency     Past Surgical History:  Procedure Laterality Date  . cataracts  Bilateral    Dr [name]  . SPINE SURGERY       Allergies  No Known Allergies  Inpatient Medications    . aspirin EC  81 mg Oral PC supper  . calcium-vitamin D  2 tablet Oral Q breakfast  . cholecalciferol  1,000 Units Oral Daily  . enoxaparin (LOVENOX) injection  40 mg Subcutaneous Daily  . multivitamin with minerals  1 tablet Oral Daily  . niacin  750 mg Oral Daily  . pravastatin  20 mg Oral q1800  . sodium chloride flush  3 mL Intravenous Q12H  . tamsulosin  0.4 mg Oral Daily    Family History    Family History  Problem Relation Age of Onset  . Alzheimer's disease Brother     Social History    Social History   Social History  . Marital status: Married    Spouse name: N/A  . Number of children: N/A  . Years of education: N/A   Occupational History  . Not on file.   Social History Main Topics  . Smoking status: Former Smoker    Quit date: 01/25/1950  . Smokeless tobacco: Never Used  . Alcohol use No  . Drug use: No  . Sexual activity: No   Other Topics Concern  . Not on file   Social History Narrative  .  No narrative on file     Review of Systems    General:  No chills, fever, night sweats or weight changes.  Cardiovascular:  No chest pain, dyspnea on exertion, edema, orthopnea, palpitations, paroxysmal nocturnal dyspnea. Dermatological: No rash, lesions/masses Respiratory: No cough, dyspnea Urologic: No hematuria, dysuria Abdominal:   No nausea, vomiting, diarrhea, bright red blood per rectum, melena, or hematemesis Neurologic:  No visual changes, wkns, changes in mental status. All other systems reviewed and are otherwise negative except as noted above.  Physical Exam    Blood pressure 140/75, pulse 97, temperature 98 F (36.7 C), temperature source Oral, resp. rate 18, weight 198 lb (89.8 kg), SpO2 100 %.  General:  Pleasant elderly male, NAD Psych: Normal affect. Neuro: Alert and oriented X 3. Moves all extremities spontaneously. HEENT: Normal, hard of hearing  Neck: Supple without bruits or JVD. Lungs:  Resp regular and unlabored, CTA. Heart: RRR no s3, s4, or murmurs. Abdomen: Soft, non-distended, BS + x 4. Mild tenderness in mid upper abdomen Extremities: No clubbing, cyanosis or edema. DP/PT/Radials 2+ and equal bilaterally.  Labs    Troponin (Point of Care Test) No results for input(s): TROPIPOC in the last 72 hours.  Recent Labs  12/17/16 0654 12/17/16 1309  TROPONINI 0.03* 0.04*   Lab Results  Component Value Date   WBC 6.4 10/01/2016   HGB 14.4 04/08/2015   HCT 41.9 10/01/2016   MCV 92 10/01/2016   PLT 226 10/01/2016   No results for input(s): NA, K, CL, CO2, BUN, CREATININE, CALCIUM, PROT, BILITOT, ALKPHOS, ALT, AST, GLUCOSE in the last 168 hours.  Invalid input(s): LABALBU Lab Results  Component Value Date   CHOL 135 10/01/2016   HDL 47 10/01/2016   LDLCALC 83 05/30/2014   TRIG 103 10/01/2016   No results found for: [facility]   Radiology Studies   CXR at [facility] 12/17/2016 There is atherosclerotic calcification in the aortic arch without focal airspace disease or pulmonary edema.  Ultra sound aorta at [facility] 12/17/2016 No abdominal aortic aneurysm.  KUB/Flatplate at [facility] 624THL Gallstone in upper right quadrant, normal gas pattern   EKG & Cardiac Imaging    EKG: Rhythm strip showed 1st degree AVB with RBBB  Tele: 1st degree AVB with RBBB and occ PVC's rates in 90's  Echocardiogram: Pending  Assessment & Plan    1. Second degree AV block Mobitz type 1 -Noted at [facility] (EKG in shadow chart) second degree AVB type 2, but upon further evaluation it appears that there are PR intervals lengthening with P burried with the T waves and is actually Type 1 -1st degree AVB here on EKG rhythm strip and telemetry -mildly elevated troponin-  0.03-->0.04. Continue to monitor troponins. Likely demand ischemia related to abdominal pain -No offending medications, avoid AV nodal blocking agents -No previous cardiac history and no previous EKG for comparison -Continue to monitor on tele -Echo pending -Creatinine 0.81 at [facility] 12/28 -No pacemaker indicated  2. Right bundle branch block -Unknown if new with no pervious EKG for comparison -QTC 520 on EKG, closer to 420 by personal measurement  -No history of syncope  3. Epigastric pain -?GI in nature vs cardiac related, pain is reproducible in mid upper abdomen -Pt still has gallbladder and single gallstone seen on xray -Aortic aneurysm ruled out by ultrasound -PPI has been initiated  4. Hypertension -Not previously treated -continue to monitor  5. Hyperlipidemia -Continue lovastatin and niacin  Signed, Daune Perch, NP-C 12/17/2016, 3:37 PM  Personally seen and examined. Agree with above.  Epigastric discomfort has subsided, no longer with any pain or nausea. He appears quite comfortable in bed currently. Looks younger than stated age. Heart regular rate and rhythm.  Close inspection of ECG shows what appear to be second-degree AV block type I. P wave appears to be buried within the T wave. No indication for pacemaker at this time. He has not had any syncopal episodes at home. Avoid AV nodal blocking agent such as metoprolol or diltiazem.  Low level troponin increase of 0.04 likely demand ischemia in the setting of his underlying possible viral gastroenteritis. Thankfully, he feels better. No evidence of abdominal aortic aneurysm on ultrasound.  Agree with echocardiogram for general assessment of cardiac function. At this time no further cardiac testing.  Candee Furbish, MD

## 2016-12-18 ENCOUNTER — Inpatient Hospital Stay (HOSPITAL_COMMUNITY): Payer: Medicare Other

## 2016-12-18 DIAGNOSIS — R9431 Abnormal electrocardiogram [ECG] [EKG]: Secondary | ICD-10-CM

## 2016-12-18 DIAGNOSIS — I459 Conduction disorder, unspecified: Secondary | ICD-10-CM | POA: Diagnosis present

## 2016-12-18 LAB — URINALYSIS, ROUTINE W REFLEX MICROSCOPIC
Bilirubin Urine: NEGATIVE
Glucose, UA: NEGATIVE mg/dL
KETONES UR: 5 mg/dL — AB
Leukocytes, UA: NEGATIVE
Nitrite: NEGATIVE
PROTEIN: 30 mg/dL — AB
Specific Gravity, Urine: 1.023 (ref 1.005–1.030)
pH: 5 (ref 5.0–8.0)

## 2016-12-18 LAB — CBC WITH DIFFERENTIAL/PLATELET
BASOS ABS: 0 10*3/uL (ref 0.0–0.1)
BASOS PCT: 0 %
EOS PCT: 0 %
Eosinophils Absolute: 0 10*3/uL (ref 0.0–0.7)
HEMATOCRIT: 44.3 % (ref 39.0–52.0)
Hemoglobin: 15.2 g/dL (ref 13.0–17.0)
LYMPHS PCT: 2 %
Lymphs Abs: 0.6 10*3/uL — ABNORMAL LOW (ref 0.7–4.0)
MCH: 32.3 pg (ref 26.0–34.0)
MCHC: 34.3 g/dL (ref 30.0–36.0)
MCV: 94.3 fL (ref 78.0–100.0)
MONO ABS: 2.1 10*3/uL — AB (ref 0.1–1.0)
Monocytes Relative: 9 %
NEUTROS ABS: 20.9 10*3/uL — AB (ref 1.7–7.7)
Neutrophils Relative %: 89 %
PLATELETS: 215 10*3/uL (ref 150–400)
RBC: 4.7 MIL/uL (ref 4.22–5.81)
RDW: 13.3 % (ref 11.5–15.5)
WBC: 23.6 10*3/uL — ABNORMAL HIGH (ref 4.0–10.5)

## 2016-12-18 LAB — COMPREHENSIVE METABOLIC PANEL
ALBUMIN: 3.1 g/dL — AB (ref 3.5–5.0)
ALT: 18 U/L (ref 17–63)
AST: 27 U/L (ref 15–41)
Alkaline Phosphatase: 65 U/L (ref 38–126)
Anion gap: 9 (ref 5–15)
BUN: 13 mg/dL (ref 6–20)
CHLORIDE: 100 mmol/L — AB (ref 101–111)
CO2: 25 mmol/L (ref 22–32)
CREATININE: 0.98 mg/dL (ref 0.61–1.24)
Calcium: 8.9 mg/dL (ref 8.9–10.3)
GFR calc Af Amer: >60 mL/min (ref >60)
GFR calc non Af Amer: >60 mL/min (ref >60)
GLUCOSE: 140 mg/dL — AB (ref 65–99)
POTASSIUM: 3.9 mmol/L (ref 3.5–5.1)
SODIUM: 134 mmol/L — AB (ref 135–145)
Total Bilirubin: 0.9 mg/dL (ref 0.3–1.2)
Total Protein: 6.6 g/dL (ref 6.5–8.1)

## 2016-12-18 LAB — CBC
HEMATOCRIT: 42.3 % (ref 39.0–52.0)
Hemoglobin: 14.3 g/dL (ref 13.0–17.0)
MCH: 31.8 pg (ref 26.0–34.0)
MCHC: 33.8 g/dL (ref 30.0–36.0)
MCV: 94.2 fL (ref 78.0–100.0)
PLATELETS: 220 10*3/uL (ref 150–400)
RBC: 4.49 MIL/uL (ref 4.22–5.81)
RDW: 13.3 % (ref 11.5–15.5)
WBC: 23.1 10*3/uL — AB (ref 4.0–10.5)

## 2016-12-18 LAB — ECHOCARDIOGRAM COMPLETE: Weight: 3168 oz

## 2016-12-18 MED ORDER — SODIUM CHLORIDE 0.9 % IV SOLN
1.5000 g | Freq: Three times a day (TID) | INTRAVENOUS | Status: DC
  Administered 2016-12-18 – 2016-12-21 (×9): 1.5 g via INTRAVENOUS
  Filled 2016-12-18 (×10): qty 1.5

## 2016-12-18 MED ORDER — SODIUM CHLORIDE 0.9 % IV SOLN
INTRAVENOUS | Status: DC
  Administered 2016-12-18 – 2016-12-22 (×4): via INTRAVENOUS

## 2016-12-18 NOTE — Progress Notes (Addendum)
PROGRESS NOTE    Alec Davis  [id number] DOB: Oct 30, 1923 DOA: 12/17/2016 PCP: Redge Gainer, MD    Brief Narrative:    82 y/o ? Hypertension Hyperlipidemia Reflux Prostate cancer Presented from [facility] with epigastric pain 10:30 a.m. 12/27 nausea without emesis and light headedness With second degree type II AV block and was transferred due to lack of cardiology cover at that hospital  Cardiology consult on admission echocardiogram ordered in addition to TSH noted right bundle branch block, unlikely for unclear if new or chronic and QTC prolonged at 520  Sodium 134 BUN/creatinine 13/0.9 and left normal range Troponins trend 0.04-->0 10 however cardiology felt that S was not an indication of ischemia White count noted to be 23 Chest x-ray from outside hospital showed single solitary gallstone   Assessment & Plan:   Principal Problem:   Second degree AV block, Mobitz type I Active Problems:   Prostate cancer ([place])   Essential hypertension   Right bundle branch block   GERD (gastroesophageal reflux disease)   Hyperlipemia   Depression   Abdominal pain, epigastric   Mobitz type 1 second degree atrioventricular block   Heart block   ? Heart block-cardiology was consulted this admission. Felt that ration had second degree Mobitz type I, echocardiogram is pending right bundle branch block noted but QTC less than 520 and actually 420  Nausea vomiting reproducible in upper abdomen Tolerated her first 12/29 No other significant issues  Leukocytosis-patient does not appear septic White count now 23 without fever nor chills--white count has been confirmed on 2 separate occasions Afebrile nontoxic Unclear source Would obtain chest x-ray given history of vomiting and get urine for urinalysis Doubt he has a meningia and abdomen seems benign  Addendum 6:24 PM Chest x-ray shows right upper lobe opacity so we will start Unasyn and get speech eval  Moderate  dementia Patient cannot tell me the name of either the present or former president, he is able to tell me this is December 2017 but is unable to say this spontaneously. He tells me he is spoken to his brother in [place] which I'm not sure is actually the case and he seems somewhat disoriented Suspicion is that patient is outside of his normal environment and mild dementia has been unmasked He is demanding to go home and got upset with the echocardiogram tech We will chat with his family when they are available-I did call   DVT prophylaxis: Lovenox Code Status: Full Family Communication: Called to chat with wife emma--no response  Disposition Plan: Inpatient   Consultants:   Cardiology   Procedures:   Echocardiogram is pending   Antimicrobials:   None currently    Subjective: awake but confused Somewhat redirectable   able to stand without assistance at the bedside No unilateral or other weakness Tolerated half of his breakfast no concerns of nausea vomiting  Objective: Vitals:   12/17/16 1101 12/17/16 1308 12/17/16 2104 12/18/16 0557  BP: (!) 149/88 140/75 124/65 123/62  Pulse: 95 97 99 92  Resp:  18 18 18   Temp:  98 F (36.7 C) 98.5 F (36.9 C) 98 F (36.7 C)  TempSrc:  Oral Oral Oral  SpO2: 98% 100% 96% 96%  Weight:        Intake/Output Summary (Last 24 hours) at 12/18/16 0745 Last data filed at 12/18/16 0447  Gross per 24 hour  Intake              330 ml  Output  310 ml  Net               20 ml   Filed Weights   12/17/16 0601  Weight: 89.8 kg (198 lb)    Examination:  General exam: Appears calm Respiratory system: Clear to auscultation. Respiratory effort normal. Cardiovascular system: S1 & S2 heard, RRR. No JVD, murmurs, rubs, gallops or clicks. No pedal edema. Gastrointestinal system: Abdomen is nondistended, soft and nontender. No organomegaly Central nervous system: Alert and oriented. No focal neurological deficits. Extremities:  Symmetric 5 x 5 power. Skin: No rashes, lesions or ulcers Psychiatry: Judgement and insight appear normal. Mood & affect appropriate.     Data Reviewed: I have personally reviewed following labs and imaging studies  CBC:  Recent Labs Lab 12/18/16 0403  WBC 23.1*  HGB 14.3  HCT 42.3  MCV 94.2  PLT XX123456   Basic Metabolic Panel:  Recent Labs Lab 12/18/16 0403  NA 134*  K 3.9  CL 100*  CO2 25  GLUCOSE 140*  BUN 13  CREATININE 0.98  CALCIUM 8.9   GFR: Estimated Creatinine Clearance: 52.3 mL/min (by C-G formula based on SCr of 0.98 mg/dL). Liver Function Tests:  Recent Labs Lab 12/18/16 0403  AST 27  ALT 18  ALKPHOS 65  BILITOT 0.9  PROT 6.6  ALBUMIN 3.1*   No results for input(s): LIPASE, AMYLASE in the last 168 hours. No results for input(s): AMMONIA in the last 168 hours. Coagulation Profile: No results for input(s): INR, PROTIME in the last 168 hours. Cardiac Enzymes:  Recent Labs Lab 12/17/16 0654 12/17/16 1309 12/17/16 1859  TROPONINI 0.03* 0.04* 0.10*   BNP (last 3 results) No results for input(s): PROBNP in the last 8760 hours. HbA1C: No results for input(s): HGBA1C in the last 72 hours. CBG: No results for input(s): GLUCAP in the last 168 hours. Lipid Profile: No results for input(s): CHOL, HDL, LDLCALC, TRIG, CHOLHDL, LDLDIRECT in the last 72 hours. Thyroid Function Tests:  Recent Labs  12/17/16 0837  TSH 0.931   Anemia Panel: No results for input(s): VITAMINB12, FOLATE, FERRITIN, TIBC, IRON, RETICCTPCT in the last 72 hours. Sepsis Labs: No results for input(s): PROCALCITON, LATICACIDVEN in the last 168 hours.  No results found for this or any previous visit (from the past 240 hour(s)).       Radiology Studies: No results found.      Scheduled Meds: . aspirin EC  81 mg Oral PC supper  . calcium-vitamin D  2 tablet Oral Q breakfast  . cholecalciferol  1,000 Units Oral Daily  . enoxaparin (LOVENOX) injection  40 mg  Subcutaneous Daily  . multivitamin with minerals  1 tablet Oral Daily  . niacin  750 mg Oral Daily  . pravastatin  20 mg Oral q1800  . sodium chloride flush  3 mL Intravenous Q12H  . tamsulosin  0.4 mg Oral Daily   Continuous Infusions:   LOS: 1 day    Time spent:     Nita Sells, MD [facility] Pager [phone]  If 7PM-7AM, please contact night-coverage [email or web address] Password [email or web address] 12/18/2016, 7:45 AM

## 2016-12-18 NOTE — Care Management Note (Signed)
Case Management Note  Patient Details  Name: Alec Davis MRN: [id number] Date of Birth: [birth year]  Subjective/Objective:      second degree AVB type 2             Action/Plan: Discharge Planning: NCM spoke to pt and gave permission to speak to wife, Mrs Handshoe. Contacted wife via phone. Pt has cane at home. Will continue to follow for dc needs.   PCP Chipper Herb   Expected Discharge Date:             Expected Discharge Plan:  Home/Self Care  In-House Referral:  NA  Discharge planning Services  CM Consult  Post Acute Care Choice:  NA Choice offered to:  NA  DME Arranged:  N/A DME Agency:  NA  HH Arranged:  NA HH Agency:  NA  Status of Service:  Completed, signed off  If discussed at [place] of Stay Meetings, dates discussed:    Additional Comments:  Erenest Rasher, RN 12/18/2016, 7:00 PM

## 2016-12-18 NOTE — Progress Notes (Signed)
Patient Name: Alec Davis Date of Encounter: 12/18/2016  Primary Cardiologist: Dr. Cliffton Asters Problem List     Principal Problem:   Second degree AV block, Mobitz type I Active Problems:   Prostate cancer [facility])   Essential hypertension   Right bundle branch block   GERD (gastroesophageal reflux disease)   Hyperlipemia   Depression   Abdominal pain, epigastric   Mobitz type 1 second degree atrioventricular block   Heart block     Subjective   Patient feels "so-so" this morning. No chest pain, shortness of breath, palpitations or dizziness. No further epigastric pain or nausea. No specific complaints.  Inpatient Medications    Scheduled Meds: . aspirin EC  81 mg Oral PC supper  . enoxaparin (LOVENOX) injection  40 mg Subcutaneous Daily  . sodium chloride flush  3 mL Intravenous Q12H  . tamsulosin  0.4 mg Oral Daily   Continuous Infusions: . sodium chloride     PRN Meds: acetaminophen **OR** acetaminophen, nitroGLYCERIN   Vital Signs    Vitals:   12/17/16 1101 12/17/16 1308 12/17/16 2104 12/18/16 0557  BP: (!) 149/88 140/75 124/65 123/62  Pulse: 95 97 99 92  Resp:  18 18 18   Temp:  98 F (36.7 C) 98.5 F (36.9 C) 98 F (36.7 C)  TempSrc:  Oral Oral Oral  SpO2: 98% 100% 96% 96%  Weight:        Intake/Output Summary (Last 24 hours) at 12/18/16 0801 Last data filed at 12/18/16 0447  Gross per 24 hour  Intake              220 ml  Output              310 ml  Net              -90 ml   Filed Weights   12/17/16 0601  Weight: 198 lb (89.8 kg)    Physical Exam   GEN: Caucasian males appears younger than stated age, in no acute distress.  HEENT: Grossly normal.  Neck: Supple, no JVD, carotid bruits, or masses. Cardiac: RRR, no murmurs, rubs, or gallops. No clubbing, cyanosis, edema.  Radials/DP/PT 2+ and equal bilaterally.  Respiratory:  Respirations regular and unlabored, clear to auscultation bilaterally. GI: Soft, nontender, nondistended,  BS + x 4. MS: no deformity or atrophy. Skin: warm and dry, no rash. Neuro:  Strength and sensation are intact. Psych: AAOx3.  Normal affect.  Labs    CBC  Recent Labs  12/18/16 0403  WBC 23.1*  HGB 14.3  HCT 42.3  MCV 94.2  PLT [id number]   Basic Metabolic Panel  Recent Labs  12/18/16 0403  NA 134*  K 3.9  CL 100*  CO2 25  GLUCOSE 140*  BUN 13  CREATININE 0.98  CALCIUM 8.9   Liver Function Tests  Recent Labs  12/18/16 0403  AST 27  ALT 18  ALKPHOS 65  BILITOT 0.9  PROT 6.6  ALBUMIN 3.1*   No results for input(s): LIPASE, AMYLASE in the last 72 hours. Cardiac Enzymes  Recent Labs  12/17/16 0654 12/17/16 1309 12/17/16 1859  TROPONINI 0.03* 0.04* 0.10*   BNP Invalid input(s): POCBNP D-Dimer No results for input(s): DDIMER in the last 72 hours. Hemoglobin A1C No results for input(s): HGBA1C in the last 72 hours. Fasting Lipid Panel No results for input(s): CHOL, HDL, LDLCALC, TRIG, CHOLHDL, LDLDIRECT in the last 72 hours. Thyroid Function Tests  Recent Labs  12/17/16 [id number]  TSH 0.931    Telemetry    1st degree AVB with long PR and occ PVCs with rates in the 90's-100's. No pauses noted - Personally Reviewed  ECG    No new tracings  Radiology    CXR at [facility] 12/17/2016 There is atherosclerotic calcification in the aortic arch without focal airspace disease or pulmonary edema.  Ultra sound aorta at [facility] 12/17/2016 No abdominal aortic aneurysm.  KUB/Flatplate at [facility] 624THL Gallstone in upper right quadrant, normal gas pattern  Cardiac Studies   Echo - Left ventricle: The cavity size was normal. Wall thickness was   increased in a pattern of mild LVH. Systolic function was   vigorous. The estimated ejection fraction was in the range of 65%   to 70%. There is akinesis of the basalinferior myocardium. The   study is not technically sufficient to allow evaluation of LV   diastolic function. - Aortic valve: Right coronary  cusp mobility was moderately   restricted. - Mitral valve: Calcified annulus. - Left atrium: The atrium was mildly dilated.  Patient Profile     82 yo male patient with PMH of prostate cancer/BPH, dyslipidemia and reflux presented to [facility] for severe epigastric pain and was found to have RBBB and second degree AVB Mobitz 1. The patient has no cardiac history and no previous EKG for comparison. He was transferred to [facility] for cardiology evaluation.   Assessment & Plan    1. Second degree AV block Mobitz type 1 -Noted at [facility] (EKG in shadow chart) second degree AVB type 2, but upon further evaluation it appears that there are PR intervals lengthening with P burried with the T waves and is actually Type 1 -1st degree AVB here on EKG rhythm strip and telemetry -No offending medications, avoid AV nodal blocking agents -No previous cardiac history and no previous EKG for comparison -Continue to monitor on tele -No pacemaker indicated  2. Mildly elevated troponin - 0.03-->0.04-->0.1. Likely demand ischemia related to abdominal pain -Echo pending-would like to obtain prior to discharge if possible as pt has unknown cardiac status and no previous testing to evaluate cardiac function -Creatinine 0.81 at [facility] 12/28  3. Right bundle branch block -Unknown if new with no pervious EKG for comparison -QTC 520 on EKG, closer to 420 by personal measurement  -No history of syncope  4. Epigastric pain -?GI in nature vs cardiac related, pain is reproducible in mid upper abdomen -Pt still has gallbladder and single gallstone seen on xray -Aortic aneurysm ruled out by ultrasound -PPI has been initiated -No further epigastric pain or nausea since yesterday morning.   5. Elevated WBC -WBC 23.1K this am, at [facility] was 10.9K on 12/17/16 0140 -WBC was normal in 09/2016 -Currently afebrile and does not appear toxic -Could be infectious process which  led to epigastric pain and nausea/vomiting -Repeat CBC with dif for confirmation  6. Hypertension -Overnight BP 120's/60's -Not previously treated -continue to monitor  7. Hyperlipidemia -Continue lovastatin and niacin  Patient is stable for discharge from cardiology standpoint pending echo and evaluation of elevated WBC.   Signed, Daune Perch, NP  12/18/2016, 8:01 AM   Personally seen and examined. Agree with above.  WBC increased.  Trop mildly elevated - likely demand ischemia. Now CP free. Would not pursue invasive approach.  No evidence of worsening heart block requiring pacer.  EF reassuring 70% with only basal inferior akinesis.   Will sign off. Please call if any questions.  Will sign off. Please call if any questions.

## 2016-12-18 NOTE — Progress Notes (Signed)
  Echocardiogram 2D Echocardiogram has been performed.  Moriya Mitchell L Androw 12/18/2016, 10:30 AM

## 2016-12-19 LAB — COMPREHENSIVE METABOLIC PANEL
ALBUMIN: 2.8 g/dL — AB (ref 3.5–5.0)
ALT: 16 U/L — AB (ref 17–63)
AST: 28 U/L (ref 15–41)
Alkaline Phosphatase: 63 U/L (ref 38–126)
Anion gap: 9 (ref 5–15)
BUN: 12 mg/dL (ref 6–20)
CHLORIDE: 105 mmol/L (ref 101–111)
CO2: 23 mmol/L (ref 22–32)
CREATININE: 0.89 mg/dL (ref 0.61–1.24)
Calcium: 8.5 mg/dL — ABNORMAL LOW (ref 8.9–10.3)
GFR calc Af Amer: >60 mL/min (ref >60)
GLUCOSE: 141 mg/dL — AB (ref 65–99)
POTASSIUM: 4 mmol/L (ref 3.5–5.1)
Sodium: 137 mmol/L (ref 135–145)
Total Bilirubin: 1.1 mg/dL (ref 0.3–1.2)
Total Protein: 6.2 g/dL — ABNORMAL LOW (ref 6.5–8.1)

## 2016-12-19 LAB — CBC WITH DIFFERENTIAL/PLATELET
BASOS ABS: 0 10*3/uL (ref 0.0–0.1)
BASOS PCT: 0 %
EOS PCT: 0 %
Eosinophils Absolute: 0 10*3/uL (ref 0.0–0.7)
HCT: 41.6 % (ref 39.0–52.0)
Hemoglobin: 14.1 g/dL (ref 13.0–17.0)
LYMPHS PCT: 3 %
Lymphs Abs: 0.7 10*3/uL (ref 0.7–4.0)
MCH: 31.8 pg (ref 26.0–34.0)
MCHC: 33.9 g/dL (ref 30.0–36.0)
MCV: 93.7 fL (ref 78.0–100.0)
Monocytes Absolute: 2.1 10*3/uL — ABNORMAL HIGH (ref 0.1–1.0)
Monocytes Relative: 9 %
NEUTROS ABS: 21.1 10*3/uL — AB (ref 1.7–7.7)
Neutrophils Relative %: 88 %
PLATELETS: 188 10*3/uL (ref 150–400)
RBC: 4.44 MIL/uL (ref 4.22–5.81)
RDW: 13.2 % (ref 11.5–15.5)
WBC: 23.9 10*3/uL — AB (ref 4.0–10.5)

## 2016-12-19 LAB — LACTIC ACID, PLASMA
Lactic Acid, Venous: 1.1 mmol/L (ref 0.5–1.9)
Lactic Acid, Venous: 1.9 mmol/L (ref 0.5–1.9)

## 2016-12-19 LAB — PROTIME-INR
INR: 1.26
PROTHROMBIN TIME: 15.9 s — AB (ref 11.4–15.2)

## 2016-12-19 LAB — PROCALCITONIN: Procalcitonin: 0.82 ng/mL

## 2016-12-19 LAB — TROPONIN I: TROPONIN I: 0.15 ng/mL — AB (ref <0.03)

## 2016-12-19 LAB — APTT: APTT: 30 s (ref 24–36)

## 2016-12-19 MED ORDER — ALUM & MAG HYDROXIDE-SIMETH 200-200-20 MG/5ML PO SUSP
15.0000 mL | Freq: Four times a day (QID) | ORAL | Status: DC | PRN
  Administered 2016-12-19: 15 mL via ORAL
  Filled 2016-12-19: qty 30

## 2016-12-19 MED ORDER — PANTOPRAZOLE SODIUM 40 MG PO TBEC
40.0000 mg | DELAYED_RELEASE_TABLET | Freq: Every day | ORAL | Status: DC
  Administered 2016-12-19 – 2016-12-22 (×4): 40 mg via ORAL
  Filled 2016-12-19 (×4): qty 1

## 2016-12-19 NOTE — Evaluation (Signed)
Clinical/Bedside Swallow Evaluation Patient Details  Name: Alec Davis MRN: [id number] Date of Birth: [birth year]  Today's Date: 12/19/2016 Time: SLP Start Time (ACUTE ONLY): [id number] SLP Stop Time (ACUTE ONLY): 0958 SLP Time Calculation (min) (ACUTE ONLY): 10 min  Past Medical History:  Past Medical History:  Diagnosis Date  . BPH (benign prostatic hyperplasia)   . Cataract   . GERD (gastroesophageal reflux disease)   . Hyperlipidemia   . Hypertension   . PROSTATE CANCER 01/19/2011   Qualifier: Diagnosis of  By: Nils Pyle CMA ([place]), [name]    . Vitamin D deficiency    Past Surgical History:  Past Surgical History:  Procedure Laterality Date  . cataracts  Bilateral    Dr [name]  . SPINE SURGERY     HPI:  Alec Davis a 82 y.o.malewho was admitted to [facility] hospital due to epigastric pain, transferred to [facility] for evaluation by cardiology as they do not have Cardiology currently at [facility]. Patient with new onset Mobitz type I and RBBB EKG findings with other variations in his telemetry reading. Chest x-ray shows right upper lobe opacity. Pt did have some vomiting in ambulance.    Assessment / Plan / Recommendation Clinical Impression  Pt demonstrates adequate swallowing function. Initial sip with probable penetration/aspiration occurance, with hard coughing indicating good sensation. Subsequent trials of over 4 oz of water tolerated with no further signs of dysphagia or aspiration. Pt denies difficulty swallowing and family present agrees. Pt may continue regular diet and thin liquids. Briefly discussed basic precautions. No SLP f/u needed.     Aspiration Risk  Mild aspiration risk    Diet Recommendation Regular;Thin liquid   Liquid Administration via: Cup;Straw Medication Administration: Whole meds with liquid Supervision: Patient able to self feed Compensations: Slow rate;Small sips/bites Postural Changes: Seated upright at 90 degrees    Other  Recommendations Oral  Care Recommendations: Patient independent with oral care   Follow up Recommendations None      Frequency and Duration            Prognosis        Swallow Study   General HPI: Alec A Davis a 82 y.o.malewho was admitted to [facility] hospital due to epigastric pain, transferred to [facility] for evaluation by cardiology as they do not have Cardiology currently at [facility]. Patient with new onset Mobitz type I and RBBB EKG findings with other variations in his telemetry reading. Chest x-ray shows right upper lobe opacity. Pt did have some vomiting in ambulance.  Type of Study: Bedside Swallow Evaluation Diet Prior to this Study: Regular;Thin liquids Temperature Spikes Noted: No Respiratory Status: Room air History of Recent Intubation: No Behavior/Cognition: Alert;Cooperative;Pleasant mood Oral Cavity Assessment: Within Functional Limits Oral Care Completed by SLP: No Oral Cavity - Dentition: Adequate natural dentition;Missing dentition Vision: Functional for self-feeding Self-Feeding Abilities: Able to feed self Patient Positioning: Upright in chair Baseline Vocal Quality: Normal Volitional Cough: Strong Volitional Swallow: Able to elicit    Oral/Motor/Sensory Function     Ice Chips     Thin Liquid Thin Liquid: Impaired Presentation: Cup;Straw Pharyngeal  Phase Impairments: Cough - Immediate    Nectar Thick Nectar Thick Liquid: Not tested   Honey Thick Honey Thick Liquid: Not tested   Puree     Solid   GO   Solid: Within functional limits       Alec Baltimore, MA CCC-SLP [id number]  Alec Davis, Alec Davis 12/19/2016,10:00 AM

## 2016-12-19 NOTE — Progress Notes (Signed)
[facility] Progress Note  Patient: Alec Davis [id number]   PCP: Redge Gainer, MD DOB: 1923/12/10   DOA: 12/17/2016   DOS: 12/19/2016   Date of Service: the patient was seen and examined on 12/19/2016  Brief hospital course: Pt. with PMH of Day, GERD nausea and vomiting; admitted on 12/17/2016, with complaint of , nausea and vomiting, was found to have second-degree heart block as well as sepsis from pneumonia. Currently further plan is continue further workup.  Assessment and Plan: 1. Second degree AV block, Mobitz type I Cardiology consulted. No further inpatient workup or treatment at present. Currently resolved Echocardiogram currently pending.  2. Community-acquired pneumonia. Significant leukocytosis. A sepsis workup initiated. Currently on Unasyn. We'll continue. Speech therapy recommends regular diet.  3. Dementia. May have early onset dementia. We'll continue monitoring.  4. RBBB. Continue monitoring on telemetry.  5. GERD. Continue PPI.  Bowel regimen: last BM prior to admission Diet: Regular consistency cardiac diet DVT Prophylaxis: subcutaneous Heparin  Advance goals of care discussion: Full code  Family Communication: family was present at bedside, at the time of interview. The pt provided permission to discuss medical plan with the family. Opportunity was given to ask question and all questions were answered satisfactorily.   Disposition:  Discharge to home. Expected discharge date: 12/21/2016 pending negative cultures  Consultants: Cardiology Procedures: Echocardiogram pending  Antibiotics: Anti-infectives    Start     Dose/Rate Route Frequency Ordered Stop   12/18/16 2000  ampicillin-sulbactam (UNASYN) 1.5 g in sodium chloride 0.9 % 50 mL IVPB     1.5 g 100 mL/hr over 30 Minutes Intravenous Every 8 hours 12/18/16 1825          Subjective: Denies any acute complaints, feeling better.  Objective: Physical Exam: Vitals:   12/18/16 1331 12/18/16 1943 12/19/16 0555 12/19/16 1339  BP: 132/72 137/69 125/85 133/72  Pulse: 95 98 90 90  Resp: 19 (!) 24 (!) 26 17  Temp: 98.6 F (37 C) 98.1 F (36.7 C) 97.7 F (36.5 C) 97.8 F (36.6 C)  TempSrc: Oral Oral Oral Oral  SpO2: 100% 98% 96% 97%  Weight:        Intake/Output Summary (Last 24 hours) at 12/19/16 1712 Last data filed at 12/19/16 1300  Gross per 24 hour  Intake             1775 ml  Output              670 ml  Net             1105 ml   Filed Weights   12/17/16 0601  Weight: 89.8 kg (198 lb)    General: Alert, Awake and Oriented to Time, Place and Person. Appear in mild distress, affect appropriate Eyes: PERRL, Conjunctiva normal ENT: Oral Mucosa clear moist. Neck: no JVD, no Abnormal Mass Or lumps Cardiovascular: S1 and S2 Present, no Murmur, Respiratory: Bilateral Air entry equal and Decreased, no use of accessory muscle, Clear to Auscultation, no Crackles, no wheezes Abdomen: Bowel Sound present, Soft and no tenderness Skin: no redness, no Rash, no induration Extremities: no Pedal edema, no calf tenderness Neurologic: Grossly no focal neuro deficit. Bilaterally Equal motor strength  Data Reviewed: CBC:  Recent Labs Lab 12/18/16 0403 12/18/16 0904 12/19/16 0808  WBC 23.1* 23.6* 23.9*  NEUTROABS  --  20.9* 21.1*  HGB 14.3 15.2 14.1  HCT 42.3 44.3 41.6  MCV 94.2 94.3 93.7  PLT 220 215 0000000   Basic Metabolic Panel:  Recent Labs Lab 12/18/16 0403 12/19/16 0808  NA 134* 137  K 3.9 4.0  CL 100* 105  CO2 25 23  GLUCOSE 140* 141*  BUN 13 12  CREATININE 0.98 0.89  CALCIUM 8.9 8.5*    Liver Function Tests:  Recent Labs Lab 12/18/16 0403 12/19/16 0808  AST 27 28  ALT 18 16*  ALKPHOS 65 63  BILITOT 0.9 1.1  PROT 6.6 6.2*  ALBUMIN 3.1* 2.8*   No results for input(s): LIPASE, AMYLASE in the last 168 hours. No results for input(s): AMMONIA in the last 168 hours. Coagulation Profile:  Recent Labs Lab 12/19/16 0808    INR 1.26   Cardiac Enzymes:  Recent Labs Lab 12/17/16 0654 12/17/16 1309 12/17/16 1859 12/19/16 0808  TROPONINI 0.03* 0.04* 0.10* 0.15*   BNP (last 3 results) No results for input(s): PROBNP in the last 8760 hours.  CBG: No results for input(s): GLUCAP in the last 168 hours.  Studies: No results found.   Scheduled Meds: . ampicillin-sulbactam (UNASYN) IV  1.5 g Intravenous Q8H  . aspirin EC  81 mg Oral PC supper  . enoxaparin (LOVENOX) injection  40 mg Subcutaneous Daily  . sodium chloride flush  3 mL Intravenous Q12H  . tamsulosin  0.4 mg Oral Daily   Continuous Infusions: . sodium chloride 75 mL/hr at 12/18/16 2025   PRN Meds: acetaminophen **OR** acetaminophen, nitroGLYCERIN  Time spent: 30 minutes  Author: Berle Mull, MD [facility] Pager: [phone] 12/19/2016 5:12 PM  If 7PM-7AM, please contact night-coverage at [email or web address], password [facility]

## 2016-12-20 DIAGNOSIS — I459 Conduction disorder, unspecified: Secondary | ICD-10-CM

## 2016-12-20 DIAGNOSIS — I10 Essential (primary) hypertension: Secondary | ICD-10-CM

## 2016-12-20 DIAGNOSIS — C61 Malignant neoplasm of prostate: Secondary | ICD-10-CM

## 2016-12-20 LAB — CBC WITH DIFFERENTIAL/PLATELET
BASOS ABS: 0 10*3/uL (ref 0.0–0.1)
BASOS PCT: 0 %
Eosinophils Absolute: 0 10*3/uL (ref 0.0–0.7)
Eosinophils Relative: 0 %
HEMATOCRIT: 38.9 % — AB (ref 39.0–52.0)
HEMOGLOBIN: 13.3 g/dL (ref 13.0–17.0)
LYMPHS PCT: 3 %
Lymphs Abs: 0.7 10*3/uL (ref 0.7–4.0)
MCH: 31.9 pg (ref 26.0–34.0)
MCHC: 34.2 g/dL (ref 30.0–36.0)
MCV: 93.3 fL (ref 78.0–100.0)
Monocytes Absolute: 2.1 10*3/uL — ABNORMAL HIGH (ref 0.1–1.0)
Monocytes Relative: 10 %
NEUTROS ABS: 18.5 10*3/uL — AB (ref 1.7–7.7)
NEUTROS PCT: 87 %
Platelets: 193 10*3/uL (ref 150–400)
RBC: 4.17 MIL/uL — AB (ref 4.22–5.81)
RDW: 13 % (ref 11.5–15.5)
WBC: 21.4 10*3/uL — AB (ref 4.0–10.5)

## 2016-12-20 LAB — BASIC METABOLIC PANEL
ANION GAP: 7 (ref 5–15)
BUN: 11 mg/dL (ref 6–20)
CALCIUM: 8 mg/dL — AB (ref 8.9–10.3)
CO2: 24 mmol/L (ref 22–32)
Chloride: 106 mmol/L (ref 101–111)
Creatinine, Ser: 0.83 mg/dL (ref 0.61–1.24)
GLUCOSE: 128 mg/dL — AB (ref 65–99)
POTASSIUM: 3.1 mmol/L — AB (ref 3.5–5.1)
Sodium: 137 mmol/L (ref 135–145)

## 2016-12-20 MED ORDER — WHITE PETROLATUM GEL
Status: DC | PRN
  Administered 2016-12-20: 17:00:00 via TOPICAL
  Filled 2016-12-20: qty 1

## 2016-12-20 MED ORDER — POTASSIUM CHLORIDE CRYS ER 20 MEQ PO TBCR
30.0000 meq | EXTENDED_RELEASE_TABLET | Freq: Two times a day (BID) | ORAL | Status: DC
  Administered 2016-12-20 – 2016-12-22 (×5): 30 meq via ORAL
  Filled 2016-12-20 (×5): qty 1

## 2016-12-20 MED ORDER — POLYETHYLENE GLYCOL 3350 17 G PO PACK
17.0000 g | PACK | Freq: Every day | ORAL | Status: DC
  Administered 2016-12-20 – 2016-12-22 (×3): 17 g via ORAL
  Filled 2016-12-20 (×3): qty 1

## 2016-12-20 MED ORDER — SENNA 8.6 MG PO TABS
1.0000 | ORAL_TABLET | Freq: Every day | ORAL | Status: DC
  Administered 2016-12-20 – 2016-12-22 (×3): 8.6 mg via ORAL
  Filled 2016-12-20 (×3): qty 1

## 2016-12-20 NOTE — Progress Notes (Signed)
PROGRESS NOTE    Alec Davis  [id number] DOB: 1923-10-12 DOA: 12/17/2016 PCP: Redge Gainer, MD    Brief Narrative:  Pt. with PMH of Day, GERD nausea and vomiting; admitted on 12/17/2016, with complaint of , nausea and vomiting, was found to have second-degree heart block as well as sepsis from pneumonia. Currently further plan is continue further workup.   Assessment & Plan:   Principal Problem:   Second degree AV block, Mobitz type I Active Problems:   Prostate cancer ([place])   Essential hypertension   Right bundle branch block   GERD (gastroesophageal reflux disease)   Hyperlipemia   Depression   Abdominal pain, epigastric   Mobitz type 1 second degree atrioventricular block   Heart block   Second degree AV block, Mobitz type I Cardiology consulted. No further inpatient workup or treatment at present Echocardiogram performed yesterday and shows Left ventricle: The cavity size was normal. Wall thickness was increased in a pattern of mild LVH. Systolic function was vigorous. The estimated ejection fraction was in the range of 65% to 70%. There is akinesis of the basalinferior myocardium. The study is not technically sufficient to allow evaluation of LV diastolic function. Aortic valve: Right coronary cusp mobility was moderately restricted. Mitral valve: Calcified annulus. Left atrium: The atrium was mildly dilated.  Community-acquired pneumonia. Significant leukocytosis. A sepsis workup initiated. Currently on Unasyn. WBC slightly decreased this am Speech therapy recommends regular diet Blood cultures pending  Dementia. May have early onset dementia.  RBBB. Continue monitoring on telemetry Still not reports of syncope  GERD Continue PPI.  Bowel regimen: last BM prior to admission Diet: Regular consistency cardiac diet DVT Prophylaxis: subcutaneous Heparin Code Status: Full Code Family Communication: No family bedside  Disposition Plan: will  likely discharge back home to previous environment possibly tomorrow    Consultants:   Cardiology  Procedures:  Echocardiogram 12/30: Left ventricle: The cavity size was normal. Wall thickness was increased in a pattern of mild LVH. Systolic function was vigorous. The estimated ejection fraction was in the range of 65% to 70%. There is akinesis of the basalinferior myocardium. The study is not technically sufficient to allow evaluation of LV diastolic function. Aortic valve: Right coronary cusp mobility was moderately restricted. Mitral valve: Calcified annulus. Left atrium: The atrium was mildly dilated.  Antimicrobials:   Unasyn 12/29>    Subjective: Patient seen and evaluated.  He is laying in bed asleep in no distress.  Voices no questions but states upon waking he is very tired.  He says his daughter is flying in today to see him (she lives in [place]). Will encourage ambulation to chair today.   Objective: Vitals:   12/19/16 0555 12/19/16 1339 12/19/16 2021 12/20/16 0509  BP: 125/85 133/72 (!) 161/87 131/68  Pulse: 90 90 (!) 103 84  Resp: (!) 26 17 18 20   Temp: 97.7 F (36.5 C) 97.8 F (36.6 C) 98.7 F (37.1 C) 97.3 F (36.3 C)  TempSrc: Oral Oral Oral Oral  SpO2: 96% 97% 95% 97%  Weight:        Intake/Output Summary (Last 24 hours) at 12/20/16 0819 Last data filed at 12/20/16 0000  Gross per 24 hour  Intake              340 ml  Output              950 ml  Net             -610 ml  Filed Weights   12/17/16 0601  Weight: 89.8 kg (198 lb)    Examination:  General exam: Appears calm and comfortable  Respiratory system: Clear to auscultation. Respiratory effort normal. Cardiovascular system: S1 & S2 heard, RRR. No JVD, murmurs, rubs, gallops or clicks. No pedal edema. Gastrointestinal system: Abdomen is nondistended, soft and nontender. No organomegaly or masses felt. Normal bowel sounds heard. Central nervous system: Alert and oriented. No focal neurological  deficits. Extremities: Symmetric 5 x 5 power. Skin: No rashes, lesions or ulcers Psychiatry: Judgement and insight appear normal. Mood & affect appropriate.     Data Reviewed: I have personally reviewed following labs and imaging studies  CBC:  Recent Labs Lab 12/18/16 0403 12/18/16 0904 12/19/16 0808 12/20/16 0249  WBC 23.1* 23.6* 23.9* 21.4*  NEUTROABS  --  20.9* 21.1* 18.5*  HGB 14.3 15.2 14.1 13.3  HCT 42.3 44.3 41.6 38.9*  MCV 94.2 94.3 93.7 93.3  PLT 220 215 188 0000000   Basic Metabolic Panel:  Recent Labs Lab 12/18/16 0403 12/19/16 0808 12/20/16 0249  NA 134* 137 137  K 3.9 4.0 3.1*  CL 100* 105 106  CO2 25 23 24   GLUCOSE 140* 141* 128*  BUN 13 12 11   CREATININE 0.98 0.89 0.83  CALCIUM 8.9 8.5* 8.0*   GFR: Estimated Creatinine Clearance: 61.7 mL/min (by C-G formula based on SCr of 0.83 mg/dL). Liver Function Tests:  Recent Labs Lab 12/18/16 0403 12/19/16 0808  AST 27 28  ALT 18 16*  ALKPHOS 65 63  BILITOT 0.9 1.1  PROT 6.6 6.2*  ALBUMIN 3.1* 2.8*   No results for input(s): LIPASE, AMYLASE in the last 168 hours. No results for input(s): AMMONIA in the last 168 hours. Coagulation Profile:  Recent Labs Lab 12/19/16 0808  INR 1.26   Cardiac Enzymes:  Recent Labs Lab 12/17/16 0654 12/17/16 1309 12/17/16 1859 12/19/16 0808  TROPONINI 0.03* 0.04* 0.10* 0.15*   BNP (last 3 results) No results for input(s): PROBNP in the last 8760 hours. HbA1C: No results for input(s): HGBA1C in the last 72 hours. CBG: No results for input(s): GLUCAP in the last 168 hours. Lipid Profile: No results for input(s): CHOL, HDL, LDLCALC, TRIG, CHOLHDL, LDLDIRECT in the last 72 hours. Thyroid Function Tests:  Recent Labs  12/17/16 0837  TSH 0.931   Anemia Panel: No results for input(s): VITAMINB12, FOLATE, FERRITIN, TIBC, IRON, RETICCTPCT in the last 72 hours. Sepsis Labs:  Recent Labs Lab 12/19/16 0808 12/19/16 0809 12/19/16 1052  PROCALCITON 0.82   --   --   LATICACIDVEN  --  1.1 1.9    No results found for this or any previous visit (from the past 240 hour(s)).       Radiology Studies: Dg Chest 1 View  Result Date: 12/18/2016 CLINICAL DATA:  Pneumonia EXAM: CHEST 1 VIEW COMPARISON:  Chest radiograph from one day prior. FINDINGS: Stable cardiomediastinal silhouette with normal heart size and aortic atherosclerosis. No pneumothorax. No pleural effusion. Slightly low lung volumes. Stable subcentimeter granuloma in the peripheral upper right lung. Mild hazy opacity in the medial upper right lung appears slightly increased. Otherwise no consolidative airspace disease. IMPRESSION: 1. Mild hazy opacity in the medial upper right lung, slightly increased, potentially a developing pneumonia. Recommend follow-up PA and lateral post treatment chest radiographs in 4-6 weeks. 2. Aortic atherosclerosis. Electronically Signed   By: Ilona Sorrel M.D.   On: 12/18/2016 11:40        Scheduled Meds: . ampicillin-sulbactam (UNASYN) IV  1.5  g Intravenous Q8H  . aspirin EC  81 mg Oral PC supper  . enoxaparin (LOVENOX) injection  40 mg Subcutaneous Daily  . pantoprazole  40 mg Oral Daily  . potassium chloride  30 mEq Oral BID  . sodium chloride flush  3 mL Intravenous Q12H  . tamsulosin  0.4 mg Oral Daily   Continuous Infusions: . sodium chloride 75 mL/hr at 12/18/16 2025     LOS: 3 days    Time spent: 35 minutes    Loretha Stapler, MD [facility] Pager [phone]  If 7PM-7AM, please contact night-coverage [email or web address] Password [email or web address] 12/20/2016, 8:19 AM

## 2016-12-20 NOTE — Progress Notes (Signed)
Patient lying in bed, no needs at this time. Call light within reach, bed alarm on.

## 2016-12-21 LAB — CBC
HCT: 38.2 % — ABNORMAL LOW (ref 39.0–52.0)
HEMOGLOBIN: 12.5 g/dL — AB (ref 13.0–17.0)
MCH: 31.4 pg (ref 26.0–34.0)
MCHC: 32.7 g/dL (ref 30.0–36.0)
MCV: 96 fL (ref 78.0–100.0)
PLATELETS: 182 10*3/uL (ref 150–400)
RBC: 3.98 MIL/uL — ABNORMAL LOW (ref 4.22–5.81)
RDW: 13.3 % (ref 11.5–15.5)
WBC: 15.7 10*3/uL — ABNORMAL HIGH (ref 4.0–10.5)

## 2016-12-21 LAB — BASIC METABOLIC PANEL
Anion gap: 8 (ref 5–15)
BUN: 12 mg/dL (ref 6–20)
CALCIUM: 7.9 mg/dL — AB (ref 8.9–10.3)
CO2: 24 mmol/L (ref 22–32)
CREATININE: 0.79 mg/dL (ref 0.61–1.24)
Chloride: 106 mmol/L (ref 101–111)
Glucose, Bld: 100 mg/dL — ABNORMAL HIGH (ref 65–99)
Potassium: 3.9 mmol/L (ref 3.5–5.1)
SODIUM: 138 mmol/L (ref 135–145)

## 2016-12-21 MED ORDER — AMOXICILLIN-POT CLAVULANATE 875-125 MG PO TABS
1.0000 | ORAL_TABLET | Freq: Two times a day (BID) | ORAL | Status: DC
  Administered 2016-12-22 (×2): 1 via ORAL
  Filled 2016-12-21 (×2): qty 1

## 2016-12-21 NOTE — Progress Notes (Addendum)
PROGRESS NOTE    Alec Davis  [id number] DOB: [birth year] DOA: 12/17/2016 PCP: Redge Gainer, MD    Brief Narrative:  Pt. with PMH of Day, GERD nausea and vomiting; admitted on 12/17/2016, with complaint of , nausea and vomiting, was found to have second-degree heart block as well as sepsis from pneumonia. Cardiology saw and evaluated patient and found first degree heart block and possibly a new RBBB.  Patient has not had any episodes of syncope.  He was treated for his infection with Unasyn which was transitioned to oral antibiotics.  He was seen by PT and OT.   Assessment & Plan:   Principal Problem:   Second degree AV block, Mobitz type I Active Problems:   Prostate cancer ([place])   Essential hypertension   Right bundle branch block   GERD (gastroesophageal reflux disease)   Hyperlipemia   Depression   Abdominal pain, epigastric   Mobitz type 1 second degree atrioventricular block   Heart block   Second degree AV block, Mobitz type I Cardiology consulted. No further inpatient workup or treatment at present Echocardiogram performed yesterday and shows Left ventricle: The cavity size was normal. Wall thickness was increased in a pattern of mild LVH. Systolic function was vigorous. The estimated ejection fraction was in the range of 65% to 70%. There is akinesis of the basalinferior myocardium. The study is not technically sufficient to allow evaluation of LV diastolic function. Aortic valve: Right coronary cusp mobility was moderately restricted. Mitral valve: Calcified annulus. Left atrium: The atrium was mildly dilated. Appears more like Type I AV Block  Community-acquired pneumonia. Significant leukocytosis that is downtrending with Unasyn A sepsis workup initiated CXR on 12/29 concerning for developing pneumonia and repeat CXR suggested for 4-6 weeks from this time Speech therapy recommends regular diet Blood cultures show NG x 1 day Will attempt to transition to  PO antibiotics today (transition to Amoxicillin 875mg  BID) PT and OT eval ordered as patient has been in bed now for almost 4 days  Dementia. May have early onset dementia.  RBBB. Continue monitoring on telemetry Still not reports of syncope  GERD Continue PPI.  Bowel regimen: last BM today Diet: Regular consistency cardiac diet DVT Prophylaxis: subcutaneous Heparin Code Status: Full Code Family Communication: Wife and daughter bedside  Disposition Plan: will likely discharge back home to previous environment after transition to PO antibiotics and likely tomorrow am   Consultants:   Cardiology  Procedures:  Echocardiogram 12/30: Left ventricle: The cavity size was normal. Wall thickness was increased in a pattern of mild LVH. Systolic function was vigorous. The estimated ejection fraction was in the range of 65% to 70%. There is akinesis of the basalinferior myocardium. The study is not technically sufficient to allow evaluation of LV diastolic function. Aortic valve: Right coronary cusp mobility was moderately restricted. Mitral valve: Calcified annulus. Left atrium: The atrium was mildly dilated.  Antimicrobials:   Unasyn 12/29> 12/21/16  Augmentin 12/21/16>   Subjective: Patient states he is feeling well today.  He is asking about breakfast which hasn't arrived yet.  Laying in bed comfortably.  Slept reasonably well last night.  No acute overnight events noted.  Just had a bowel movement this afternoon.  Objective: Vitals:   12/20/16 0509 12/20/16 1331 12/20/16 1957 12/21/16 0612  BP: 131/68 127/68 138/65 134/65  Pulse: 84 87 80 80  Resp: 20 17 17 17   Temp: 97.3 F (36.3 C) 97.4 F (36.3 C) 98.7 F (37.1 C) 98.1 F (36.7  C)  TempSrc: Oral Oral Oral Oral  SpO2: 97% 98% 96% 96%  Weight:        Intake/Output Summary (Last 24 hours) at 12/21/16 0835 Last data filed at 12/21/16 0612  Gross per 24 hour  Intake              480 ml  Output              720 ml    Net             -240 ml   Filed Weights   12/17/16 0601  Weight: 89.8 kg (198 lb)    Examination:  General exam: Appears calm and comfortable  Respiratory system: Clear to auscultation. Respiratory effort normal. Cardiovascular system: S1 & S2 heard, RRR. No JVD, murmurs, rubs, gallops or clicks. No pedal edema. Gastrointestinal system: Abdomen is nondistended, soft and nontender. No organomegaly or masses felt. Normal bowel sounds heard. Central nervous system: Alert and oriented. No focal neurological deficits. Extremities: Symmetric 5 x 5 power. Skin: No rashes, lesions or ulcers Psychiatry: Judgement and insight appear normal. Mood & affect appropriate.     Data Reviewed: I have personally reviewed following labs and imaging studies  CBC:  Recent Labs Lab 12/18/16 0403 12/18/16 0904 12/19/16 0808 12/20/16 0249 12/21/16 0401  WBC 23.1* 23.6* 23.9* 21.4* 15.7*  NEUTROABS  --  20.9* 21.1* 18.5*  --   HGB 14.3 15.2 14.1 13.3 12.5*  HCT 42.3 44.3 41.6 38.9* 38.2*  MCV 94.2 94.3 93.7 93.3 96.0  PLT 220 215 188 193 [id number]   Basic Metabolic Panel:  Recent Labs Lab 12/18/16 0403 12/19/16 0808 12/20/16 0249 12/21/16 0401  NA 134* 137 137 138  K 3.9 4.0 3.1* 3.9  CL 100* 105 106 106  CO2 25 23 24 24   GLUCOSE 140* 141* 128* 100*  BUN 13 12 11 12   CREATININE 0.98 0.89 0.83 0.79  CALCIUM 8.9 8.5* 8.0* 7.9*   GFR: Estimated Creatinine Clearance: 64.1 mL/min (by C-G formula based on SCr of 0.79 mg/dL). Liver Function Tests:  Recent Labs Lab 12/18/16 0403 12/19/16 0808  AST 27 28  ALT 18 16*  ALKPHOS 65 63  BILITOT 0.9 1.1  PROT 6.6 6.2*  ALBUMIN 3.1* 2.8*   No results for input(s): LIPASE, AMYLASE in the last 168 hours. No results for input(s): AMMONIA in the last 168 hours. Coagulation Profile:  Recent Labs Lab 12/19/16 0808  INR 1.26   Cardiac Enzymes:  Recent Labs Lab 12/17/16 0654 12/17/16 1309 12/17/16 1859 12/19/16 0808  TROPONINI 0.03*  0.04* 0.10* 0.15*   BNP (last 3 results) No results for input(s): PROBNP in the last 8760 hours. HbA1C: No results for input(s): HGBA1C in the last 72 hours. CBG: No results for input(s): GLUCAP in the last 168 hours. Lipid Profile: No results for input(s): CHOL, HDL, LDLCALC, TRIG, CHOLHDL, LDLDIRECT in the last 72 hours. Thyroid Function Tests: No results for input(s): TSH, T4TOTAL, FREET4, T3FREE, THYROIDAB in the last 72 hours. Anemia Panel: No results for input(s): VITAMINB12, FOLATE, FERRITIN, TIBC, IRON, RETICCTPCT in the last 72 hours. Sepsis Labs:  Recent Labs Lab 12/19/16 [id number] 12/19/16 0809 12/19/16 1052  PROCALCITON 0.82  --   --   LATICACIDVEN  --  1.1 1.9    Recent Results (from the past 240 hour(s))  Culture, blood (x 2)     Status: None (Preliminary result)   Collection Time: 12/19/16  8:09 AM  Result Value Ref Range Status  Specimen Description BLOOD LEFT ANTECUBITAL  Final   Special Requests IN PEDIATRIC BOTTLE 2CC  Final   Culture NO GROWTH 1 DAY  Final   Report Status PENDING  Incomplete  Culture, blood (x 2)     Status: None (Preliminary result)   Collection Time: 12/19/16  8:11 AM  Result Value Ref Range Status   Specimen Description BLOOD LEFT HAND  Final   Special Requests BOTTLES DRAWN AEROBIC ONLY 5CC  Final   Culture NO GROWTH 1 DAY  Final   Report Status PENDING  Incomplete         Radiology Studies: No results found.      Scheduled Meds: . ampicillin-sulbactam (UNASYN) IV  1.5 g Intravenous Q8H  . aspirin EC  81 mg Oral PC supper  . enoxaparin (LOVENOX) injection  40 mg Subcutaneous Daily  . pantoprazole  40 mg Oral Daily  . polyethylene glycol  17 g Oral Daily  . potassium chloride  30 mEq Oral BID  . senna  1 tablet Oral Daily  . sodium chloride flush  3 mL Intravenous Q12H  . tamsulosin  0.4 mg Oral Daily   Continuous Infusions: . sodium chloride 75 mL/hr at 12/18/16 2025     LOS: 4 days    Time spent: 30  minutes    Loretha Stapler, MD [facility] Pager [phone]  If 7PM-7AM, please contact night-coverage [email or web address] Password [email or web address] 12/21/2016, 8:35 AM

## 2016-12-21 NOTE — Evaluation (Signed)
Physical Therapy Evaluation Patient Details Name: Alec Davis MRN: [id number] DOB: [birth year] Today's Date: 12/21/2016   History of Present Illness  83 yo admitted with AV block 2nd degrees, sepsis from PNA. PMHx: Prostate CA, HTN, Rt BBB, depression  Clinical Impression  Pt very pleasant and willing to mobilize today but limited by fatigue. Pt reports at baseline he can walk 300' before needing to rest and cares for himself, lives with wife. Pt with decreased ability with transfers, gait and activity tolerance who will benefit from acute therapy to maximize mobility and endurance to decrease burden of care. Family educated for furniture modifications at home to make transitions easier into standing. Recommend daily ambulation with staff acutely.   HR 84-101    Follow Up Recommendations Home health PT    Equipment Recommendations  Rolling walker with 5" wheels    Recommendations for Other Services OT consult     Precautions / Restrictions Precautions Precautions: Fall      Mobility  Bed Mobility               General bed mobility comments: pt in bathroom on arrival  Transfers Overall transfer level: Needs assistance   Transfers: Sit to/from Stand Sit to Stand: Min guard         General transfer comment: cues for hand placement and sequence with use of rail to stand from toilet, cues to back fully with sitting  Ambulation/Gait Ambulation/Gait assistance: Min guard Ambulation Distance (Feet): 75 Feet Assistive device: Rolling walker (2 wheeled);None Gait Pattern/deviations: Step-through pattern;Decreased stride length;Trunk flexed   Gait velocity interpretation: Below normal speed for age/gender General Gait Details: pt initiated gait without RW but reported fatigue and decreased stability, utilized RW for last 30' with increased endurance and balance. Cues for posture and RW use  Stairs            Wheelchair Mobility    Modified Rankin (Stroke  Patients Only)       Balance Overall balance assessment: Needs assistance   Sitting balance-Leahy Scale: Good       Standing balance-Leahy Scale: Good                               Pertinent Vitals/Pain Pain Assessment: No/denies pain    Home Living Family/patient expects to be discharged to:: Private residence Living Arrangements: Spouse/significant other;Children Available Help at Discharge: Available 24 hours/day Type of Home: House Home Access: Stairs to enter   Technical brewer of Steps: 3 Home Layout: One level Home Equipment: Shower seat;Grab bars - tub/shower      Prior Function Level of Independence: Independent         Comments: pt normally enters home from flight in basement, drives, assists with housework     Hand Dominance        Extremity/Trunk Assessment   Upper Extremity Assessment Upper Extremity Assessment: Overall WFL for tasks assessed    Lower Extremity Assessment Lower Extremity Assessment: Overall WFL for tasks assessed    Cervical / Trunk Assessment Cervical / Trunk Assessment: Kyphotic  Communication   Communication: No difficulties  Cognition Arousal/Alertness: Awake/alert Behavior During Therapy: WFL for tasks assessed/performed Overall Cognitive Status: Within Functional Limits for tasks assessed                      General Comments      Exercises     Assessment/Plan    PT Assessment  Patient needs continued PT services  PT Problem List Decreased mobility;Decreased activity tolerance;Decreased knowledge of use of DME;Decreased balance          PT Treatment Interventions Gait training;Stair training;Balance training;Functional mobility training;Therapeutic exercise;DME instruction;Therapeutic activities;Patient/family education    PT Goals (Current goals can be found in the Care Plan section)  Acute Rehab PT Goals Patient Stated Goal: return home, eat PT Goal Formulation: With  patient/family Time For Goal Achievement: 01/04/17 Potential to Achieve Goals: Good    Frequency Min 3X/week   Barriers to discharge Decreased caregiver support daughter able to stay in town until the 9th    Co-evaluation               End of Session Equipment Utilized During Treatment: Gait belt Activity Tolerance: Patient tolerated treatment well Patient left: in chair;with call bell/phone within reach;with family/visitor present Nurse Communication: Mobility status         Time: [id number] PT Time Calculation (min) (ACUTE ONLY): 14 min   Charges:   PT Evaluation $PT Eval Moderate Complexity: 1 Procedure     PT G Codes:        [name] Jan 16, 2017, 12:31 PM [name], [name]

## 2016-12-21 NOTE — Progress Notes (Signed)
This RN attempted to ambulate patient but refused,patient resting quietly in bed , call light within reach, will continue to monitor

## 2016-12-22 DIAGNOSIS — K219 Gastro-esophageal reflux disease without esophagitis: Secondary | ICD-10-CM

## 2016-12-22 MED ORDER — ENSURE ENLIVE PO LIQD: 237.0000 mL | Freq: Two times a day (BID) | ORAL | Status: DC

## 2016-12-22 MED ORDER — POTASSIUM CHLORIDE CRYS ER 15 MEQ PO TBCR: 30.0000 meq | EXTENDED_RELEASE_TABLET | Freq: Every day | ORAL | 0 refills | Status: DC

## 2016-12-22 MED ORDER — AMOXICILLIN-POT CLAVULANATE 875-125 MG PO TABS: 1.0000 | ORAL_TABLET | Freq: Two times a day (BID) | ORAL | 0 refills | Status: AC

## 2016-12-22 NOTE — Progress Notes (Signed)
Patient in a stable condition, discharge educations completed with patient, spouse and daughter at bedside, they verbalised understanding, tele dc ccmd notified, patient belongings at bedside, patient taken off the unit on a wheelchair by a hospital volunteer

## 2016-12-22 NOTE — Care Management Important Message (Signed)
Important Message  Patient Details  Name: Alec Davis MRN: [id number] Date of Birth: 01-30-1923   Medicare Important Message Given:  Yes    Nathen May 12/22/2016, 2:35 PM

## 2016-12-22 NOTE — Progress Notes (Signed)
Physical Therapy Treatment Patient Details Name: Alec Davis MRN: [id number] DOB: 1923/04/08 Today's Date: 01-13-2017    History of Present Illness 83 yo admitted with AV block 2nd degrees, sepsis from PNA. PMHx: Prostate CA, HTN, Rt BBB, depression    PT Comments    Pt remains pleasant and eager to return home. Pt with improved ability with transfers and gait and able to complete stairs today. Pt continues to benefit from RW and will need DME prior to DC. Will continue to follow  HR 81-96, no SOB or CP throughout  Follow Up Recommendations  Home health PT     Equipment Recommendations  Rolling walker with 5" wheels    Recommendations for Other Services       Precautions / Restrictions Precautions Precautions: Fall Restrictions Weight Bearing Restrictions: No    Mobility  Bed Mobility Overal bed mobility: Modified Independent             General bed mobility comments: with use of bed rail  Transfers Overall transfer level: Needs assistance   Transfers: Sit to/from Stand;Stand Pivot Transfers Sit to Stand: Supervision Stand pivot transfers: Min guard       General transfer comment: cues for hand placement and controlledc descent x 2 trials  Ambulation/Gait Ambulation/Gait assistance: Min guard Ambulation Distance (Feet): 175 Feet Assistive device: Rolling walker (2 wheeled) Gait Pattern/deviations: Step-through pattern;Decreased stride length;Trunk flexed   Gait velocity interpretation: Below normal speed for age/gender General Gait Details: cues for posture and position in RW   Stairs Stairs: Yes   Stair Management: Alternating pattern;Forwards;Two rails Number of Stairs: 5 General stair comments: guarding for safety but no assist needed  Wheelchair Mobility    Modified Rankin (Stroke Patients Only)       Balance Overall balance assessment: Needs assistance   Sitting balance-Leahy Scale: Good       Standing balance-Leahy Scale:  Good                      Cognition Arousal/Alertness: Awake/alert Behavior During Therapy: WFL for tasks assessed/performed Overall Cognitive Status: Within Functional Limits for tasks assessed                      Exercises      General Comments        Pertinent Vitals/Pain Pain Assessment: No/denies pain    Home Living Family/patient expects to be discharged to:: Private residence Living Arrangements: Spouse/significant other;Children                  Prior Function            PT Goals (current goals can now be found in the care plan section) Progress towards PT goals: Progressing toward goals    Frequency           PT Plan Current plan remains appropriate    Co-evaluation             End of Session Equipment Utilized During Treatment: Gait belt Activity Tolerance: Patient tolerated treatment well Patient left: in chair;with call bell/phone within reach;with chair alarm set     Time: [phone] PT Time Calculation (min) (ACUTE ONLY): 21 min  Charges:  $Gait Training: 8-22 mins                    G Codes:      Starr Engel B Deni Lefever 13-Jan-2017, 9:49 AM [name], [place]

## 2016-12-22 NOTE — Discharge Summary (Addendum)
Physician Discharge Summary  ZICHEN GADD [id number] DOB: 03/13/1923 DOA: 12/17/2016  PCP: Redge Gainer, MD  Admit date: 12/17/2016 Discharge date: 12/22/2016  Admitted From: Home Disposition:  Home  Recommendations for Outpatient Follow-up:  1. Follow up with PCP in 1-2 weeks 2. Take antibiotics for 4 days 3. Please monitor respiratory status 4. Get repeat CXR in 4-6 weeks 5. Please obtain BMP/CBC in one week   Home Health:Yes- PT/OT/Aide Equipment/Devices: Rolling walker with 5" wheels  Discharge Condition: Stable CODE STATUS: Full Code Diet recommendation: Heart Healthy  Brief/Interim Summary: Pt. with PMH of Day, GERD nausea and vomiting; admitted on 12/17/2016, with complaint of , nausea and vomiting, was found to have second-degree heart block as well as sepsis from pneumonia. Cardiology saw and evaluated patient and found first degree heart block and possibly a new RBBB.  Patient has not had any episodes of syncope.  He was treated for his infection with Unasyn which was transitioned to oral antibiotics.  He was seen by PT and OT.  Discharge Diagnoses:  Principal Problem:   Second degree AV block, Mobitz type I Active Problems:   Prostate cancer (HCC)   Essential hypertension   Right bundle branch block   GERD (gastroesophageal reflux disease)   Hyperlipemia   Depression   Abdominal pain, epigastric   Mobitz type 1 second degree atrioventricular block   Heart block    Discharge Instructions  Discharge Instructions    Call MD for:  difficulty breathing, headache or visual disturbances    Complete by:  As directed    Call MD for:  extreme fatigue    Complete by:  As directed    Call MD for:  persistant dizziness or light-headedness    Complete by:  As directed    Call MD for:  persistant nausea and vomiting    Complete by:  As directed    Call MD for:  severe uncontrolled pain    Complete by:  As directed    Call MD for:  temperature >100.4     Complete by:  As directed    Diet - low sodium heart healthy    Complete by:  As directed    Increase activity slowly    Complete by:  As directed    Walker     Complete by:  As directed      Allergies as of 12/22/2016   No Known Allergies     Medication List    TAKE these medications   amoxicillin-clavulanate 875-125 MG tablet Commonly known as:  AUGMENTIN Take 1 tablet by mouth every 12 (twelve) hours.   aspirin 81 MG EC tablet Take 81 mg by mouth daily after supper.   CALCIUM 600 + D PO Take 1 tablet by mouth daily.   lovastatin 20 MG tablet Commonly known as:  MEVACOR TAKE 1 TABLET DAILY What changed:  See the new instructions.   niacin 750 MG CR tablet Commonly known as:  NIASPAN TAKE 1 TABLET DAILY What changed:  See the new instructions.   potassium chloride SA 15 MEQ tablet Commonly known as:  KLOR-CON M15 Take 2 tablets (30 mEq total) by mouth daily.   tamsulosin 0.4 MG Caps capsule Commonly known as:  FLOMAX Take 1 capsule (0.4 mg total) by mouth daily.   Vitamin D (Cholecalciferol) 1000 units Tabs Take 1,000 Units by mouth daily.            Durable Medical Equipment  Start     Ordered   12/21/16 1304  For home use only DME Walker rolling  Once    Question:  Patient needs a walker to treat with the following condition  Answer:  Debility   12/21/16 1303     Follow-up Information    Redge Gainer, MD. Schedule an appointment as soon as possible for a visit in 1 week(s).   Specialty:  Family Medicine Why:  Will need follow up chest xray in 4 weeks Contact information: [place] [address] [phone]          No Known Allergies  Consultations:  Cardiology  PT/OT   Procedures/Studies: Dg Chest 1 View  Result Date: 12/18/2016 CLINICAL DATA:  Pneumonia EXAM: CHEST 1 VIEW COMPARISON:  Chest radiograph from one day prior. FINDINGS: Stable cardiomediastinal silhouette with normal heart size and aortic  atherosclerosis. No pneumothorax. No pleural effusion. Slightly low lung volumes. Stable subcentimeter granuloma in the peripheral upper right lung. Mild hazy opacity in the medial upper right lung appears slightly increased. Otherwise no consolidative airspace disease. IMPRESSION: 1. Mild hazy opacity in the medial upper right lung, slightly increased, potentially a developing pneumonia. Recommend follow-up PA and lateral post treatment chest radiographs in 4-6 weeks. 2. Aortic atherosclerosis. Electronically Signed   By: Ilona Sorrel M.D.   On: 12/18/2016 11:40      Subjective: Patient seen initially ambulating with PT.  He was walking the halls with a walker.  Had a bowel movement yesterday.  Discharge Exam: Vitals:   12/22/16 0834 12/22/16 0945  BP: (!) 142/71   Pulse: 79 81  Resp: 18   Temp: 97.7 F (36.5 C)    Vitals:   12/21/16 2040 12/22/16 0500 12/22/16 0834 12/22/16 0945  BP: (!) 154/73 (!) 148/73 (!) 142/71   Pulse: 89 89 79 81  Resp: 20 18 18    Temp: 98.9 F (37.2 C) 97.9 F (36.6 C) 97.7 F (36.5 C)   TempSrc: Oral Oral Oral   SpO2: 95% 96% 97%   Weight:        General: Pt is alert, awake, not in acute distress Cardiovascular: RRR, S1/S2 +, no rubs, no gallops Respiratory: CTA bilaterally, no wheezing, no rhonchi Abdominal: Soft, NT, ND, bowel sounds + Extremities: no edema, no cyanosis    The results of significant diagnostics from this hospitalization (including imaging, microbiology, ancillary and laboratory) are listed below for reference.     Microbiology: Recent Results (from the past 240 hour(s))  Culture, blood (x 2)     Status: None (Preliminary result)   Collection Time: 12/19/16  8:09 AM  Result Value Ref Range Status   Specimen Description BLOOD LEFT ANTECUBITAL  Final   Special Requests IN PEDIATRIC BOTTLE 2CC  Final   Culture NO GROWTH 2 DAYS  Final   Report Status PENDING  Incomplete  Culture, blood (x 2)     Status: None (Preliminary  result)   Collection Time: 12/19/16  8:11 AM  Result Value Ref Range Status   Specimen Description BLOOD LEFT HAND  Final   Special Requests BOTTLES DRAWN AEROBIC ONLY 5CC  Final   Culture NO GROWTH 2 DAYS  Final   Report Status PENDING  Incomplete     Labs: BNP (last 3 results) No results for input(s): BNP in the last 8760 hours. Basic Metabolic Panel:  Recent Labs Lab 12/18/16 0403 12/19/16 0808 12/20/16 0249 12/21/16 0401  NA 134* 137 137 138  K 3.9 4.0  3.1* 3.9  CL 100* 105 106 106  CO2 25 23 24 24   GLUCOSE 140* 141* 128* 100*  BUN 13 12 11 12   CREATININE 0.98 0.89 0.83 0.79  CALCIUM 8.9 8.5* 8.0* 7.9*   Liver Function Tests:  Recent Labs Lab 12/18/16 0403 12/19/16 0808  AST 27 28  ALT 18 16*  ALKPHOS 65 63  BILITOT 0.9 1.1  PROT 6.6 6.2*  ALBUMIN 3.1* 2.8*   No results for input(s): LIPASE, AMYLASE in the last 168 hours. No results for input(s): AMMONIA in the last 168 hours. CBC:  Recent Labs Lab 12/18/16 0403 12/18/16 0904 12/19/16 0808 12/20/16 0249 12/21/16 0401  WBC 23.1* 23.6* 23.9* 21.4* 15.7*  NEUTROABS  --  20.9* 21.1* 18.5*  --   HGB 14.3 15.2 14.1 13.3 12.5*  HCT 42.3 44.3 41.6 38.9* 38.2*  MCV 94.2 94.3 93.7 93.3 96.0  PLT 220 215 188 193 182   Cardiac Enzymes:  Recent Labs Lab 12/17/16 0654 12/17/16 1309 12/17/16 1859 12/19/16 0808  TROPONINI 0.03* 0.04* 0.10* 0.15*   BNP: Invalid input(s): POCBNP CBG: No results for input(s): GLUCAP in the last 168 hours. D-Dimer No results for input(s): DDIMER in the last 72 hours. Hgb A1c No results for input(s): HGBA1C in the last 72 hours. Lipid Profile No results for input(s): CHOL, HDL, LDLCALC, TRIG, CHOLHDL, LDLDIRECT in the last 72 hours. Thyroid function studies No results for input(s): TSH, T4TOTAL, T3FREE, THYROIDAB in the last 72 hours.  Invalid input(s): FREET3 Anemia work up No results for input(s): VITAMINB12, FOLATE, FERRITIN, TIBC, IRON, RETICCTPCT in the last 72  hours. Urinalysis    Component Value Date/Time   COLORURINE YELLOW 12/18/2016 1110   APPEARANCEUR HAZY (A) 12/18/2016 1110   LABSPEC 1.023 12/18/2016 1110   PHURINE 5.0 12/18/2016 1110   GLUCOSEU NEGATIVE 12/18/2016 1110   HGBUR LARGE (A) 12/18/2016 1110   BILIRUBINUR NEGATIVE 12/18/2016 1110   BILIRUBINUR neg 01/22/2016 1600   KETONESUR 5 (A) 12/18/2016 1110   PROTEINUR 30 (A) 12/18/2016 1110   UROBILINOGEN negative 01/22/2016 1600   NITRITE NEGATIVE 12/18/2016 1110   LEUKOCYTESUR NEGATIVE 12/18/2016 1110   Sepsis Labs Invalid input(s): PROCALCITONIN,  WBC,  LACTICIDVEN Microbiology Recent Results (from the past 240 hour(s))  Culture, blood (x 2)     Status: None (Preliminary result)   Collection Time: 12/19/16  8:09 AM  Result Value Ref Range Status   Specimen Description BLOOD LEFT ANTECUBITAL  Final   Special Requests IN PEDIATRIC BOTTLE 2CC  Final   Culture NO GROWTH 2 DAYS  Final   Report Status PENDING  Incomplete  Culture, blood (x 2)     Status: None (Preliminary result)   Collection Time: 12/19/16  8:11 AM  Result Value Ref Range Status   Specimen Description BLOOD LEFT HAND  Final   Special Requests BOTTLES DRAWN AEROBIC ONLY 5CC  Final   Culture NO GROWTH 2 DAYS  Final   Report Status PENDING  Incomplete     Time coordinating discharge: Over 30 minutes  SIGNED:   Loretha Stapler, MD  [facility] 12/22/2016, 11:36 AM Pager [phone] If 7PM-7AM, please contact night-coverage [email or web address] Password [email or web address]

## 2016-12-22 NOTE — Care Management Note (Signed)
Case Management Note  Patient Details  Name: Alec Davis MRN: [id number] Date of Birth: January 02, 1923  Subjective/Objective:    Pt presented for AV block 2nd degrees, sepsis from PNA. Pt is from home with wife and has support of daughter in town until the 9th of Jan.                  Action/Plan: PT recommendations for PT services- Pt is agreeable. No HHRN needed. CM did speak with pt/ family in regards to [place]. Agency List provided and pt chose [facility]. CM did make referral and SOC to begin within 24-48 hours post d/c. DME RW needed. CM did ask to get one ordered before d/c. Wife stated she will pick on way home from [facility]. No further needs from CM at this time.   Expected Discharge Date:                  Expected Discharge Plan:  [place]  In-House Referral:  NA  Discharge planning Services  CM Consult  Post Acute Care Choice:  Home Health Choice offered to:  Patient, Adult Children  DME Arranged:  N/A DME Agency:  NA  HH Arranged:  PT [place] Agency:  [place]  Status of Service:  Completed, signed off  If discussed at [place] of Stay Meetings, dates discussed:    Additional Comments:  Bethena Roys, RN 12/22/2016, 12:33 PM

## 2016-12-23 DIAGNOSIS — Z8546 Personal history of malignant neoplasm of prostate: Secondary | ICD-10-CM | POA: Diagnosis not present

## 2016-12-23 DIAGNOSIS — I441 Atrioventricular block, second degree: Secondary | ICD-10-CM | POA: Diagnosis not present

## 2016-12-23 DIAGNOSIS — J189 Pneumonia, unspecified organism: Secondary | ICD-10-CM | POA: Diagnosis not present

## 2016-12-23 DIAGNOSIS — F329 Major depressive disorder, single episode, unspecified: Secondary | ICD-10-CM | POA: Diagnosis not present

## 2016-12-23 DIAGNOSIS — I2510 Atherosclerotic heart disease of native coronary artery without angina pectoris: Secondary | ICD-10-CM | POA: Diagnosis not present

## 2016-12-23 DIAGNOSIS — E785 Hyperlipidemia, unspecified: Secondary | ICD-10-CM | POA: Diagnosis not present

## 2016-12-23 DIAGNOSIS — I10 Essential (primary) hypertension: Secondary | ICD-10-CM | POA: Diagnosis not present

## 2016-12-24 ENCOUNTER — Telehealth: Payer: Self-pay | Admitting: Family Medicine

## 2016-12-24 LAB — CULTURE, BLOOD (ROUTINE X 2)
CULTURE: NO GROWTH
Culture: NO GROWTH

## 2016-12-24 NOTE — Telephone Encounter (Signed)
Daughter called and was given names of a few sitters in this area.

## 2016-12-25 DIAGNOSIS — I441 Atrioventricular block, second degree: Secondary | ICD-10-CM | POA: Diagnosis not present

## 2016-12-25 DIAGNOSIS — E785 Hyperlipidemia, unspecified: Secondary | ICD-10-CM | POA: Diagnosis not present

## 2016-12-25 DIAGNOSIS — I2510 Atherosclerotic heart disease of native coronary artery without angina pectoris: Secondary | ICD-10-CM | POA: Diagnosis not present

## 2016-12-25 DIAGNOSIS — F329 Major depressive disorder, single episode, unspecified: Secondary | ICD-10-CM | POA: Diagnosis not present

## 2016-12-25 DIAGNOSIS — J189 Pneumonia, unspecified organism: Secondary | ICD-10-CM | POA: Diagnosis not present

## 2016-12-25 DIAGNOSIS — I10 Essential (primary) hypertension: Secondary | ICD-10-CM | POA: Diagnosis not present

## 2016-12-28 DIAGNOSIS — J189 Pneumonia, unspecified organism: Secondary | ICD-10-CM | POA: Diagnosis not present

## 2016-12-28 DIAGNOSIS — E785 Hyperlipidemia, unspecified: Secondary | ICD-10-CM | POA: Diagnosis not present

## 2016-12-28 DIAGNOSIS — F329 Major depressive disorder, single episode, unspecified: Secondary | ICD-10-CM | POA: Diagnosis not present

## 2016-12-28 DIAGNOSIS — I441 Atrioventricular block, second degree: Secondary | ICD-10-CM | POA: Diagnosis not present

## 2016-12-28 DIAGNOSIS — I2510 Atherosclerotic heart disease of native coronary artery without angina pectoris: Secondary | ICD-10-CM | POA: Diagnosis not present

## 2016-12-28 DIAGNOSIS — I10 Essential (primary) hypertension: Secondary | ICD-10-CM | POA: Diagnosis not present

## 2017-01-04 ENCOUNTER — Encounter: Payer: Self-pay | Admitting: Family Medicine

## 2017-01-04 ENCOUNTER — Ambulatory Visit (INDEPENDENT_AMBULATORY_CARE_PROVIDER_SITE_OTHER): Payer: Medicare Other | Admitting: Family Medicine

## 2017-01-04 VITALS — BP 124/79 | HR 73 | Temp 96.6°F | Ht 71.0 in | Wt 193.0 lb

## 2017-01-04 DIAGNOSIS — G933 Postviral fatigue syndrome: Secondary | ICD-10-CM | POA: Diagnosis not present

## 2017-01-04 DIAGNOSIS — G9331 Postviral fatigue syndrome: Secondary | ICD-10-CM

## 2017-01-04 DIAGNOSIS — Z09 Encounter for follow-up examination after completed treatment for conditions other than malignant neoplasm: Secondary | ICD-10-CM | POA: Diagnosis not present

## 2017-01-04 DIAGNOSIS — J189 Pneumonia, unspecified organism: Secondary | ICD-10-CM

## 2017-01-04 NOTE — Progress Notes (Signed)
Subjective:    Patient ID: Alec Davis, male    DOB: 23-Oct-1923, 83 y.o.   MRN: [id number]  HPI Patient here today for hospital follow up from [facility] 12/17/16.The patient comes to the visit today with his daughter and his wife who is also been just discharged from the hospital also with community-acquired pneumonia. She remains extremely weak process or sputum is now white in color. The patient is passing his water well and has an improving appetite and his bowels are moving regularly now. He appears to be getting his strength back from the hospitalization and the pneumonia. He understands that we need to get another chest x-ray in 3-4 weeks.    Patient Active Problem List   Diagnosis Date Noted  . Heart block 12/18/2016  . Second degree AV block, Mobitz type I 12/17/2016  . Abdominal pain, epigastric 12/17/2016  . Mobitz type 1 second degree atrioventricular block   . Hyperlipemia 08/03/2013  . Depression 08/03/2013  . Vitamin D deficiency 03/07/2011  . GERD (gastroesophageal reflux disease) 03/07/2011  . Prostate cancer ([place]) 01/19/2011  . Essential hypertension 01/19/2011  . Right bundle branch block 01/19/2011  . INTERNAL HEMORRHOIDS 01/19/2011  . DIVERTICULOSIS, COLON 01/19/2011  . COLONIC POLYPS, HX OF 01/19/2011   Outpatient Encounter Prescriptions as of 01/04/2017  Medication Sig  . aspirin 81 MG EC tablet Take 81 mg by mouth daily after supper.    . Calcium Carb-Cholecalciferol (CALCIUM 600 + D PO) Take 1 tablet by mouth daily.  Marland Kitchen lovastatin (MEVACOR) 20 MG tablet TAKE 1 TABLET DAILY (Patient taking differently: Take 20 mg by mouth daily)  . niacin (NIASPAN) 750 MG CR tablet TAKE 1 TABLET DAILY (Patient taking differently: Take 750 mg by mouth daily)  . potassium chloride SA (KLOR-CON M15) 15 MEQ tablet Take 2 tablets (30 mEq total) by mouth daily.  . tamsulosin (FLOMAX) 0.4 MG CAPS capsule Take 1 capsule (0.4 mg total) by mouth daily.  . Vitamin D, Cholecalciferol,  1000 UNITS TABS Take 1,000 Units by mouth daily.    No facility-administered encounter medications on file as of 01/04/2017.       Review of Systems  Constitutional: Positive for fatigue.  HENT: Negative.   Eyes: Negative.   Respiratory: Negative.   Cardiovascular: Negative.   Gastrointestinal: Negative.   Endocrine: Negative.   Genitourinary: Negative.   Musculoskeletal: Negative.   Skin: Negative.   Allergic/Immunologic: Negative.   Neurological: Negative.   Hematological: Negative.   Psychiatric/Behavioral: Negative.        Objective:   Physical Exam  Constitutional: He is oriented to person, place, and time. He appears well-developed and well-nourished. No distress.  The patient is elderly but alert and appears to be recovering well from this pneumonia.  HENT:  Head: Normocephalic and atraumatic.  Right Ear: External ear normal.  Left Ear: External ear normal.  Nose: Nose normal.  Mouth/Throat: Oropharynx is clear and moist. No oropharyngeal exudate.  Eyes: Conjunctivae and EOM are normal. Pupils are equal, round, and reactive to light. Right eye exhibits no discharge. Left eye exhibits no discharge. No scleral icterus.  Neck: Normal range of motion. Neck supple. No thyromegaly present.  Cardiovascular: Normal rate, regular rhythm and normal heart sounds.   No murmur heard. Pulmonary/Chest: Effort normal and breath sounds normal. No respiratory distress. He has no wheezes. He has no rales. He exhibits no tenderness.  Clear anteriorly and posteriorly  Abdominal: Soft. Bowel sounds are normal. He exhibits no mass. There is  no tenderness. There is no rebound and no guarding.  Musculoskeletal: Normal range of motion. He exhibits no edema or tenderness.  Lymphadenopathy:    He has no cervical adenopathy.  Neurological: He is alert and oriented to person, place, and time. No cranial nerve deficit.  Skin: Skin is warm and dry. No rash noted.  Psychiatric: He has a normal  mood and affect. His behavior is normal. Judgment and thought content normal.  Nursing note and vitals reviewed.  BP 124/79 (BP Location: Left Arm)   Pulse 73   Temp (!) 96.6 F (35.9 C) (Oral)   Ht 5' 11"  (6.381 m)   Wt 193 lb (87.5 kg)   SpO2 100%   BMI 26.92 kg/m         Assessment & Plan:  1. Hospital discharge follow-up -The patient is recovering from a community-acquired pneumonia. He did take Augmentin and this has been completed. He is currently not taking any medicine. We will ask him to restart and take Mucinex one twice daily to keep the congestion loose so that it comes up easily. - CBC with Differential/Platelet - BMP8+EGFR  2. Community acquired pneumonia, unspecified laterality -Repeat chest x-ray in 3-4 weeks -Get CBC and BMP today.  3. Postviral fatigue syndrome -Continue to drink plenty of fluids and stay well hydrated and gradually increase activity  Patient Instructions  Continue to drink plenty of fluids and stay well hydrated Take Tylenol for aches pains and fever Take Mucinex as needed for cough and congestion Use nasal saline for head congestion Return to clinic in 3-4 weeks for repeat chest x-ray  Arrie Senate MD

## 2017-01-04 NOTE — Patient Instructions (Signed)
Continue to drink plenty of fluids and stay well hydrated Take Tylenol for aches pains and fever Take Mucinex as needed for cough and congestion Use nasal saline for head congestion Return to clinic in 3-4 weeks for repeat chest x-ray

## 2017-01-05 LAB — CBC WITH DIFFERENTIAL/PLATELET
BASOS ABS: 0.1 10*3/uL (ref 0.0–0.2)
Basos: 1 %
EOS (ABSOLUTE): 0.1 10*3/uL (ref 0.0–0.4)
Eos: 1 %
Hematocrit: 39.8 % (ref 37.5–51.0)
Hemoglobin: 13.9 g/dL (ref 13.0–17.7)
IMMATURE GRANS (ABS): 0.1 10*3/uL (ref 0.0–0.1)
Immature Granulocytes: 1 %
LYMPHS: 23 %
Lymphocytes Absolute: 2 10*3/uL (ref 0.7–3.1)
MCH: 31.9 pg (ref 26.6–33.0)
MCHC: 34.9 g/dL (ref 31.5–35.7)
MCV: 91 fL (ref 79–97)
Monocytes Absolute: 0.9 10*3/uL (ref 0.1–0.9)
Monocytes: 10 %
NEUTROS ABS: 5.5 10*3/uL (ref 1.4–7.0)
Neutrophils: 64 %
Platelets: 422 10*3/uL — ABNORMAL HIGH (ref 150–379)
RBC: 4.36 x10E6/uL (ref 4.14–5.80)
RDW: 13.7 % (ref 12.3–15.4)
WBC: 8.5 10*3/uL (ref 3.4–10.8)

## 2017-01-05 LAB — BMP8+EGFR
BUN/Creatinine Ratio: 12 (ref 10–24)
BUN: 12 mg/dL (ref 10–36)
CO2: 26 mmol/L (ref 18–29)
Calcium: 9.2 mg/dL (ref 8.6–10.2)
Chloride: 95 mmol/L — ABNORMAL LOW (ref 96–106)
Creatinine, Ser: 0.98 mg/dL (ref 0.76–1.27)
GFR calc non Af Amer: 67 mL/min/{1.73_m2} (ref >59)
GFR, EST AFRICAN AMERICAN: 78 mL/min/{1.73_m2} (ref >59)
GLUCOSE: 108 mg/dL — AB (ref 65–99)
Potassium: 4.3 mmol/L (ref 3.5–5.2)
Sodium: 140 mmol/L (ref 134–144)

## 2017-01-20 ENCOUNTER — Ambulatory Visit (INDEPENDENT_AMBULATORY_CARE_PROVIDER_SITE_OTHER): Payer: Medicare Other | Admitting: Family Medicine

## 2017-01-20 DIAGNOSIS — I2510 Atherosclerotic heart disease of native coronary artery without angina pectoris: Secondary | ICD-10-CM

## 2017-01-20 DIAGNOSIS — J189 Pneumonia, unspecified organism: Secondary | ICD-10-CM | POA: Diagnosis not present

## 2017-01-20 DIAGNOSIS — E785 Hyperlipidemia, unspecified: Secondary | ICD-10-CM

## 2017-01-20 DIAGNOSIS — I441 Atrioventricular block, second degree: Secondary | ICD-10-CM

## 2017-02-11 ENCOUNTER — Telehealth: Payer: Self-pay | Admitting: Family Medicine

## 2017-02-11 NOTE — Telephone Encounter (Signed)
Alec Davis,   Mr. Bussiere daughter, Corliss Parish [phone], who lives in [place], called and wants to make Dr. Laurance Flatten aware that they are noticing some decline in her dad.  He is not eating well and she said he is taking longer and longer naps and sleeping more frequently.  She said he has an upcoming appointment on 03/01/17 and she would like for Dr. Laurance Flatten to call her after this appointment so they may talk.  She is encouraging them to move into an assisted living facility but she said so far they have not been agreeable.  She would appreciate any help [facility] could give her in encouraging this also.  Told her I would pass this information along to you since Dr. Laurance Flatten is not here and you would inform him when he returns.

## 2017-02-17 ENCOUNTER — Ambulatory Visit (INDEPENDENT_AMBULATORY_CARE_PROVIDER_SITE_OTHER): Payer: Medicare Other

## 2017-02-17 ENCOUNTER — Encounter (INDEPENDENT_AMBULATORY_CARE_PROVIDER_SITE_OTHER): Payer: Self-pay

## 2017-02-17 ENCOUNTER — Encounter: Payer: Self-pay | Admitting: Family Medicine

## 2017-02-17 ENCOUNTER — Ambulatory Visit (INDEPENDENT_AMBULATORY_CARE_PROVIDER_SITE_OTHER): Payer: Medicare Other | Admitting: Family Medicine

## 2017-02-17 VITALS — BP 112/70 | HR 76 | Temp 96.8°F | Ht 71.0 in | Wt 183.8 lb

## 2017-02-17 DIAGNOSIS — J189 Pneumonia, unspecified organism: Secondary | ICD-10-CM

## 2017-02-17 DIAGNOSIS — R5383 Other fatigue: Secondary | ICD-10-CM

## 2017-02-17 NOTE — Patient Instructions (Signed)
Great to see you!  We will call within 1 week with lab results and x ray.   Please let us know if any new symptoms arise, please keep your appointment with Dr. Laurance Flatten As planned.

## 2017-02-17 NOTE — Progress Notes (Signed)
   HPI  Patient presents today here with fatigue and weight loss.  Patient and his wife explained that he was admitted to the hospital in late December for pneumonia. He had resolution of his symptoms related to pneumonia but continues to have fatigue, loss of appetite, and weight loss.  Is a history of prostate cancer.  He denies any shortness of breath, chest pain, fevers, or chills. He is hard of hearing and left his hearing aids at home today.  PMH: Smoking status noted ROS: Per HPI  Objective: BP 112/70   Pulse 76   Temp (!) 96.8 F (36 C) (Oral)   Ht '5\' 11"'$  (1.803 m)   Wt 183 lb 12.8 oz (83.4 kg)   BMI 25.63 kg/m  Gen: NAD, alert, cooperative with exam HEENT: NCAT, oropharynx moist and clear, TMs normal bilaterally with heavy CV: RRR, good S1/S2, no murmur Resp: CTABL, no wheezes, non-labored Ext: No edema, warm Neuro: Alert and oriented, No gross deficits  Assessment and plan:  # Fatigue, community-acquired pneumonia Repeat x-ray to ensure complete resolution of infiltrate Basic labs including CBC, thyroid, CMP. Discussed with patient and his wife that if all of this workup is needed to follow-up with PCP as planned in about 2 weeks. Could consider Remeron to stimulate appetite, this was not discussed with patient.    Orders Placed This Encounter  Procedures  . DG Chest 2 View    Standing Status:   Future    Number of Occurrences:   1    Standing Expiration Date:   04/17/2018    Order Specific Question:   Reason for Exam (SYMPTOM  OR DIAGNOSIS REQUIRED)    Answer:   f/u CAP    Order Specific Question:   Preferred imaging location?    Answer:   Internal  . CBC with Differential/Platelet  . CMP14+EGFR  . TSH    Laroy Apple, MD [facility] 02/17/2017, 11:46 AM

## 2017-02-18 ENCOUNTER — Ambulatory Visit: Payer: Self-pay | Admitting: Family Medicine

## 2017-02-18 LAB — CMP14+EGFR
ALT: 20 IU/L (ref 0–44)
AST: 29 IU/L (ref 0–40)
Albumin/Globulin Ratio: 1.2 (ref 1.2–2.2)
Albumin: 3.8 g/dL (ref 3.2–4.6)
Alkaline Phosphatase: 80 IU/L (ref 39–117)
BUN/Creatinine Ratio: 12 (ref 10–24)
BUN: 12 mg/dL (ref 10–36)
Bilirubin Total: 0.7 mg/dL (ref 0.0–1.2)
CALCIUM: 9.3 mg/dL (ref 8.6–10.2)
CO2: 24 mmol/L (ref 18–29)
Chloride: 95 mmol/L — ABNORMAL LOW (ref 96–106)
Creatinine, Ser: 0.97 mg/dL (ref 0.76–1.27)
GFR, EST AFRICAN AMERICAN: 79 mL/min/{1.73_m2} (ref >59)
GFR, EST NON AFRICAN AMERICAN: 68 mL/min/{1.73_m2} (ref >59)
GLUCOSE: 105 mg/dL — AB (ref 65–99)
Globulin, Total: 3.2 g/dL (ref 1.5–4.5)
POTASSIUM: 3.9 mmol/L (ref 3.5–5.2)
Sodium: 140 mmol/L (ref 134–144)
TOTAL PROTEIN: 7 g/dL (ref 6.0–8.5)

## 2017-02-18 LAB — CBC WITH DIFFERENTIAL/PLATELET
BASOS ABS: 0 10*3/uL (ref 0.0–0.2)
Basos: 0 %
EOS (ABSOLUTE): 0.1 10*3/uL (ref 0.0–0.4)
Eos: 1 %
Hematocrit: 39.1 % (ref 37.5–51.0)
Hemoglobin: 13.2 g/dL (ref 13.0–17.7)
IMMATURE GRANS (ABS): 0.1 10*3/uL (ref 0.0–0.1)
Immature Granulocytes: 1 %
LYMPHS: 13 %
Lymphocytes Absolute: 1.2 10*3/uL (ref 0.7–3.1)
MCH: 31.1 pg (ref 26.6–33.0)
MCHC: 33.8 g/dL (ref 31.5–35.7)
MCV: 92 fL (ref 79–97)
MONOS ABS: 1 10*3/uL — AB (ref 0.1–0.9)
Monocytes: 10 %
NEUTROS PCT: 75 %
Neutrophils Absolute: 7.2 10*3/uL — ABNORMAL HIGH (ref 1.4–7.0)
PLATELETS: 354 10*3/uL (ref 150–379)
RBC: 4.24 x10E6/uL (ref 4.14–5.80)
RDW: 13.4 % (ref 12.3–15.4)
WBC: 9.7 10*3/uL (ref 3.4–10.8)

## 2017-02-18 LAB — TSH: TSH: 0.956 u[IU]/mL (ref 0.450–4.500)

## 2017-03-01 ENCOUNTER — Ambulatory Visit: Payer: Medicare Other | Admitting: Family Medicine

## 2017-03-10 ENCOUNTER — Ambulatory Visit (INDEPENDENT_AMBULATORY_CARE_PROVIDER_SITE_OTHER): Payer: Medicare Other | Admitting: Family Medicine

## 2017-03-10 ENCOUNTER — Encounter: Payer: Self-pay | Admitting: Family Medicine

## 2017-03-10 VITALS — BP 92/60 | HR 87 | Temp 97.2°F | Ht 71.0 in | Wt 184.0 lb

## 2017-03-10 DIAGNOSIS — E784 Other hyperlipidemia: Secondary | ICD-10-CM | POA: Diagnosis not present

## 2017-03-10 DIAGNOSIS — R5383 Other fatigue: Secondary | ICD-10-CM | POA: Diagnosis not present

## 2017-03-10 DIAGNOSIS — I10 Essential (primary) hypertension: Secondary | ICD-10-CM | POA: Diagnosis not present

## 2017-03-10 DIAGNOSIS — R630 Anorexia: Secondary | ICD-10-CM | POA: Diagnosis not present

## 2017-03-10 DIAGNOSIS — H9193 Unspecified hearing loss, bilateral: Secondary | ICD-10-CM

## 2017-03-10 DIAGNOSIS — C61 Malignant neoplasm of prostate: Secondary | ICD-10-CM | POA: Diagnosis not present

## 2017-03-10 DIAGNOSIS — K219 Gastro-esophageal reflux disease without esophagitis: Secondary | ICD-10-CM

## 2017-03-10 DIAGNOSIS — R252 Cramp and spasm: Secondary | ICD-10-CM | POA: Diagnosis not present

## 2017-03-10 DIAGNOSIS — R5381 Other malaise: Secondary | ICD-10-CM | POA: Diagnosis not present

## 2017-03-10 DIAGNOSIS — E559 Vitamin D deficiency, unspecified: Secondary | ICD-10-CM | POA: Diagnosis not present

## 2017-03-10 DIAGNOSIS — E7849 Other hyperlipidemia: Secondary | ICD-10-CM

## 2017-03-10 NOTE — Progress Notes (Signed)
Subjective:    Patient ID: Alec Davis, male    DOB: 08-22-1923, 83 y.o.   MRN: [id number]  HPI Pt here for follow up and management of chronic medical problems which includes hyperlipidemia and hypertension. He is taking medication regularly.The patient has had a rough winter. His wife had community-acquired pneumonia and within a couple weeks, he had a community-acquired pneumonia and had a be hospitalized. This is made him very weak. He is subsequently had blood work and a repeat chest x-ray which did not show any further pneumonia. He is still followed regularly for his prostate cancer by Dr. Rosana Hoes. The patient is pleasant and alert but just seems more tired feeling. He has no specific complaints. He denies any chest pain or shortness of breath. He just says he feels tired and wants to sleep more. He's been sitting around more. He complains of leg cramps also which he normally does not have. His last BMP less than 3 weeks ago was stable. He does not drink enough water and plans to drink more water. He denies any trouble with swallowing heartburn indigestion nausea vomiting diarrhea blood in the stool or black tarry bowel movements and is passing his water without problems.   Patient Active Problem List   Diagnosis Date Noted  . Heart block 12/18/2016  . Second degree AV block, Mobitz type I 12/17/2016  . Abdominal pain, epigastric 12/17/2016  . Mobitz type 1 second degree atrioventricular block   . Hyperlipemia 08/03/2013  . Depression 08/03/2013  . Vitamin D deficiency 03/07/2011  . GERD (gastroesophageal reflux disease) 03/07/2011  . Prostate cancer ([place]) 01/19/2011  . Essential hypertension 01/19/2011  . Right bundle branch block 01/19/2011  . INTERNAL HEMORRHOIDS 01/19/2011  . DIVERTICULOSIS, COLON 01/19/2011  . COLONIC POLYPS, HX OF 01/19/2011   Outpatient Encounter Prescriptions as of 03/10/2017  Medication Sig  . aspirin 81 MG EC tablet Take 81 mg by mouth daily after  supper.    . Calcium Carb-Cholecalciferol (CALCIUM 600 + D PO) Take 1 tablet by mouth daily.  Marland Kitchen lovastatin (MEVACOR) 20 MG tablet TAKE 1 TABLET DAILY (Patient taking differently: Take 20 mg by mouth daily)  . tamsulosin (FLOMAX) 0.4 MG CAPS capsule Take 1 capsule (0.4 mg total) by mouth daily.  . Vitamin D, Cholecalciferol, 1000 UNITS TABS Take 1,000 Units by mouth daily.    No facility-administered encounter medications on file as of 03/10/2017.       Review of Systems  Constitutional: Positive for appetite change (down - getting some better) and unexpected weight change (loss).  HENT: Positive for hearing loss.   Eyes: Negative.   Respiratory: Negative.   Cardiovascular: Negative.   Gastrointestinal: Negative.   Endocrine: Negative.   Genitourinary: Negative.   Musculoskeletal: Negative.   Skin: Negative.   Allergic/Immunologic: Negative.   Neurological: Negative.   Hematological: Negative.   Psychiatric/Behavioral: Positive for confusion (more forgetful).       Objective:   Physical Exam  Constitutional: He is oriented to person, place, and time. He appears well-developed and well-nourished. No distress.  The patient is pleasant and alert and has no specific complaints.  HENT:  Head: Normocephalic and atraumatic.  Right Ear: External ear normal.  Left Ear: External ear normal.  Nose: Nose normal.  Mouth/Throat: Oropharynx is clear and moist. No oropharyngeal exudate.  He does wear a hearing aid in the left ear.  Eyes: Conjunctivae and EOM are normal. Pupils are equal, round, and reactive to light. Right eye exhibits  no discharge. Left eye exhibits no discharge. No scleral icterus.  Neck: Normal range of motion. Neck supple. No thyromegaly present.  Cardiovascular: Normal rate, regular rhythm and normal heart sounds.   No murmur heard. The heart is regular at 84/m.  Pulmonary/Chest: Effort normal and breath sounds normal. No respiratory distress. He has no wheezes. He has  no rales. He exhibits no tenderness.  Clear anteriorly and posteriorly and no axillary adenopathy  Abdominal: Soft. Bowel sounds are normal. He exhibits no mass. There is no tenderness. There is no rebound and no guarding.  No abdominal tenderness masses or organ enlargement. The buttocks were checked and there was no erythema or redness or drainage from where one buttock was rubbing another.  Musculoskeletal: Normal range of motion. He exhibits no edema.  Lymphadenopathy:    He has no cervical adenopathy.  Neurological: He is alert and oriented to person, place, and time. He has normal reflexes. No cranial nerve deficit.  Skin: Skin is warm and dry. No rash noted.  There were excoriations on his abdomen. The daughter was reminded to check and make sure that they were not using any soaps that were stented fabric softeners that were scented or detergents that were scented. There is a sebaceous cyst that is not inflamed on his right clavicle that he was asking about.  Psychiatric: He has a normal mood and affect. His behavior is normal. Judgment and thought content normal.  Nursing note and vitals reviewed.  BP 92/60 (BP Location: Left Arm)   Pulse 87   Temp 97.2 F (36.2 C) (Oral)   Ht 5\' 11"  (1.803 m)   Wt 184 lb (83.5 kg)   BMI 25.66 kg/m         Assessment & Plan:  1. Vitamin D deficiency -Continue vitamin D replacement   2. Other hyperlipidemia -Continue with healthy eating habits and drinking plenty of fluids  3. Gastroesophageal reflux disease, esophagitis presence not specified -There were no complaints with reflux symptoms today.  4. PROSTATE CANCER -Follow-up with Dr. Rosana Hoes yearly  5. Essential hypertension -Blood pressure was stable today and slightly decreased.  6. Decreased hearing of both ears -Referral to Dr. Thornell Mule - Ambulatory referral to ENT  7. Malaise and fatigue -Arrange for physical therapy low impact after the flu season resolves and encourage more  activity around the home  8. Decreased appetite -Add multivitamin  9. Leg cramps -The patient is encouraged to drink more water and do this on a regular basis every day.  Patient Instructions                       Medicare Annual Wellness Visit  [facility] and the medical providers at [facility] strive to bring you the best medical care.  In doing so we not only want to address your current medical conditions and concerns but also to detect new conditions early and prevent illness, disease and health-related problems.    Medicare offers a yearly Wellness Visit which allows our clinical staff to assess your need for preventative services including immunizations, lifestyle education, counseling to decrease risk of preventable diseases and screening for fall risk and other medical concerns.    This visit is provided free of charge (no copay) for all Medicare recipients. The clinical pharmacists at [facility] have begun to conduct these Wellness Visits which will also include a thorough review of all your medications.    As you primary medical  provider recommend that you make an appointment for your Annual Wellness Visit if you have not done so already this year.  You may set up this appointment before you leave today or you may call back ([phone]) and schedule an appointment.  Please make sure when you call that you mention that you are scheduling your Annual Wellness Visit with the clinical pharmacist so that the appointment may be made for the proper length of time.     Continue current medications. Continue good therapeutic lifestyle changes which include good diet and exercise. Fall precautions discussed with patient. If an FOBT was given today- please return it to our front desk. If you are over 9 years old - you may need Prevnar 92 or the adult Pneumonia vaccine.  **Flu shots are available--- please call and schedule a FLU-CLINIC  appointment**  After your visit with [place] today you will receive a survey in the mail or online from [employer] regarding your care with [place]. Please take a moment to fill this out. Your feedback is very important to [place] as you can help [place] better understand your patient needs as well as improve your experience and satisfaction. WE CARE ABOUT YOU!!!   The patient should continue to follow-up with the urologist, Dr. Rosana Hoes because of his prostate cancer. He should continue with his current treatment He should make every effort to not put himself for risk for falling He should consider getting some physical therapy as soon as the flu season dies down to help with strengthening specimen since he's been so sick during the winter months. In the meantime he should drink plenty of fluids and stay well hydrated Try cortisone 10 sparingly to the abdomen Use scent free fabric softeners detergents and soaps  Arrie Senate MD

## 2017-03-10 NOTE — Patient Instructions (Addendum)
Medicare Annual Wellness Visit  [facility] and the medical providers at [facility] strive to bring you the best medical care.  In doing so we not only want to address your current medical conditions and concerns but also to detect new conditions early and prevent illness, disease and health-related problems.    Medicare offers a yearly Wellness Visit which allows our clinical staff to assess your need for preventative services including immunizations, lifestyle education, counseling to decrease risk of preventable diseases and screening for fall risk and other medical concerns.    This visit is provided free of charge (no copay) for all Medicare recipients. The clinical pharmacists at [facility] have begun to conduct these Wellness Visits which will also include a thorough review of all your medications.    As you primary medical provider recommend that you make an appointment for your Annual Wellness Visit if you have not done so already this year.  You may set up this appointment before you leave today or you may call back ([phone]) and schedule an appointment.  Please make sure when you call that you mention that you are scheduling your Annual Wellness Visit with the clinical pharmacist so that the appointment may be made for the proper length of time.     Continue current medications. Continue good therapeutic lifestyle changes which include good diet and exercise. Fall precautions discussed with patient. If an FOBT was given today- please return it to our front desk. If you are over 58 years old - you may need Prevnar 74 or the adult Pneumonia vaccine.  **Flu shots are available--- please call and schedule a FLU-CLINIC appointment**  After your visit with [place] today you will receive a survey in the mail or online from [employer] regarding your care with [place]. Please take a moment to fill this out. Your feedback is very  important to [place] as you can help [place] better understand your patient needs as well as improve your experience and satisfaction. WE CARE ABOUT YOU!!!   The patient should continue to follow-up with the urologist, Dr. Rosana Hoes because of his prostate cancer. He should continue with his current treatment He should make every effort to not put himself for risk for falling He should consider getting some physical therapy as soon as the flu season dies down to help with strengthening specimen since he's been so sick during the winter months. In the meantime he should drink plenty of fluids and stay well hydrated Try cortisone 10 sparingly to the abdomen Use scent free fabric softeners detergents and soaps

## 2017-04-01 ENCOUNTER — Emergency Department (HOSPITAL_COMMUNITY)
Admission: EM | Admit: 2017-04-01 | Discharge: 2017-04-01 | Disposition: A | Discharge status: Home / Self Care | Payer: Medicare Other | Source: Home / Self Care | Attending: Emergency Medicine | Admitting: Emergency Medicine

## 2017-04-01 ENCOUNTER — Encounter (HOSPITAL_COMMUNITY): Payer: Self-pay | Admitting: Emergency Medicine

## 2017-04-01 ENCOUNTER — Telehealth: Payer: Self-pay | Admitting: Family Medicine

## 2017-04-01 DIAGNOSIS — R1033 Periumbilical pain: Secondary | ICD-10-CM

## 2017-04-01 DIAGNOSIS — R112 Nausea with vomiting, unspecified: Secondary | ICD-10-CM

## 2017-04-01 DIAGNOSIS — Z87891 Personal history of nicotine dependence: Secondary | ICD-10-CM | POA: Insufficient documentation

## 2017-04-01 DIAGNOSIS — I10 Essential (primary) hypertension: Secondary | ICD-10-CM

## 2017-04-01 DIAGNOSIS — K563 Gallstone ileus: Secondary | ICD-10-CM | POA: Diagnosis not present

## 2017-04-01 DIAGNOSIS — Z8546 Personal history of malignant neoplasm of prostate: Secondary | ICD-10-CM

## 2017-04-01 DIAGNOSIS — R1013 Epigastric pain: Secondary | ICD-10-CM | POA: Insufficient documentation

## 2017-04-01 DIAGNOSIS — Z79899 Other long term (current) drug therapy: Secondary | ICD-10-CM

## 2017-04-01 DIAGNOSIS — Z7982 Long term (current) use of aspirin: Secondary | ICD-10-CM | POA: Insufficient documentation

## 2017-04-01 DIAGNOSIS — R10814 Left lower quadrant abdominal tenderness: Secondary | ICD-10-CM | POA: Diagnosis not present

## 2017-04-01 DIAGNOSIS — E785 Hyperlipidemia, unspecified: Secondary | ICD-10-CM | POA: Diagnosis not present

## 2017-04-01 DIAGNOSIS — R404 Transient alteration of awareness: Secondary | ICD-10-CM | POA: Diagnosis not present

## 2017-04-01 DIAGNOSIS — K8021 Calculus of gallbladder without cholecystitis with obstruction: Secondary | ICD-10-CM | POA: Diagnosis not present

## 2017-04-01 DIAGNOSIS — N179 Acute kidney failure, unspecified: Secondary | ICD-10-CM | POA: Diagnosis not present

## 2017-04-01 DIAGNOSIS — R531 Weakness: Secondary | ICD-10-CM | POA: Diagnosis not present

## 2017-04-01 DIAGNOSIS — I4510 Unspecified right bundle-branch block: Secondary | ICD-10-CM | POA: Diagnosis not present

## 2017-04-01 LAB — COMPREHENSIVE METABOLIC PANEL
ALT: 13 U/L — ABNORMAL LOW (ref 17–63)
AST: 21 U/L (ref 15–41)
Albumin: 3.3 g/dL — ABNORMAL LOW (ref 3.5–5.0)
Alkaline Phosphatase: 65 U/L (ref 38–126)
Anion gap: 10 (ref 5–15)
BUN: 21 mg/dL — ABNORMAL HIGH (ref 6–20)
CO2: 30 mmol/L (ref 22–32)
Calcium: 9.3 mg/dL (ref 8.9–10.3)
Chloride: 99 mmol/L — ABNORMAL LOW (ref 101–111)
Creatinine, Ser: 1.06 mg/dL (ref 0.61–1.24)
GFR calc Af Amer: >60 mL/min (ref >60)
GFR calc non Af Amer: 59 mL/min — ABNORMAL LOW (ref >60)
Glucose, Bld: 154 mg/dL — ABNORMAL HIGH (ref 65–99)
Potassium: 3.5 mmol/L (ref 3.5–5.1)
Sodium: 139 mmol/L (ref 135–145)
Total Bilirubin: 0.8 mg/dL (ref 0.3–1.2)
Total Protein: 7.2 g/dL (ref 6.5–8.1)

## 2017-04-01 LAB — LIPASE, BLOOD: Lipase: 19 U/L (ref 11–51)

## 2017-04-01 LAB — CBC WITH DIFFERENTIAL/PLATELET
Basophils Absolute: 0 10*3/uL (ref 0.0–0.1)
Basophils Relative: 0 %
Eosinophils Absolute: 0 10*3/uL (ref 0.0–0.7)
Eosinophils Relative: 0 %
HCT: 42.4 % (ref 39.0–52.0)
Hemoglobin: 14.1 g/dL (ref 13.0–17.0)
Lymphocytes Relative: 5 %
Lymphs Abs: 0.8 10*3/uL (ref 0.7–4.0)
MCH: 31.1 pg (ref 26.0–34.0)
MCHC: 33.3 g/dL (ref 30.0–36.0)
MCV: 93.6 fL (ref 78.0–100.0)
Monocytes Absolute: 1.5 10*3/uL — ABNORMAL HIGH (ref 0.1–1.0)
Monocytes Relative: 9 %
Neutro Abs: 14.9 10*3/uL — ABNORMAL HIGH (ref 1.7–7.7)
Neutrophils Relative %: 86 %
Platelets: 292 10*3/uL (ref 150–400)
RBC: 4.53 MIL/uL (ref 4.22–5.81)
RDW: 13.6 % (ref 11.5–15.5)
WBC: 17.2 10*3/uL — ABNORMAL HIGH (ref 4.0–10.5)

## 2017-04-01 MED ORDER — PANTOPRAZOLE SODIUM 40 MG IV SOLR
40.0000 mg | Freq: Once | INTRAVENOUS | Status: AC
  Administered 2017-04-01: 40 mg via INTRAVENOUS
  Filled 2017-04-01: qty 40

## 2017-04-01 MED ORDER — ONDANSETRON HCL 4 MG PO TABS: 4.0000 mg | ORAL_TABLET | Freq: Three times a day (TID) | ORAL | 0 refills | Status: DC | PRN

## 2017-04-01 MED ORDER — PANTOPRAZOLE SODIUM 20 MG PO TBEC: 20.0000 mg | DELAYED_RELEASE_TABLET | Freq: Every day | ORAL | 0 refills | Status: DC

## 2017-04-01 MED ORDER — ONDANSETRON HCL 4 MG PO TABS: 4.0000 mg | ORAL_TABLET | Freq: Four times a day (QID) | ORAL | 0 refills | Status: DC

## 2017-04-01 MED ORDER — SODIUM CHLORIDE 0.9 % IV BOLUS (SEPSIS)
500.0000 mL | Freq: Once | INTRAVENOUS | Status: AC
  Administered 2017-04-01: 500 mL via INTRAVENOUS

## 2017-04-01 MED ORDER — ONDANSETRON HCL 4 MG/2ML IJ SOLN
4.0000 mg | Freq: Once | INTRAMUSCULAR | Status: AC
  Administered 2017-04-01: 4 mg via INTRAVENOUS
  Filled 2017-04-01: qty 2

## 2017-04-01 MED ORDER — ONDANSETRON 4 MG PREPACK (~~LOC~~): 1.0000 | ORAL_TABLET | Freq: Three times a day (TID) | ORAL | 0 refills | Status: DC | PRN

## 2017-04-01 NOTE — ED Provider Notes (Signed)
[facility] DEPT Provider Note   CSN: [id number] Arrival date & time: 04/01/17  1530  By signing my name below, I, [name], attest that this documentation has been prepared under the direction and in the presence of Virgel Manifold, MD. Electronically Signed: [name], ED Scribe. 04/01/17. 3:48 PM.  History   Chief Complaint Chief Complaint  Patient presents with  . Abdominal Pain   The history is provided by the patient, a caregiver and the spouse. No language interpreter was used.   HPI Comments: Alec Davis is a 83 y.o. male with a PMHx of BPH, HERD, HTN/HLD, and prostate cancer, who presents to the Emergency Department complaining of intermittent episodes of mild to moderate periumbilical abdominal discomfort beginning several weeks ago. He is not in pain while in the ED. He notes associated intermittent nausea and several episodes of emesis which is present every few days. Pt's caregiver and wife report that the contents of his vomitus have been darker brown and black and appear to look like coffee grounds. His pain is exacerbated with generalized movements. No noted treatments for his abdominal pain were tried prior to coming into the ED. No PSHx to the abdomen. His last bowel movement was yesterday and was normal at that time. Pt notes that his pain is not worse nocturnally. Pt denies any increased eructation; however, his wife notes that he has been making strange noises while belching and that he seems to take short gaspy breaths during this as well. He is not currently prescribed medication to control his GERD. Caregiver also mentions that he was dx'd w/ PNA in December (4 months ago) and since then he has not completely returned to his baseline following recovery. Denies urgency, frequency, hematuria, dysuria, difficulty urinating, diarrhea, constipation, or any other associated symptoms.   Past Medical History:  Diagnosis Date  . BPH (benign prostatic  hyperplasia)   . Cataract   . GERD (gastroesophageal reflux disease)   . Hyperlipidemia   . Hypertension   . PROSTATE CANCER 01/19/2011   Qualifier: Diagnosis of  By: Nils Pyle CMA ([place]), [name]    . Vitamin D deficiency    Patient Active Problem List   Diagnosis Date Noted  . Heart block 12/18/2016  . Second degree AV block, Mobitz type I 12/17/2016  . Abdominal pain, epigastric 12/17/2016  . Mobitz type 1 second degree atrioventricular block   . Hyperlipemia 08/03/2013  . Depression 08/03/2013  . Vitamin D deficiency 03/07/2011  . GERD (gastroesophageal reflux disease) 03/07/2011  . Prostate cancer ([place]) 01/19/2011  . Essential hypertension 01/19/2011  . Right bundle branch block 01/19/2011  . INTERNAL HEMORRHOIDS 01/19/2011  . DIVERTICULOSIS, COLON 01/19/2011  . COLONIC POLYPS, HX OF 01/19/2011   Past Surgical History:  Procedure Laterality Date  . cataracts  Bilateral    Dr [name]  . SPINE SURGERY      Home Medications    Prior to Admission medications   Medication Sig Start Date End Date Taking? Authorizing Provider  aspirin 81 MG EC tablet Take 81 mg by mouth daily after supper.      Historical Provider, MD  Calcium Carb-Cholecalciferol (CALCIUM 600 + D PO) Take 1 tablet by mouth daily.    Historical Provider, MD  lovastatin (MEVACOR) 20 MG tablet TAKE 1 TABLET DAILY Patient taking differently: Take 20 mg by mouth daily 11/19/16   Chipper Herb, MD  ondansetron (ZOFRAN) 4 MG tablet Take 1 tablet (4 mg total) by mouth every 8 (eight)  hours as needed for nausea or vomiting. 04/01/17   Mary-Margaret Hassell Done, FNP  tamsulosin (FLOMAX) 0.4 MG CAPS capsule Take 1 capsule (0.4 mg total) by mouth daily. 12/05/14   Chipper Herb, MD  Vitamin D, Cholecalciferol, 1000 UNITS TABS Take 1,000 Units by mouth daily.     Historical Provider, MD   Family History Family History  Problem Relation Age of Onset  . Alzheimer's disease Brother    Social History Social History  Substance  Use Topics  . Smoking status: Former Smoker    Quit date: 01/25/1950  . Smokeless tobacco: Never Used  . Alcohol use No   Allergies   Patient has no known allergies.  Review of Systems Review of Systems  Gastrointestinal: Positive for abdominal pain, nausea and vomiting. Negative for constipation and diarrhea.  Genitourinary: Negative for difficulty urinating, dysuria, frequency, hematuria and urgency.  All other systems reviewed and are negative.  Physical Exam Updated Vital Signs BP (!) 149/94 (BP Location: Left Arm)   Pulse 85   Temp 97.4 F (36.3 C) (Oral)   Resp 20   Ht 6\' 2"  (1.88 m)   Wt 180 lb (81.6 kg)   SpO2 95%   BMI 23.11 kg/m   Physical Exam  Constitutional: He appears well-developed and well-nourished.  HENT:  Head: Normocephalic.  Right Ear: External ear normal.  Left Ear: External ear normal.  Nose: Nose normal.  Eyes: Conjunctivae are normal. Right eye exhibits no discharge. Left eye exhibits no discharge.  Neck: Normal range of motion.  Cardiovascular: Normal rate, regular rhythm and normal heart sounds.   No murmur heard. Pulmonary/Chest: Effort normal and breath sounds normal. No respiratory distress. He has no wheezes. He has no rales.  Abdominal: Soft. There is tenderness. There is no rebound and no guarding.  Periumbilical and epigastric tenderness.   Musculoskeletal: Normal range of motion. He exhibits no edema or tenderness.  Neurological: He is alert. No cranial nerve deficit. Coordination normal.  Skin: Skin is warm and dry. No rash noted. No erythema. No pallor.  Psychiatric: He has a normal mood and affect. His behavior is normal.  Nursing note and vitals reviewed.  ED Treatments / Results  DIAGNOSTIC STUDIES: Oxygen Saturation is 95% on RA, adequate by my interpretation.   COORDINATION OF CARE: 3:48 PM-Discussed next steps with pt. Pt verbalized understanding and is agreeable with the plan.   Labs (all labs ordered are listed, but  only abnormal results are displayed) Labs Reviewed  COMPREHENSIVE METABOLIC PANEL - Abnormal; Notable for the following:       Result Value   Chloride 99 (*)    Glucose, Bld 154 (*)    BUN 21 (*)    Albumin 3.3 (*)    ALT 13 (*)    GFR calc non Af Amer 59 (*)    All other components within normal limits  CBC WITH DIFFERENTIAL/PLATELET - Abnormal; Notable for the following:    WBC 17.2 (*)    Neutro Abs 14.9 (*)    Monocytes Absolute 1.5 (*)    All other components within normal limits  LIPASE, BLOOD    EKG  EKG Interpretation None      Radiology No results found.  Procedures Procedures   Medications Ordered in ED Medications - No data to display  Initial Impression / Assessment and Plan / ED Course  I have reviewed the triage vital signs and the nursing notes.  Pertinent labs & imaging results that were available during my  care of the patient were reviewed by me and considered in my medical decision making (see chart for details).     89yM with intermittent abdominal pain and n/v for weeks. Emesis sometimes dark and daughter used term "coffee grounds." Has been generally weak for months after pneumonia which I suspect is probably mostly deconditioning. Mild epigastric/periumbilical pain on exam. No peritonitis. Not distended. Suspect this may be PUD/gastritis. H/H ok. HD stable. Afebrile. Generally well appearing, especially for his age. On PPI. Will have increase to BID. Advised pt/family to follow-up with GI. May need endoscopy.   It has been determined that no acute conditions requiring further emergency intervention are present at this time. The patient has been advised of the diagnosis and plan. I reviewed any labs and imaging including any potential incidental findings. We have discussed signs and symptoms that warrant return to the ED and they are listed in the discharge instructions.    Final Clinical Impressions(s) / ED Diagnoses   Final diagnoses:    Periumbilical abdominal pain   New Prescriptions New Prescriptions   No medications on file   I personally preformed the services scribed in my presence. The recorded information has been reviewed is accurate. Virgel Manifold, MD.     Virgel Manifold, MD 04/08/17 [phone]

## 2017-04-01 NOTE — Telephone Encounter (Signed)
What symptoms do you have? Vomiting, not eating, and been in the bed for 3 days.  How long have you been sick? 3 days  Have you been seen for this problem? no  If your provider decides to give you a prescription, which pharmacy would you like for it to be sent to? [facility]   Patient informed that this information will be sent to the clinical staff for review and that they should receive a follow up call.

## 2017-04-01 NOTE — ED Triage Notes (Signed)
ABD pain in the RLQ and LLQ starting a week ago. N/V. No temperature. Last bowel movement was yesterday

## 2017-04-02 ENCOUNTER — Encounter: Payer: Self-pay | Admitting: Nurse Practitioner

## 2017-04-02 ENCOUNTER — Telehealth: Payer: Self-pay | Admitting: Family Medicine

## 2017-04-02 ENCOUNTER — Encounter (HOSPITAL_COMMUNITY): Payer: Self-pay | Admitting: *Deleted

## 2017-04-02 ENCOUNTER — Ambulatory Visit (HOSPITAL_COMMUNITY)
Admission: RE | Admit: 2017-04-02 | Discharge: 2017-04-02 | Disposition: A | Discharge status: Home / Self Care | Payer: Medicare Other | Source: Ambulatory Visit | Attending: Nurse Practitioner | Admitting: Nurse Practitioner

## 2017-04-02 ENCOUNTER — Inpatient Hospital Stay (HOSPITAL_COMMUNITY)
Admission: EM | Admit: 2017-04-02 | Discharge: 2017-04-12 | DRG: 330 | Disposition: A | Discharge status: Skilled Nursing Facility | Payer: Medicare Other | Attending: Surgery | Admitting: Surgery

## 2017-04-02 DIAGNOSIS — I2510 Atherosclerotic heart disease of native coronary artery without angina pectoris: Secondary | ICD-10-CM | POA: Diagnosis present

## 2017-04-02 DIAGNOSIS — Z0189 Encounter for other specified special examinations: Secondary | ICD-10-CM

## 2017-04-02 DIAGNOSIS — R911 Solitary pulmonary nodule: Secondary | ICD-10-CM | POA: Insufficient documentation

## 2017-04-02 DIAGNOSIS — Z4659 Encounter for fitting and adjustment of other gastrointestinal appliance and device: Secondary | ICD-10-CM

## 2017-04-02 DIAGNOSIS — J029 Acute pharyngitis, unspecified: Secondary | ICD-10-CM | POA: Diagnosis not present

## 2017-04-02 DIAGNOSIS — I4510 Unspecified right bundle-branch block: Secondary | ICD-10-CM | POA: Diagnosis present

## 2017-04-02 DIAGNOSIS — R112 Nausea with vomiting, unspecified: Secondary | ICD-10-CM

## 2017-04-02 DIAGNOSIS — N400 Enlarged prostate without lower urinary tract symptoms: Secondary | ICD-10-CM | POA: Diagnosis present

## 2017-04-02 DIAGNOSIS — Z79899 Other long term (current) drug therapy: Secondary | ICD-10-CM

## 2017-04-02 DIAGNOSIS — N179 Acute kidney failure, unspecified: Secondary | ICD-10-CM | POA: Diagnosis present

## 2017-04-02 DIAGNOSIS — R1031 Right lower quadrant pain: Secondary | ICD-10-CM | POA: Insufficient documentation

## 2017-04-02 DIAGNOSIS — K563 Gallstone ileus: Secondary | ICD-10-CM | POA: Insufficient documentation

## 2017-04-02 DIAGNOSIS — K8021 Calculus of gallbladder without cholecystitis with obstruction: Secondary | ICD-10-CM | POA: Diagnosis present

## 2017-04-02 DIAGNOSIS — K219 Gastro-esophageal reflux disease without esophagitis: Secondary | ICD-10-CM | POA: Diagnosis present

## 2017-04-02 DIAGNOSIS — E785 Hyperlipidemia, unspecified: Secondary | ICD-10-CM | POA: Diagnosis present

## 2017-04-02 DIAGNOSIS — Z87891 Personal history of nicotine dependence: Secondary | ICD-10-CM

## 2017-04-02 DIAGNOSIS — I10 Essential (primary) hypertension: Secondary | ICD-10-CM | POA: Diagnosis present

## 2017-04-02 DIAGNOSIS — Z7982 Long term (current) use of aspirin: Secondary | ICD-10-CM

## 2017-04-02 DIAGNOSIS — R410 Disorientation, unspecified: Secondary | ICD-10-CM | POA: Diagnosis not present

## 2017-04-02 DIAGNOSIS — Z8546 Personal history of malignant neoplasm of prostate: Secondary | ICD-10-CM

## 2017-04-02 DIAGNOSIS — I441 Atrioventricular block, second degree: Secondary | ICD-10-CM | POA: Diagnosis present

## 2017-04-02 LAB — CBC
HEMATOCRIT: 44.2 % (ref 39.0–52.0)
HEMOGLOBIN: 14.5 g/dL (ref 13.0–17.0)
MCH: 30.9 pg (ref 26.0–34.0)
MCHC: 32.8 g/dL (ref 30.0–36.0)
MCV: 94 fL (ref 78.0–100.0)
Platelets: 371 10*3/uL (ref 150–400)
RBC: 4.7 MIL/uL (ref 4.22–5.81)
RDW: 13.6 % (ref 11.5–15.5)
WBC: 19.8 10*3/uL — AB (ref 4.0–10.5)

## 2017-04-02 MED ORDER — IOPAMIDOL (ISOVUE-300) INJECTION 61%
100.0000 mL | Freq: Once | INTRAVENOUS | Status: AC | PRN
  Administered 2017-04-02: 100 mL via INTRAVENOUS

## 2017-04-02 MED ORDER — IOPAMIDOL (ISOVUE-300) INJECTION 61%
INTRAVENOUS | Status: AC
  Administered 2017-04-02: 30 mL via ORAL
  Filled 2017-04-02: qty 30

## 2017-04-02 NOTE — Telephone Encounter (Signed)
Notified daughter of appt. She is concerned that the appt is too far out. Will talk with referrals and see what can be done

## 2017-04-02 NOTE — ED Provider Notes (Signed)
[facility] DEPT Provider Note   CSN: [id number] Arrival date & time: 04/02/17  2305  By signing my name below, I, Oleh Genin, attest that this documentation has been prepared under the direction and in the presence of Ripley Fraise, MD. Electronically Signed: Oleh Genin, Scribe. 04/03/17. 12:08 AM.   History   Chief Complaint Chief Complaint  Patient presents with  . Abdominal Pain    HPI Alec Davis is a 83 y.o. male with history of HTN, HLD, and GERD who presents to the ED for evaluation of abdominal pain. This patient states that in the last 4 days he has experienced frequent vomiting with epigastric pain. Last bowel movement 48 hours ago. He was seen at [facility] several hours ago where he received a CT abdomen that demonstrated an obstructing 2.2cm gallstone in the distal ileum that eroded through the gallbladder wall. Transferred to this facility. He is denying any chest pain, fever, back pain, or syncope. No urinary complaints including dysuria and hematuria.  The history is provided by the patient. No language interpreter was used.  Abdominal Pain   This is a new problem. The current episode started more than 2 days ago. The problem occurs constantly. The problem has not changed since onset.The pain is associated with an unknown factor. The pain is located in the epigastric region. The pain is moderate. Associated symptoms include vomiting and constipation. Pertinent negatives include fever, hematochezia, dysuria and hematuria. Past workup includes CT scan.    Past Medical History:  Diagnosis Date  . BPH (benign prostatic hyperplasia)   . Cataract   . GERD (gastroesophageal reflux disease)   . Hyperlipidemia   . Hypertension   . PROSTATE CANCER 01/19/2011   Qualifier: Diagnosis of  By: Nils Pyle CMA ([place]), [name]    . Vitamin D deficiency     Patient Active Problem List   Diagnosis Date Noted  . Heart block 12/18/2016  . Second degree AV block,  Mobitz type I 12/17/2016  . Abdominal pain, epigastric 12/17/2016  . Mobitz type 1 second degree atrioventricular block   . Hyperlipemia 08/03/2013  . Depression 08/03/2013  . Vitamin D deficiency 03/07/2011  . GERD (gastroesophageal reflux disease) 03/07/2011  . Prostate cancer ([place]) 01/19/2011  . Essential hypertension 01/19/2011  . Right bundle branch block 01/19/2011  . INTERNAL HEMORRHOIDS 01/19/2011  . DIVERTICULOSIS, COLON 01/19/2011  . COLONIC POLYPS, HX OF 01/19/2011    Past Surgical History:  Procedure Laterality Date  . cataracts  Bilateral    Dr [name]  . SPINE SURGERY         Home Medications    Prior to Admission medications   Medication Sig Start Date End Date Taking? Authorizing Provider  aspirin 81 MG EC tablet Take 81 mg by mouth daily after supper.      Historical Provider, MD  Calcium Carb-Cholecalciferol (CALCIUM 600 + D PO) Take 1 tablet by mouth daily.    Historical Provider, MD  lovastatin (MEVACOR) 20 MG tablet TAKE 1 TABLET DAILY Patient taking differently: Take 20 mg by mouth daily 11/19/16   Chipper Herb, MD  ondansetron (ZOFRAN) 4 MG tablet Take 1 tablet (4 mg total) by mouth every 8 (eight) hours as needed for nausea or vomiting. 04/01/17   Mary-Margaret Hassell Done, FNP  ondansetron (ZOFRAN) 4 MG tablet Take 1 tablet (4 mg total) by mouth every 6 (six) hours. 04/01/17   Virgel Manifold, MD  ondansetron (ZOFRAN) 4 mg TABS tablet Take 4 tablets by mouth every 8 (  eight) hours as needed. 04/01/17   Virgel Manifold, MD  pantoprazole (PROTONIX) 20 MG tablet Take 1 tablet (20 mg total) by mouth daily. 04/01/17   Virgel Manifold, MD  tamsulosin (FLOMAX) 0.4 MG CAPS capsule Take 1 capsule (0.4 mg total) by mouth daily. 12/05/14   Chipper Herb, MD  Vitamin D, Cholecalciferol, 1000 UNITS TABS Take 1,000 Units by mouth daily.     Historical Provider, MD    Family History Family History  Problem Relation Age of Onset  . Alzheimer's disease Brother     Social  History Social History  Substance Use Topics  . Smoking status: Former Smoker    Quit date: 01/25/1950  . Smokeless tobacco: Never Used  . Alcohol use No     Allergies   Patient has no known allergies.   Review of Systems Review of Systems  Constitutional: Negative for fever.  Respiratory: Negative for shortness of breath.   Cardiovascular: Negative for chest pain.  Gastrointestinal: Positive for abdominal pain, constipation and vomiting. Negative for hematochezia.  Genitourinary: Negative for dysuria and hematuria.  All other systems reviewed and are negative.    Physical Exam Updated Vital Signs BP 132/78 (BP Location: Left Arm)   Pulse 100   Temp 97.9 F (36.6 C) (Oral)   Resp 16   SpO2 94%   Physical Exam  CONSTITUTIONAL: Elderly and frail HEAD: Normocephalic/atraumatic EYES: EOMI/PERRL ENMT: Mucous membranes dry NECK: supple no meningeal signs SPINE/BACK:entire spine nontender CV: S1/S2 noted LUNGS: Lungs are clear to auscultation bilaterally, no apparent distress ABDOMEN: soft, moderate RLQ and LLQ tenderness, decreased bowel sounds throughout, no rebound or guarding GU:no cva tenderness NEURO: Pt is awake/alert/appropriate, moves all extremitiesx4.  No facial droop.   EXTREMITIES: pulses normal/equal, full ROM SKIN: warm, color normal PSYCH: no abnormalities of mood noted, alert and oriented to situation   ED Treatments / Results  Labs (all labs ordered are listed, but only abnormal results are displayed) Labs Reviewed  COMPREHENSIVE METABOLIC PANEL - Abnormal; Notable for the following:       Result Value   Chloride 100 (*)    Glucose, Bld 167 (*)    BUN 32 (*)    Creatinine, Ser 1.54 (*)    GFR calc non Af Amer 38 (*)    GFR calc Af Amer 44 (*)    All other components within normal limits  CBC - Abnormal; Notable for the following:    WBC 19.8 (*)    All other components within normal limits  LIPASE, BLOOD  URINALYSIS, ROUTINE W REFLEX  MICROSCOPIC  TYPE AND SCREEN  ABO/RH    EKG  EKG Interpretation  Date/Time:  Saturday April 03 2017 00:20:37 EDT Ventricular Rate:  86 PR Interval:    QRS Duration: 138 QT Interval:  413 QTC Calculation: 494 R Axis:   78 Text Interpretation:  Sinus rhythm Atrial premature complexes Prolonged PR interval Right bundle branch block No previous ECGs available Confirmed by Christy Gentles  MD, Bowman Higbie ([id number]) on 04/03/2017 12:30:47 AM       Radiology Ct Abdomen Pelvis W Contrast  Addendum Date: 04/02/2017   ADDENDUM REPORT: 04/02/2017 22:55 ADDENDUM: Critical Value/emergent results were called by telephone at the time of interpretation on 04/02/2017 at 9:15 pm to [facility] , who verbally acknowledged these results. Electronically Signed   By: Ashley Royalty M.D.   On: 04/02/2017 22:55   Result Date: 04/02/2017 CLINICAL DATA:  Umbilical pain radiating to the mid chest intermittently x1 week. EXAM:  CT ABDOMEN AND PELVIS WITH CONTRAST TECHNIQUE: Multidetector CT imaging of the abdomen and pelvis was performed using the standard protocol following bolus administration of intravenous contrast. CONTRAST:  118mL ISOVUE-300 IOPAMIDOL (ISOVUE-300) INJECTION 61%, 89mL ISOVUE-300 IOPAMIDOL (ISOVUE-300) INJECTION 61% COMPARISON:  KUB from 12/17/2016 FINDINGS: Lower chest: Normal size cardiac chambers with three-vessel coronary arteriosclerosis. No pericardial effusion. 7 mm partially calcified left lower lobe pulmonary nodule may represent a small granuloma or hemorrhage thymoma. Scarring is seen medially at the right lung base. Hepatobiliary: Gas is noted within a contracted thick-walled gallbladder lumen with fistulous connection to the second portion of the duodenum. Surrounding inflammatory change is noted. There is fluid-filled distention of small bowel secondary to a gallstone ileus with a gallstone seen within the distal ileum, situated in the right hemipelvis. The stone measures approximately 2.2 cm  in diameter and is lamellated in appearance. Pancreas: Marked atrophy of the pancreas. Spleen: Normal in size without focal abnormality. Adrenals/Urinary Tract: There is a 1.7 x 1 6 cm mixed attenuating nodule. The right adrenal gland is normal. Bilateral renal cysts are present without obstructive uropathy. No solid enhancing mass, nephrolithiasis nor hydroureteronephrosis. Stomach/Bowel: Obstructing gallstone in the distal ileum causing fluid-filled distention of more proximal small bowel measuring up to 4.4 cm. Distal to this stone, the ileum is decompressed. Large bowel is unremarkable apart from scattered colonic diverticulosis. Vascular/Lymphatic: Aortoiliac and branch vessel atherosclerosis without aneurysm. No lymphadenopathy. Reproductive: The prostate contains radiation seeds and is mildly enlarged. Other: No abdominal wall hernia or abnormality. No abdominopelvic ascites. Musculoskeletal: Thoracolumbar spondylosis. No acute nor suspicious osseous abnormalities. IMPRESSION: 1. There is an obstructing 2.2 cm gallstone in the distal ileum which appears to have eroded through the gallbladder wall and second portion of the duodenum with resultant fistulous connection seen. This is causing a "gallstone ileus" of small bowel loops leading up to the obstructing stone seen in the right lower hemipelvis. 2. There is a heterogeneously enhancing 1.7 x 1.6 cm left adrenal nodule which can be further worked up with CT or MRI without and with IV contrast. 3. Aortoiliac and branch vessel atherosclerosis without aneurysm. Three-vessel coronary arteriosclerosis is noted. 4. A 7 mm left lower lobe partially calcified nodule is noted possibly a granuloma or potentially hamartoma. Electronically Signed: By: Ashley Royalty M.D. On: 04/02/2017 21:35    Procedures Procedures   Medications Ordered in ED Medications - No data to display   Initial Impression / Assessment and Plan / ED Course  I have reviewed the triage  vital signs and the nursing notes.  Pertinent labs  results that were available during my care of the patient were reviewed by me and considered in my medical decision making (see chart for details).     12:43 AM Pt stable Outpatient CT reveals gallstone ileus I have consulted general surgery dr Cher Nakai She is on OR and will see patient in the next hour Patient/family updated Pt declines pain meds 2:59 AM Pt to be admitted by general surgery BP 133/60   Pulse 89   Temp 97.9 F (36.6 C) (Oral)   Resp 18   SpO2 96%   Final Clinical Impressions(s) / ED Diagnoses   Final diagnoses:  Gallstone ileus ([place])    New Prescriptions New Prescriptions   No medications on file  I personally performed the services described in this documentation, which was scribed in my presence. The recorded information has been reviewed and is accurate.       Ripley Fraise, MD 04/03/17  0300  

## 2017-04-02 NOTE — Telephone Encounter (Signed)
Spoke with pt's wife Due to continued sxs and elevated WBC STAT CT ordered Order entered in [employer]

## 2017-04-02 NOTE — Telephone Encounter (Signed)
Spoke with daughter [name] from [place]. Patient had been vomiting since Tuesday and yesterday he wouldn't eat anything so they took him to [facility] ER. [name] states they gave him fluids and told him it was either a hernia or acid reflux. Told to follow up wit PCP to get a GI referral. [name] is requesting a stat GI referral since her mother does not drive and her brother had to drive in from [place] to be with patient. Advised that patient does need a hospital follow up appointment. [name] verbalizes understanding and states that if we can just do the stat GI referral she will arrange arrangements to have someone bring in patient for hospital follow up the next week or so. Covering PCP, please advise.

## 2017-04-02 NOTE — ED Triage Notes (Signed)
Pt has been having NV since Tuesday with poor PO intake and abd pain. Pt was sent to [facility] to have a CT scan. Results showing "gallbladder ileus and small bowel obstruction." Pt c/o abdominal soreness

## 2017-04-02 NOTE — Telephone Encounter (Signed)
Referral made to GI as orient

## 2017-04-03 ENCOUNTER — Inpatient Hospital Stay (HOSPITAL_COMMUNITY): Payer: Medicare Other

## 2017-04-03 ENCOUNTER — Encounter (HOSPITAL_COMMUNITY): Payer: Self-pay | Admitting: Certified Registered Nurse Anesthetist

## 2017-04-03 ENCOUNTER — Encounter (HOSPITAL_COMMUNITY): Admission: EM | Disposition: A | Discharge status: Skilled Nursing Facility | Payer: Self-pay | Source: Home / Self Care

## 2017-04-03 ENCOUNTER — Inpatient Hospital Stay (HOSPITAL_COMMUNITY): Payer: Medicare Other | Admitting: Certified Registered Nurse Anesthetist

## 2017-04-03 DIAGNOSIS — K563 Gallstone ileus: Secondary | ICD-10-CM | POA: Diagnosis present

## 2017-04-03 DIAGNOSIS — R110 Nausea: Secondary | ICD-10-CM | POA: Diagnosis not present

## 2017-04-03 DIAGNOSIS — Z79899 Other long term (current) drug therapy: Secondary | ICD-10-CM | POA: Diagnosis not present

## 2017-04-03 DIAGNOSIS — N400 Enlarged prostate without lower urinary tract symptoms: Secondary | ICD-10-CM | POA: Diagnosis present

## 2017-04-03 DIAGNOSIS — I10 Essential (primary) hypertension: Secondary | ICD-10-CM | POA: Diagnosis present

## 2017-04-03 DIAGNOSIS — R262 Difficulty in walking, not elsewhere classified: Secondary | ICD-10-CM | POA: Diagnosis not present

## 2017-04-03 DIAGNOSIS — K219 Gastro-esophageal reflux disease without esophagitis: Secondary | ICD-10-CM | POA: Diagnosis present

## 2017-04-03 DIAGNOSIS — Z48815 Encounter for surgical aftercare following surgery on the digestive system: Secondary | ICD-10-CM | POA: Diagnosis not present

## 2017-04-03 DIAGNOSIS — R41841 Cognitive communication deficit: Secondary | ICD-10-CM | POA: Diagnosis not present

## 2017-04-03 DIAGNOSIS — Z8546 Personal history of malignant neoplasm of prostate: Secondary | ICD-10-CM | POA: Diagnosis not present

## 2017-04-03 DIAGNOSIS — F329 Major depressive disorder, single episode, unspecified: Secondary | ICD-10-CM | POA: Diagnosis not present

## 2017-04-03 DIAGNOSIS — R278 Other lack of coordination: Secondary | ICD-10-CM | POA: Diagnosis not present

## 2017-04-03 DIAGNOSIS — K5900 Constipation, unspecified: Secondary | ICD-10-CM | POA: Diagnosis not present

## 2017-04-03 DIAGNOSIS — J029 Acute pharyngitis, unspecified: Secondary | ICD-10-CM | POA: Diagnosis not present

## 2017-04-03 DIAGNOSIS — E559 Vitamin D deficiency, unspecified: Secondary | ICD-10-CM | POA: Diagnosis not present

## 2017-04-03 DIAGNOSIS — Z4682 Encounter for fitting and adjustment of non-vascular catheter: Secondary | ICD-10-CM | POA: Diagnosis not present

## 2017-04-03 DIAGNOSIS — I4510 Unspecified right bundle-branch block: Secondary | ICD-10-CM | POA: Diagnosis present

## 2017-04-03 DIAGNOSIS — R911 Solitary pulmonary nodule: Secondary | ICD-10-CM | POA: Diagnosis present

## 2017-04-03 DIAGNOSIS — K5780 Diverticulitis of intestine, part unspecified, with perforation and abscess without bleeding: Secondary | ICD-10-CM | POA: Diagnosis not present

## 2017-04-03 DIAGNOSIS — R1312 Dysphagia, oropharyngeal phase: Secondary | ICD-10-CM | POA: Diagnosis not present

## 2017-04-03 DIAGNOSIS — R410 Disorientation, unspecified: Secondary | ICD-10-CM | POA: Diagnosis not present

## 2017-04-03 DIAGNOSIS — N179 Acute kidney failure, unspecified: Secondary | ICD-10-CM | POA: Diagnosis present

## 2017-04-03 DIAGNOSIS — R52 Pain, unspecified: Secondary | ICD-10-CM | POA: Diagnosis not present

## 2017-04-03 DIAGNOSIS — Z87891 Personal history of nicotine dependence: Secondary | ICD-10-CM | POA: Diagnosis not present

## 2017-04-03 DIAGNOSIS — M6281 Muscle weakness (generalized): Secondary | ICD-10-CM | POA: Diagnosis not present

## 2017-04-03 DIAGNOSIS — K8021 Calculus of gallbladder without cholecystitis with obstruction: Secondary | ICD-10-CM | POA: Diagnosis present

## 2017-04-03 DIAGNOSIS — K648 Other hemorrhoids: Secondary | ICD-10-CM | POA: Diagnosis not present

## 2017-04-03 DIAGNOSIS — E785 Hyperlipidemia, unspecified: Secondary | ICD-10-CM | POA: Diagnosis present

## 2017-04-03 DIAGNOSIS — K56600 Partial intestinal obstruction, unspecified as to cause: Secondary | ICD-10-CM | POA: Diagnosis not present

## 2017-04-03 DIAGNOSIS — I2510 Atherosclerotic heart disease of native coronary artery without angina pectoris: Secondary | ICD-10-CM | POA: Diagnosis present

## 2017-04-03 DIAGNOSIS — I441 Atrioventricular block, second degree: Secondary | ICD-10-CM | POA: Diagnosis present

## 2017-04-03 DIAGNOSIS — Z7982 Long term (current) use of aspirin: Secondary | ICD-10-CM | POA: Diagnosis not present

## 2017-04-03 HISTORY — PX: LAPAROTOMY: SHX154

## 2017-04-03 LAB — BASIC METABOLIC PANEL
ANION GAP: 9 (ref 5–15)
BUN: 30 mg/dL — ABNORMAL HIGH (ref 6–20)
CALCIUM: 9 mg/dL (ref 8.9–10.3)
CO2: 30 mmol/L (ref 22–32)
Chloride: 101 mmol/L (ref 101–111)
Creatinine, Ser: 1.22 mg/dL (ref 0.61–1.24)
GFR calc non Af Amer: 50 mL/min — ABNORMAL LOW (ref >60)
GFR, EST AFRICAN AMERICAN: 58 mL/min — AB (ref >60)
Glucose, Bld: 155 mg/dL — ABNORMAL HIGH (ref 65–99)
POTASSIUM: 3.4 mmol/L — AB (ref 3.5–5.1)
Sodium: 140 mmol/L (ref 135–145)

## 2017-04-03 LAB — URINALYSIS, ROUTINE W REFLEX MICROSCOPIC
BILIRUBIN URINE: NEGATIVE
Glucose, UA: NEGATIVE mg/dL
Hgb urine dipstick: NEGATIVE
KETONES UR: NEGATIVE mg/dL
LEUKOCYTES UA: NEGATIVE
NITRITE: NEGATIVE
Protein, ur: NEGATIVE mg/dL
Specific Gravity, Urine: >1.046 — ABNORMAL HIGH (ref 1.005–1.030)
pH: 5 (ref 5.0–8.0)

## 2017-04-03 LAB — SURGICAL PCR SCREEN
MRSA, PCR: NEGATIVE
Staphylococcus aureus: NEGATIVE

## 2017-04-03 LAB — CBC
HEMATOCRIT: 41.9 % (ref 39.0–52.0)
HEMOGLOBIN: 13.7 g/dL (ref 13.0–17.0)
MCH: 30.6 pg (ref 26.0–34.0)
MCHC: 32.7 g/dL (ref 30.0–36.0)
MCV: 93.7 fL (ref 78.0–100.0)
Platelets: 308 10*3/uL (ref 150–400)
RBC: 4.47 MIL/uL (ref 4.22–5.81)
RDW: 13.7 % (ref 11.5–15.5)
WBC: 15.6 10*3/uL — ABNORMAL HIGH (ref 4.0–10.5)

## 2017-04-03 LAB — COMPREHENSIVE METABOLIC PANEL
ALK PHOS: 67 U/L (ref 38–126)
ALT: 18 U/L (ref 17–63)
ANION GAP: 13 (ref 5–15)
AST: 30 U/L (ref 15–41)
Albumin: 3.6 g/dL (ref 3.5–5.0)
BILIRUBIN TOTAL: 1 mg/dL (ref 0.3–1.2)
BUN: 32 mg/dL — ABNORMAL HIGH (ref 6–20)
CALCIUM: 9.5 mg/dL (ref 8.9–10.3)
CO2: 26 mmol/L (ref 22–32)
CREATININE: 1.54 mg/dL — AB (ref 0.61–1.24)
Chloride: 100 mmol/L — ABNORMAL LOW (ref 101–111)
GFR, EST AFRICAN AMERICAN: 44 mL/min — AB (ref >60)
GFR, EST NON AFRICAN AMERICAN: 38 mL/min — AB (ref >60)
Glucose, Bld: 167 mg/dL — ABNORMAL HIGH (ref 65–99)
Potassium: 3.5 mmol/L (ref 3.5–5.1)
SODIUM: 139 mmol/L (ref 135–145)
Total Protein: 7.7 g/dL (ref 6.5–8.1)

## 2017-04-03 LAB — LIPASE, BLOOD: Lipase: 15 U/L (ref 11–51)

## 2017-04-03 LAB — ABO/RH: ABO/RH(D): O POS

## 2017-04-03 LAB — PHOSPHORUS: PHOSPHORUS: 3.3 mg/dL (ref 2.5–4.6)

## 2017-04-03 LAB — TYPE AND SCREEN
ABO/RH(D): O POS
ANTIBODY SCREEN: NEGATIVE

## 2017-04-03 LAB — MAGNESIUM: Magnesium: 2.1 mg/dL (ref 1.7–2.4)

## 2017-04-03 SURGERY — LAPAROTOMY, EXPLORATORY
Anesthesia: General | Site: Abdomen

## 2017-04-03 MED ORDER — OXYCODONE HCL 5 MG PO TABS: 5.0000 mg | ORAL_TABLET | Freq: Once | ORAL | Status: DC | PRN

## 2017-04-03 MED ORDER — ONDANSETRON HCL 4 MG/2ML IJ SOLN
4.0000 mg | Freq: Four times a day (QID) | INTRAMUSCULAR | Status: DC | PRN
Start: 2017-04-03 — End: 2017-04-12
  Administered 2017-04-11: 4 mg via INTRAVENOUS
  Filled 2017-04-03: qty 2

## 2017-04-03 MED ORDER — ACETAMINOPHEN 325 MG PO TABS
650.0000 mg | ORAL_TABLET | Freq: Four times a day (QID) | ORAL | Status: DC | PRN
  Administered 2017-04-08: 650 mg via ORAL
  Filled 2017-04-03: qty 2

## 2017-04-03 MED ORDER — ROCURONIUM BROMIDE 10 MG/ML (PF) SYRINGE
PREFILLED_SYRINGE | INTRAVENOUS | Status: DC | PRN
  Administered 2017-04-03: 30 mg via INTRAVENOUS

## 2017-04-03 MED ORDER — DIPHENHYDRAMINE HCL 12.5 MG/5ML PO ELIX
12.5000 mg | ORAL_SOLUTION | Freq: Four times a day (QID) | ORAL | Status: DC | PRN
  Administered 2017-04-10 – 2017-04-11 (×2): 12.5 mg via ORAL
  Filled 2017-04-03 (×2): qty 10

## 2017-04-03 MED ORDER — SUCCINYLCHOLINE CHLORIDE 200 MG/10ML IV SOSY
PREFILLED_SYRINGE | INTRAVENOUS | Status: DC | PRN
  Administered 2017-04-03: 70 mg via INTRAVENOUS

## 2017-04-03 MED ORDER — FENTANYL CITRATE (PF) 100 MCG/2ML IJ SOLN
INTRAMUSCULAR | Status: AC
  Administered 2017-04-03: 25 ug via INTRAVENOUS
  Filled 2017-04-03: qty 2

## 2017-04-03 MED ORDER — LACTATED RINGERS IV BOLUS (SEPSIS)
500.0000 mL | Freq: Once | INTRAVENOUS | Status: AC
  Administered 2017-04-03: 500 mL via INTRAVENOUS

## 2017-04-03 MED ORDER — ACETAMINOPHEN 650 MG RE SUPP: 650.0000 mg | Freq: Four times a day (QID) | RECTAL | Status: DC | PRN

## 2017-04-03 MED ORDER — CEFOTETAN DISODIUM-DEXTROSE 2-2.08 GM-% IV SOLR
INTRAVENOUS | Status: AC
  Filled 2017-04-03: qty 50

## 2017-04-03 MED ORDER — PROPOFOL 10 MG/ML IV BOLUS
INTRAVENOUS | Status: DC | PRN
  Administered 2017-04-03: 20 mg via INTRAVENOUS
  Administered 2017-04-03: 100 mg via INTRAVENOUS

## 2017-04-03 MED ORDER — DEXTROSE 5 % IV SOLN
2.0000 g | INTRAVENOUS | Status: AC
  Administered 2017-04-03: 2 g via INTRAVENOUS

## 2017-04-03 MED ORDER — FENTANYL CITRATE (PF) 250 MCG/5ML IJ SOLN
INTRAMUSCULAR | Status: AC
  Filled 2017-04-03: qty 5

## 2017-04-03 MED ORDER — LIDOCAINE 2% (20 MG/ML) 5 ML SYRINGE
INTRAMUSCULAR | Status: DC | PRN
  Administered 2017-04-03: 60 mg via INTRAVENOUS

## 2017-04-03 MED ORDER — SIMETHICONE 80 MG PO CHEW: 40.0000 mg | CHEWABLE_TABLET | Freq: Four times a day (QID) | ORAL | Status: DC | PRN

## 2017-04-03 MED ORDER — FENTANYL CITRATE (PF) 100 MCG/2ML IJ SOLN
INTRAMUSCULAR | Status: DC | PRN
  Administered 2017-04-03: 25 ug via INTRAVENOUS
  Administered 2017-04-03: 100 ug via INTRAVENOUS
  Administered 2017-04-03 (×2): 25 ug via INTRAVENOUS

## 2017-04-03 MED ORDER — 0.9 % SODIUM CHLORIDE (POUR BTL) OPTIME
TOPICAL | Status: DC | PRN
  Administered 2017-04-03: 3000 mL

## 2017-04-03 MED ORDER — PHENYLEPHRINE HCL 10 MG/ML IJ SOLN
INTRAMUSCULAR | Status: DC | PRN
  Administered 2017-04-03 (×2): 40 ug via INTRAVENOUS

## 2017-04-03 MED ORDER — MORPHINE SULFATE (PF) 2 MG/ML IV SOLN: 1.0000 mg | INTRAVENOUS | Status: DC | PRN

## 2017-04-03 MED ORDER — MORPHINE SULFATE (PF) 2 MG/ML IV SOLN
2.0000 mg | INTRAVENOUS | Status: DC | PRN
  Administered 2017-04-03 (×2): 2 mg via INTRAVENOUS
  Filled 2017-04-03 (×3): qty 1

## 2017-04-03 MED ORDER — KCL IN DEXTROSE-NACL 20-5-0.9 MEQ/L-%-% IV SOLN
INTRAVENOUS | Status: DC
  Administered 2017-04-03 (×2): via INTRAVENOUS
  Administered 2017-04-04: 125 mL/h via INTRAVENOUS
  Administered 2017-04-04 – 2017-04-09 (×14): via INTRAVENOUS
  Filled 2017-04-03 (×21): qty 1000

## 2017-04-03 MED ORDER — HYDRALAZINE HCL 20 MG/ML IJ SOLN: 10.0000 mg | INTRAMUSCULAR | Status: DC | PRN

## 2017-04-03 MED ORDER — PHENYLEPHRINE 40 MCG/ML (10ML) SYRINGE FOR IV PUSH (FOR BLOOD PRESSURE SUPPORT)
PREFILLED_SYRINGE | INTRAVENOUS | Status: DC | PRN
  Administered 2017-04-03: 80 ug via INTRAVENOUS

## 2017-04-03 MED ORDER — ONDANSETRON 4 MG PO TBDP
4.0000 mg | ORAL_TABLET | Freq: Four times a day (QID) | ORAL | Status: DC | PRN
  Administered 2017-04-11: 4 mg via ORAL
  Filled 2017-04-03: qty 1

## 2017-04-03 MED ORDER — EPHEDRINE SULFATE-NACL 50-0.9 MG/10ML-% IV SOSY
PREFILLED_SYRINGE | INTRAVENOUS | Status: DC | PRN
  Administered 2017-04-03: 10 mg via INTRAVENOUS

## 2017-04-03 MED ORDER — SUGAMMADEX SODIUM 200 MG/2ML IV SOLN
INTRAVENOUS | Status: DC | PRN
  Administered 2017-04-03: 100 mg via INTRAVENOUS

## 2017-04-03 MED ORDER — LACTATED RINGERS IV SOLN
INTRAVENOUS | Status: DC | PRN
Start: 2017-04-03 — End: 2017-04-03
  Administered 2017-04-03 (×2): via INTRAVENOUS

## 2017-04-03 MED ORDER — DIPHENHYDRAMINE HCL 50 MG/ML IJ SOLN
12.5000 mg | Freq: Four times a day (QID) | INTRAMUSCULAR | Status: DC | PRN
Start: 2017-04-03 — End: 2017-04-12
  Filled 2017-04-03: qty 1

## 2017-04-03 MED ORDER — FENTANYL CITRATE (PF) 100 MCG/2ML IJ SOLN
25.0000 ug | INTRAMUSCULAR | Status: DC | PRN
  Administered 2017-04-03 (×4): 25 ug via INTRAVENOUS

## 2017-04-03 MED ORDER — ONDANSETRON HCL 4 MG/2ML IJ SOLN
INTRAMUSCULAR | Status: DC | PRN
  Administered 2017-04-03: 4 mg via INTRAVENOUS

## 2017-04-03 MED ORDER — PHENYLEPHRINE HCL 10 MG/ML IJ SOLN
INTRAVENOUS | Status: DC | PRN
  Administered 2017-04-03: 30 ug/min via INTRAVENOUS

## 2017-04-03 MED ORDER — OXYCODONE HCL 5 MG/5ML PO SOLN: 5.0000 mg | Freq: Once | ORAL | Status: DC | PRN

## 2017-04-03 MED ORDER — ENOXAPARIN SODIUM 40 MG/0.4ML ~~LOC~~ SOLN: 40.0000 mg | SUBCUTANEOUS | Status: DC

## 2017-04-03 MED ORDER — PANTOPRAZOLE SODIUM 40 MG IV SOLR
40.0000 mg | Freq: Every day | INTRAVENOUS | Status: DC
  Administered 2017-04-03 – 2017-04-08 (×6): 40 mg via INTRAVENOUS
  Filled 2017-04-03 (×6): qty 40

## 2017-04-03 SURGICAL SUPPLY — 41 items
BLADE CLIPPER SURG (BLADE) IMPLANT
CANISTER SUCT 3000ML PPV (MISCELLANEOUS) ×2 IMPLANT
CHLORAPREP W/TINT 26ML (MISCELLANEOUS) ×2 IMPLANT
COVER SURGICAL LIGHT HANDLE (MISCELLANEOUS) ×2 IMPLANT
DRAPE LAPAROSCOPIC ABDOMINAL (DRAPES) ×1 IMPLANT
DRAPE WARM FLUID 44X44 (DRAPE) ×2 IMPLANT
DRSG OPSITE POSTOP 4X10 (GAUZE/BANDAGES/DRESSINGS) IMPLANT
DRSG OPSITE POSTOP 4X8 (GAUZE/BANDAGES/DRESSINGS) ×1 IMPLANT
ELECT BLADE 6.5 EXT (BLADE) IMPLANT
ELECT CAUTERY BLADE 6.4 (BLADE) ×2 IMPLANT
ELECT REM PT RETURN 9FT ADLT (ELECTROSURGICAL) ×2
ELECTRODE REM PT RTRN 9FT ADLT (ELECTROSURGICAL) ×1 IMPLANT
GLOVE BIO SURGEON STRL SZ8 (GLOVE) ×2 IMPLANT
GLOVE BIOGEL PI IND STRL 8 (GLOVE) ×1 IMPLANT
GLOVE BIOGEL PI INDICATOR 8 (GLOVE) ×1
GOWN STRL REUS W/ TWL LRG LVL3 (GOWN DISPOSABLE) ×1 IMPLANT
GOWN STRL REUS W/ TWL XL LVL3 (GOWN DISPOSABLE) ×1 IMPLANT
GOWN STRL REUS W/TWL LRG LVL3 (GOWN DISPOSABLE) ×2
GOWN STRL REUS W/TWL XL LVL3 (GOWN DISPOSABLE) ×4
KIT BASIN OR (CUSTOM PROCEDURE TRAY) ×2 IMPLANT
KIT ROOM TURNOVER OR (KITS) ×2 IMPLANT
LIGASURE IMPACT 36 18CM CVD LR (INSTRUMENTS) ×1 IMPLANT
NS IRRIG 1000ML POUR BTL (IV SOLUTION) ×5 IMPLANT
PACK GENERAL/GYN (CUSTOM PROCEDURE TRAY) ×2 IMPLANT
PAD ARMBOARD 7.5X6 YLW CONV (MISCELLANEOUS) ×4 IMPLANT
RELOAD PROXIMATE 75MM BLUE (ENDOMECHANICALS) ×4 IMPLANT
RELOAD STAPLE 75 3.8 BLU REG (ENDOMECHANICALS) IMPLANT
SPECIMEN JAR LARGE (MISCELLANEOUS) IMPLANT
SPONGE LAP 18X18 X RAY DECT (DISPOSABLE) ×1 IMPLANT
STAPLER GUN LINEAR PROX 60 (STAPLE) ×1 IMPLANT
STAPLER PROXIMATE 75MM BLUE (STAPLE) ×1 IMPLANT
STAPLER VISISTAT 35W (STAPLE) ×2 IMPLANT
SUCTION POOLE TIP (SUCTIONS) ×2 IMPLANT
SUT PDS AB 1 TP1 96 (SUTURE) ×4 IMPLANT
SUT SILK 2 0 SH CR/8 (SUTURE) ×2 IMPLANT
SUT SILK 2 0 TIES 10X30 (SUTURE) ×2 IMPLANT
SUT SILK 3 0 SH CR/8 (SUTURE) ×2 IMPLANT
SUT SILK 3 0 TIES 10X30 (SUTURE) ×2 IMPLANT
TOWEL OR 17X26 10 PK STRL BLUE (TOWEL DISPOSABLE) ×2 IMPLANT
TRAY FOLEY W/METER SILVER 16FR (SET/KITS/TRAYS/PACK) ×1 IMPLANT
YANKAUER SUCT BULB TIP NO VENT (SUCTIONS) IMPLANT

## 2017-04-03 NOTE — Anesthesia Preprocedure Evaluation (Signed)
Anesthesia Evaluation  Patient identified by MRN, date of birth, ID band Patient awake    Reviewed: Allergy & Precautions, NPO status , Patient's Chart, lab work & pertinent test results  History of Anesthesia Complications Negative for: history of anesthetic complications  Airway Mallampati: II  TM Distance: >3 FB Neck ROM: Full    Dental  (+) Teeth Intact   Pulmonary neg shortness of breath, neg sleep apnea, neg COPD, neg recent URI, former smoker,    breath sounds clear to auscultation       Cardiovascular hypertension, Pt. on medications  Rhythm:Regular     Neuro/Psych PSYCHIATRIC DISORDERS Depression negative neurological ROS     GI/Hepatic Neg liver ROS, GERD  ,  Endo/Other  negative endocrine ROS  Renal/GU negative Renal ROS     Musculoskeletal   Abdominal   Peds  Hematology negative hematology ROS (+)   Anesthesia Other Findings   Reproductive/Obstetrics                             Anesthesia Physical Anesthesia Plan  ASA: II and emergent  Anesthesia Plan: General   Post-op Pain Management:    Induction: Intravenous  Airway Management Planned: Oral ETT  Additional Equipment: None  Intra-op Plan:   Post-operative Plan: Extubation in OR and Possible Post-op intubation/ventilation  Informed Consent: I have reviewed the patients History and Physical, chart, labs and discussed the procedure including the risks, benefits and alternatives for the proposed anesthesia with the patient or authorized representative who has indicated his/her understanding and acceptance.   Dental advisory given  Plan Discussed with: CRNA and Surgeon  Anesthesia Plan Comments:         Anesthesia Quick Evaluation

## 2017-04-03 NOTE — Progress Notes (Signed)
Day of Surgery  Subjective: CC in pre-op holding, no new complaints  Objective: Vital signs in last 24 hours: Temp:  [97.6 F (36.4 C)-98.3 F (36.8 C)] 98.3 F (36.8 C) (04/14 0729) Pulse Rate:  [39-100] 77 (04/14 0729) Resp:  [16-26] 18 (04/14 0729) BP: (132-159)/(60-84) 150/82 (04/14 0729) SpO2:  [94 %-100 %] 95 % (04/14 0729) Weight:  [81.1 kg (178 lb 12.7 oz)] 81.1 kg (178 lb 12.7 oz) (04/14 0422)    Intake/Output from previous day: 04/13 0701 - 04/14 0700 In: 550 [I.V.:50; IV Piggyback:500] Out: 150 [Urine:150] Intake/Output this shift: No intake/output data recorded.  General appearance: cooperative Resp: clear to auscultation bilaterally Cardio: regular rate and rhythm GI: soft, mild dist, not sig tender  Lab Results:   Recent Labs  04/02/17 2313 04/03/17 0612  WBC 19.8* 15.6*  HGB 14.5 13.7  HCT 44.2 41.9  PLT 371 308   BMET  Recent Labs  04/02/17 2313 04/03/17 0612  NA 139 140  K 3.5 3.4*  CL 100* 101  CO2 26 30  GLUCOSE 167* 155*  BUN 32* 30*  CREATININE 1.54* 1.22  CALCIUM 9.5 9.0   PT/INR No results for input(s): LABPROT, INR in the last 72 hours. ABG No results for input(s): PHART, HCO3 in the last 72 hours.  Invalid input(s): PCO2, PO2  Studies/Results: Ct Abdomen Pelvis W Contrast  Addendum Date: 04/02/2017   ADDENDUM REPORT: 04/02/2017 22:55 ADDENDUM: Critical Value/emergent results were called by telephone at the time of interpretation on 04/02/2017 at 9:15 pm to [facility] , who verbally acknowledged these results. Electronically Signed   By: Ashley Royalty M.D.   On: 04/02/2017 22:55   Result Date: 04/02/2017 CLINICAL DATA:  Umbilical pain radiating to the mid chest intermittently x1 week. EXAM: CT ABDOMEN AND PELVIS WITH CONTRAST TECHNIQUE: Multidetector CT imaging of the abdomen and pelvis was performed using the standard protocol following bolus administration of intravenous contrast. CONTRAST:  14mL ISOVUE-300  IOPAMIDOL (ISOVUE-300) INJECTION 61%, 74mL ISOVUE-300 IOPAMIDOL (ISOVUE-300) INJECTION 61% COMPARISON:  KUB from 12/17/2016 FINDINGS: Lower chest: Normal size cardiac chambers with three-vessel coronary arteriosclerosis. No pericardial effusion. 7 mm partially calcified left lower lobe pulmonary nodule may represent a small granuloma or hemorrhage thymoma. Scarring is seen medially at the right lung base. Hepatobiliary: Gas is noted within a contracted thick-walled gallbladder lumen with fistulous connection to the second portion of the duodenum. Surrounding inflammatory change is noted. There is fluid-filled distention of small bowel secondary to a gallstone ileus with a gallstone seen within the distal ileum, situated in the right hemipelvis. The stone measures approximately 2.2 cm in diameter and is lamellated in appearance. Pancreas: Marked atrophy of the pancreas. Spleen: Normal in size without focal abnormality. Adrenals/Urinary Tract: There is a 1.7 x 1 6 cm mixed attenuating nodule. The right adrenal gland is normal. Bilateral renal cysts are present without obstructive uropathy. No solid enhancing mass, nephrolithiasis nor hydroureteronephrosis. Stomach/Bowel: Obstructing gallstone in the distal ileum causing fluid-filled distention of more proximal small bowel measuring up to 4.4 cm. Distal to this stone, the ileum is decompressed. Large bowel is unremarkable apart from scattered colonic diverticulosis. Vascular/Lymphatic: Aortoiliac and branch vessel atherosclerosis without aneurysm. No lymphadenopathy. Reproductive: The prostate contains radiation seeds and is mildly enlarged. Other: No abdominal wall hernia or abnormality. No abdominopelvic ascites. Musculoskeletal: Thoracolumbar spondylosis. No acute nor suspicious osseous abnormalities. IMPRESSION: 1. There is an obstructing 2.2 cm gallstone in the distal ileum which appears to have eroded through the gallbladder wall  and second portion of the  duodenum with resultant fistulous connection seen. This is causing a "gallstone ileus" of small bowel loops leading up to the obstructing stone seen in the right lower hemipelvis. 2. There is a heterogeneously enhancing 1.7 x 1.6 cm left adrenal nodule which can be further worked up with CT or MRI without and with IV contrast. 3. Aortoiliac and branch vessel atherosclerosis without aneurysm. Three-vessel coronary arteriosclerosis is noted. 4. A 7 mm left lower lobe partially calcified nodule is noted possibly a granuloma or potentially hamartoma. Electronically Signed: By: Ashley Royalty M.D. On: 04/02/2017 21:35    Anti-infectives: Anti-infectives    Start     Dose/Rate Route Frequency Ordered Stop   04/03/17 0800  [MAR Hold]  cefoTEtan (CEFOTAN) 2 g in dextrose 5 % 50 mL IVPB     (MAR Hold since 04/03/17 0741)   2 g 100 mL/hr over 30 Minutes Intravenous To Short Stay 04/03/17 0424 04/04/17 0800      Assessment/Plan: Gallstone ileus - to OR for ex lap, removal gallstone, possible bowel resection. Procedure, risks, benefits discussed. I also spoke with his son who is signing consent. We discussed the expected post-op course.   LOS: 0 days    Alec Davis E 04/03/2017

## 2017-04-03 NOTE — Progress Notes (Addendum)
Late entry: Patient came to short stay will blood on left hand and right hand IV missing. Patient attempted to get out of bed. Before patient got stood up I was able to get him back in bed. I put bed in lowest position and stayed with him until family came down. Patient alert and oriented to person only, but was able to tell me that he had something going on with his stomach. Patient had signed consent due to confusion I had son sign a new consent form. I asked about patient's wife possibly giving verbal consent, however son stated that she was having trouble with knowing exactly what was going on and was having some intermittent confusion. Notified PACU and 6N RN that patient had attempted to get out of bed.

## 2017-04-03 NOTE — ED Notes (Signed)
Attempted to place NG tube. Pt pulled tube out. Pt stated "oh hell no you aren't putting that in me".

## 2017-04-03 NOTE — Anesthesia Postprocedure Evaluation (Addendum)
Anesthesia Post Note  Patient: Alec Davis  Procedure(s) Performed: Procedure(s) (LRB): EXPLORATORY LAPAROTOMY AND RESECTION OF GALLSTONE ILEUS. (N/A)  Patient location during evaluation: PACU Anesthesia Type: General Level of consciousness: awake Pain management: pain level controlled Vital Signs Assessment: post-procedure vital signs reviewed and stable Respiratory status: spontaneous breathing, nonlabored ventilation, respiratory function stable and patient connected to nasal cannula oxygen Cardiovascular status: blood pressure returned to baseline and stable Postop Assessment: no signs of nausea or vomiting Anesthetic complications: no       Last Vitals:  Vitals:   04/03/17 1015 04/03/17 1045  BP: (!) 150/86 138/75  Pulse: 80 84  Resp: 20 (!) 30  Temp:      Last Pain:  Vitals:   04/03/17 1045  TempSrc:   PainSc: 5                  Danae Oland

## 2017-04-03 NOTE — Progress Notes (Signed)
Pt pulled NGT out with wrist restraints still on. Fr 16 reinserted without any problems. Mittens applied for safety. Dr. Grandville Silos notified.

## 2017-04-03 NOTE — Op Note (Signed)
04/02/2017 - 04/03/2017  9:30 AM  PATIENT:  Alec Davis  83 y.o. male  PRE-OPERATIVE DIAGNOSIS:  gallstone ileus  POST-OPERATIVE DIAGNOSIS:  gallstone ileus  PROCEDURE:  Procedure(s): EXPLORATORY LAPAROTOMY AND RESECTION OF SMALL BOWEL AND GALLSTONE  SURGEON:  Surgeon(s): [name], MD  ASSISTANTS: none   ANESTHESIA:   general  EBL:  Total I/O In: 1000 [I.V.:1000] Out: 350 [Urine:150; Other:150; Blood:50]  BLOOD ADMINISTERED:none  DRAINS: Nasogastric Tube   SPECIMEN:  Excision  DISPOSITION OF SPECIMEN:  PATHOLOGY  COUNTS:  YES  DICTATION: .Dragon Dictation Findings: 2cm gallstone obstructing distal ileum  Procedure in detail: Mr. Dendinger only to the operative room for gallstone ileus. Informed consent was obtained from his son. He received intravenous antibiotics. He is brought to the operating room and general endotracheal anesthesia was administered by the anesthesia staff. Foley catheter placed by nursing. Abdomen was prepped and draped in a sterile fashion. Time out procedure was performed. Lower midline incision was made. Subcutaneous tissues were dissected down revealing the anterior fascia. This was divided and the peritoneal cavity was entered under direct vision. Fascia was opened to the length of the incision. Dilated small bowel was located. I then ran the small bowel distally identifying the obstructing gallstone. It was at least 2 cm in size and completely occluded the lumen of the ileum at this location. The ileum was divided proximally and distally to the gallstone with GIA-75 stapler. A LigaSure was used to divide the mesentery. Specimen was sent to pathology. A side-to-side anastomosis was made with GIA-75 stapler. Common defect was closed with TX 60. Multiple 3-0 silk sutures were placed along the staple line for good hemostasis and reinforcement. An apical stitch of 2-0 silk was also placed. The anastomosis was viable and did not leak enteric contents  under pressure. The mesenteric defect was closed with a single 3-0 silk stitch. I changed my gloves. The area was copiously irrigated. I rechecked the anastomosis and it remained viable. Irrigation fluid was evacuated. The omentum was brought back down in anatomic position. Fascia was closed with running #1 looped PDS. Subcutaneous tissues were irrigated and the skin was closed with staples. All counts were correct. He tolerated procedure well without apparent complications and was taken recovery in stable condition. PATIENT DISPOSITION:  PACU - hemodynamically stable.   Delay start of Pharmacological VTE agent (>24hrs) due to surgical blood loss or risk of bleeding:  no  [name], MD, MPH, FACS Pager: [phone]  4/14/20189:30 AM

## 2017-04-03 NOTE — Anesthesia Procedure Notes (Signed)
Procedure Name: Intubation Date/Time: 04/03/2017 8:51 AM Performed by: Merdis Delay Pre-anesthesia Checklist: Patient identified, Emergency Drugs available, Suction available, Patient being monitored and Timeout performed Patient Re-evaluated:Patient Re-evaluated prior to inductionOxygen Delivery Method: Circle system utilized Preoxygenation: Pre-oxygenation with 100% oxygen Intubation Type: IV induction and Rapid sequence Laryngoscope Size: Mac and 4 Grade View: Grade I Tube type: Oral Tube size: 7.5 mm Number of attempts: 1 Airway Equipment and Method: Stylet Placement Confirmation: ETT inserted through vocal cords under direct vision,  breath sounds checked- equal and bilateral and positive ETCO2 Secured at: 22 cm Tube secured with: Tape Dental Injury: Teeth and Oropharynx as per pre-operative assessment

## 2017-04-03 NOTE — Progress Notes (Addendum)
Received patient from ED accompanied by wife and son.  Patient AOx1 to person, VS stable, denies any pain, O2Sat at 94% on RA.   Patient now resting on bed comfortably with wife and son at bedside with room lights off.  Will monitor.

## 2017-04-03 NOTE — H&P (Signed)
Alec Davis is an 83 y.o. male.   Chief Complaint: SBO HPI:  Pt is a 83 yo M who presents with nausea/vomiting and significant abdominal pain. He has had pain on and off for several months.  This time, his pain has lasted for 4 days and is in the epigastric location.  He has also been belching quite a bit according to his wife.  He has not had a BM for 2 days.  He denies chest pain or shortness of breath.  Vomit has been dark.  He had pneumonia in the winter, and has never quite recovered his energy.  He has also had some bloating over the last few days.  He came to the ED 2 days ago.  He was going to have a GI referral, but labs demonstrated an elevated WBC count.  He was sent for a CT at [facility] which demonstrated a small bowel obstruction secondary to obstructing gallstone.    His main complaint at this point is thirst.  Past Medical History:  Diagnosis Date  . BPH (benign prostatic hyperplasia)   . Cataract   . GERD (gastroesophageal reflux disease)   . Hyperlipidemia   . Hypertension   . PROSTATE CANCER 01/19/2011   Qualifier: Diagnosis of  By: Nils Pyle CMA ([employer]), [name]    . Vitamin D deficiency   Heart block, mobitz type 1.  Past Surgical History:  Procedure Laterality Date  . cataracts  Bilateral    Dr [name]  . SPINE SURGERY      Family History  Problem Relation Age of Onset  . Alzheimer's disease Brother    Social History:  reports that he quit smoking about 67 years ago. He has never used smokeless tobacco. He reports that he does not drink alcohol or use drugs.  Allergies: No Known Allergies  Meds:  aspirin 81 MG EC tablet    Calcium Carb-Cholecalciferol (CALCIUM 600 + D PO)    lovastatin (MEVACOR) 20 MG tablet    ondansetron (ZOFRAN) 4 MG tablet    pantoprazole (PROTONIX) 20 MG tablet    Vitamin D, Cholecalciferol, 1000 UNITS TABS    Mark as Reviewed     Results for orders placed or performed during the hospital encounter of 04/02/17 (from the past 48  hour(s))  Comprehensive metabolic panel     Status: Abnormal   Collection Time: 04/02/17 11:13 PM  Result Value Ref Range   Sodium 139 135 - 145 mmol/L   Potassium 3.5 3.5 - 5.1 mmol/L   Chloride 100 (L) 101 - 111 mmol/L   CO2 26 22 - 32 mmol/L   Glucose, Bld 167 (H) 65 - 99 mg/dL   BUN 32 (H) 6 - 20 mg/dL   Creatinine, Ser 1.54 (H) 0.61 - 1.24 mg/dL   Calcium 9.5 8.9 - 10.3 mg/dL   Total Protein 7.7 6.5 - 8.1 g/dL   Albumin 3.6 3.5 - 5.0 g/dL   AST 30 15 - 41 U/L   ALT 18 17 - 63 U/L   Alkaline Phosphatase 67 38 - 126 U/L   Total Bilirubin 1.0 0.3 - 1.2 mg/dL   GFR calc non Af Amer 38 (L) >60 mL/min   GFR calc Af Amer 44 (L) >60 mL/min    Comment: (NOTE) The eGFR has been calculated using the CKD EPI equation. This calculation has not been validated in all clinical situations. eGFR's persistently <60 mL/min signify possible Chronic Kidney Disease.    Anion gap 13 5 - 15  CBC     Status: Abnormal   Collection Time: 04/02/17 11:13 PM  Result Value Ref Range   WBC 19.8 (H) 4.0 - 10.5 K/uL   RBC 4.70 4.22 - 5.81 MIL/uL   Hemoglobin 14.5 13.0 - 17.0 g/dL   HCT 44.2 39.0 - 52.0 %   MCV 94.0 78.0 - 100.0 fL   MCH 30.9 26.0 - 34.0 pg   MCHC 32.8 30.0 - 36.0 g/dL   RDW 13.6 11.5 - 15.5 %   Platelets 371 150 - 400 K/uL  Lipase, blood     Status: None   Collection Time: 04/03/17 12:36 AM  Result Value Ref Range   Lipase 15 11 - 51 U/L  Type and screen     Status: None   Collection Time: 04/03/17 12:40 AM  Result Value Ref Range   ABO/RH(D) O POS    Antibody Screen NEG    Sample Expiration 04/06/2017    Ct Abdomen Pelvis W Contrast  Addendum Date: 04/02/2017   ADDENDUM REPORT: 04/02/2017 22:55 ADDENDUM: Critical Value/emergent results were called by telephone at the time of interpretation on 04/02/2017 at 9:15 pm to [facility] , who verbally acknowledged these results. Electronically Signed   By: Ashley Royalty M.D.   On: 04/02/2017 22:55   Result Date:  04/02/2017 CLINICAL DATA:  Umbilical pain radiating to the mid chest intermittently x1 week. EXAM: CT ABDOMEN AND PELVIS WITH CONTRAST TECHNIQUE: Multidetector CT imaging of the abdomen and pelvis was performed using the standard protocol following bolus administration of intravenous contrast. CONTRAST:  113m ISOVUE-300 IOPAMIDOL (ISOVUE-300) INJECTION 61%, 3107mISOVUE-300 IOPAMIDOL (ISOVUE-300) INJECTION 61% COMPARISON:  KUB from 12/17/2016 FINDINGS: Lower chest: Normal size cardiac chambers with three-vessel coronary arteriosclerosis. No pericardial effusion. 7 mm partially calcified left lower lobe pulmonary nodule may represent a small granuloma or hemorrhage thymoma. Scarring is seen medially at the right lung base. Hepatobiliary: Gas is noted within a contracted thick-walled gallbladder lumen with fistulous connection to the second portion of the duodenum. Surrounding inflammatory change is noted. There is fluid-filled distention of small bowel secondary to a gallstone ileus with a gallstone seen within the distal ileum, situated in the right hemipelvis. The stone measures approximately 2.2 cm in diameter and is lamellated in appearance. Pancreas: Marked atrophy of the pancreas. Spleen: Normal in size without focal abnormality. Adrenals/Urinary Tract: There is a 1.7 x 1 6 cm mixed attenuating nodule. The right adrenal gland is normal. Bilateral renal cysts are present without obstructive uropathy. No solid enhancing mass, nephrolithiasis nor hydroureteronephrosis. Stomach/Bowel: Obstructing gallstone in the distal ileum causing fluid-filled distention of more proximal small bowel measuring up to 4.4 cm. Distal to this stone, the ileum is decompressed. Large bowel is unremarkable apart from scattered colonic diverticulosis. Vascular/Lymphatic: Aortoiliac and branch vessel atherosclerosis without aneurysm. No lymphadenopathy. Reproductive: The prostate contains radiation seeds and is mildly enlarged. Other: No  abdominal wall hernia or abnormality. No abdominopelvic ascites. Musculoskeletal: Thoracolumbar spondylosis. No acute nor suspicious osseous abnormalities. IMPRESSION: 1. There is an obstructing 2.2 cm gallstone in the distal ileum which appears to have eroded through the gallbladder wall and second portion of the duodenum with resultant fistulous connection seen. This is causing a "gallstone ileus" of small bowel loops leading up to the obstructing stone seen in the right lower hemipelvis. 2. There is a heterogeneously enhancing 1.7 x 1.6 cm left adrenal nodule which can be further worked up with CT or MRI without and with IV contrast. 3. Aortoiliac and branch vessel  atherosclerosis without aneurysm. Three-vessel coronary arteriosclerosis is noted. 4. A 7 mm left lower lobe partially calcified nodule is noted possibly a granuloma or potentially hamartoma. Electronically Signed: By: Ashley Royalty M.D. On: 04/02/2017 21:35    Review of Systems  Constitutional: Negative.   HENT: Negative.   Eyes: Negative.   Respiratory: Negative.   Cardiovascular: Negative.   Gastrointestinal: Positive for abdominal pain, constipation, heartburn, nausea and vomiting.  Genitourinary: Negative.   Musculoskeletal: Negative.   Skin: Negative.   Neurological: Negative.   Endo/Heme/Allergies: Negative.   Psychiatric/Behavioral: Negative.      Blood pressure 137/66, pulse 89, temperature 97.9 F (36.6 C), temperature source Oral, resp. rate (!) 23, SpO2 99 %. Physical Exam  Constitutional: He is oriented to person, place, and time. He appears well-developed and well-nourished. He appears distressed.  HENT:  Head: Normocephalic and atraumatic.  Right Ear: External ear normal.  Left Ear: External ear normal.  Eyes: Conjunctivae are normal. No scleral icterus.  Neck: Neck supple. No tracheal deviation present. No thyromegaly present.  Cardiovascular: Normal rate and intact distal pulses.   Respiratory: Effort  normal. No respiratory distress. He exhibits no tenderness.  GI: Soft. He exhibits distension. He exhibits no mass. There is tenderness (BLQ). There is no rebound and no guarding.  Musculoskeletal: Deformity: no gross deformity.  Lymphadenopathy:    He has no cervical adenopathy.  Neurological: He is oriented to person, place, and time. Cranial nerve deficit: hard of hearing. Coordination normal.  Skin: Skin is warm and dry. No rash noted. He is not diaphoretic. No erythema. No pallor.  Psychiatric: He has a normal mood and affect. His behavior is normal. Judgment and thought content normal.      Assessment/Plan Gallstone ileus with bowel obstruction Heart block, mobitz type 1 HTN h/o prostate cancer Acute kidney injury  NPO NGT to LIWS Nausea and pain control as needed. IV fluids including bolus.  Recheck Cr in AM. Will need OR for ex lap and removal of gallstone.  Typically we do not remove gallbladder in that case as the fistula from the gallbladder to the duodenum frequently heals on its own or remains patent and not problematic.  This is especially true in this age demographic.     Stark Klein, MD 04/03/2017, 1:39 AM

## 2017-04-03 NOTE — Transfer of Care (Signed)
Immediate Anesthesia Transfer of Care Note  Patient: Alec Davis  Procedure(s) Performed: Procedure(s): EXPLORATORY LAPAROTOMY AND RESECTION OF GALLSTONE ILEUS. (N/A)  Patient Location: PACU  Anesthesia Type:General  Level of Consciousness: confused  Airway & Oxygen Therapy: Patient Spontanous Breathing and Patient connected to nasal cannula oxygen  Post-op Assessment: Report given to RN and Post -op Vital signs reviewed and stable  Post vital signs: Reviewed and stable  Last Vitals:  Vitals:   04/03/17 0422 04/03/17 0729  BP: (!) 159/61 (!) 150/82  Pulse: 90 77  Resp: 18 18  Temp: 36.4 C 36.8 C    Last Pain:  Vitals:   04/03/17 0729  TempSrc: Oral  PainSc:          Complications: No apparent anesthesia complications

## 2017-04-04 ENCOUNTER — Encounter (HOSPITAL_COMMUNITY): Payer: Self-pay | Admitting: General Surgery

## 2017-04-04 LAB — CBC
HEMATOCRIT: 40.7 % (ref 39.0–52.0)
Hemoglobin: 13 g/dL (ref 13.0–17.0)
MCH: 30.4 pg (ref 26.0–34.0)
MCHC: 31.9 g/dL (ref 30.0–36.0)
MCV: 95.1 fL (ref 78.0–100.0)
PLATELETS: 278 10*3/uL (ref 150–400)
RBC: 4.28 MIL/uL (ref 4.22–5.81)
RDW: 14.1 % (ref 11.5–15.5)
WBC: 10.6 10*3/uL — AB (ref 4.0–10.5)

## 2017-04-04 LAB — BASIC METABOLIC PANEL
Anion gap: 4 — ABNORMAL LOW (ref 5–15)
BUN: 20 mg/dL (ref 6–20)
CALCIUM: 8.2 mg/dL — AB (ref 8.9–10.3)
CO2: 30 mmol/L (ref 22–32)
CREATININE: 1.1 mg/dL (ref 0.61–1.24)
Chloride: 110 mmol/L (ref 101–111)
GFR, EST NON AFRICAN AMERICAN: 57 mL/min — AB (ref >60)
GLUCOSE: 163 mg/dL — AB (ref 65–99)
Potassium: 3.6 mmol/L (ref 3.5–5.1)
Sodium: 144 mmol/L (ref 135–145)

## 2017-04-04 MED ORDER — ORAL CARE MOUTH RINSE
15.0000 mL | Freq: Two times a day (BID) | OROMUCOSAL | Status: DC
  Administered 2017-04-04 – 2017-04-10 (×11): 15 mL via OROMUCOSAL

## 2017-04-04 MED ORDER — WHITE PETROLATUM GEL
Status: DC | PRN
  Filled 2017-04-04: qty 1

## 2017-04-04 NOTE — Progress Notes (Signed)
PT Cancellation Note  Patient Details Name: Alec Davis MRN: [id number] DOB: 1923/03/01   Cancelled Treatment:    Reason Eval/Treat Not Completed: Medical issues which prohibited therapy. Per RN, pt is confused and agitated. Rn afraid pt will pull out IV is mobilized to chair. Rn requesting PT hold until tomorrow for eval.    Scheryl Marten PT, DPT  [phone]  04/04/2017, 12:19 PM

## 2017-04-04 NOTE — Progress Notes (Signed)
1 Day Post-Op  Subjective: Awake.  Confused and disoriented to place and situation but not agitated. Pulled NG tube out I explained to him and the patient's nurse that this woman to be reinserted due to small bowel distention at the time of OR yesterday Good urine output.  SPO2 97%.  BP 140/70.  Respiratory rate 17.  Heart rate 80.   Hemoglobin 13.0.  WBC down to 10,600.  Potassium 3.6.  Creatinine down to 1.10.  Glucose 163.   Objective: Vital signs in last 24 hours: Temp:  [97.3 F (36.3 C)-98.4 F (36.9 C)] 97.5 F (36.4 C) (04/15 0635) Pulse Rate:  [76-97] 80 (04/15 0635) Resp:  [15-30] 17 (04/15 0635) BP: (138-150)/(66-95) 140/70 (04/15 0635) SpO2:  [95 %-99 %] 97 % (04/15 0635)    Intake/Output from previous day: 04/14 0701 - 04/15 0700 In: 2200 [I.V.:2200] Out: 2870 [Urine:1050; Emesis/NG output:600; Blood:50] Intake/Output this shift: No intake/output data recorded.  General appearance: Awake.  Skin warm and dry.  Minimal distress. Resp: Clear to auscultation but diminished breath sounds at bases.  No wheeze or rhonchi. GI: Soft.  Appropriate incisional tenderness.  Wound okay.  Silent.  Not obviously distended Neurologic: Awake.  Speech reasonably normal.  Moves all 4 continues to command.  No gross motor or sensory deficit.  Disoriented to place and situation.  Lab Results:  Results for orders placed or performed during the hospital encounter of 04/02/17 (from the past 24 hour(s))  CBC     Status: Abnormal   Collection Time: 04/04/17  4:37 AM  Result Value Ref Range   WBC 10.6 (H) 4.0 - 10.5 K/uL   RBC 4.28 4.22 - 5.81 MIL/uL   Hemoglobin 13.0 13.0 - 17.0 g/dL   HCT 40.7 39.0 - 52.0 %   MCV 95.1 78.0 - 100.0 fL   MCH 30.4 26.0 - 34.0 pg   MCHC 31.9 30.0 - 36.0 g/dL   RDW 14.1 11.5 - 15.5 %   Platelets 278 150 - 400 K/uL  Basic metabolic panel     Status: Abnormal   Collection Time: 04/04/17  4:37 AM  Result Value Ref Range   Sodium 144 135 - 145 mmol/L   Potassium 3.6 3.5 - 5.1 mmol/L   Chloride 110 101 - 111 mmol/L   CO2 30 22 - 32 mmol/L   Glucose, Bld 163 (H) 65 - 99 mg/dL   BUN 20 6 - 20 mg/dL   Creatinine, Ser 1.10 0.61 - 1.24 mg/dL   Calcium 8.2 (L) 8.9 - 10.3 mg/dL   GFR calc non Af Amer 57 (L) >60 mL/min   GFR calc Af Amer >60 >60 mL/min   Anion gap 4 (L) 5 - 15     Studies/Results: Dg Abd Portable 1v  Result Date: 04/03/2017 CLINICAL DATA:  Check for nasogastric catheter placement EXAM: PORTABLE ABDOMEN - 1 VIEW COMPARISON:  04/02/2017 FINDINGS: Mild small bowel obstructive changes are noted. Nasogastric catheter is seen within the stomach. IMPRESSION: Nasogastric catheter within the stomach. Electronically Signed   By: Inez Catalina M.D.   On: 04/03/2017 18:41    . enoxaparin (LOVENOX) injection  40 mg Subcutaneous Q24H  . pantoprazole (PROTONIX) IV  40 mg Intravenous QHS     Assessment/Plan: s/p Procedure(s): EXPLORATORY LAPAROTOMY AND RESECTION OF GALLSTONE ILEUS.  POD #1.  Laparotomy, small bowel resection for gallstone ileus.   Stable  Reinsert and continue nasogastric suction  Mobilize out of bed  Incentive spirometry  PT and OT consults  DVT prophylaxis.  --Lovenox   BPH  GERD  Hypertension  History prostate cancer  Heart block, Mobitz type I  AKI.  Improving.    @PROBHOSP @  LOS: 1 day    Shakaria Raphael M 04/04/2017  . .prob

## 2017-04-04 NOTE — Progress Notes (Signed)
Patient managed to pull out NG tube with restraints and mittens. Patient managed to loosen mittens enough as to where they started to slide off. Attempted to reinsert NG into both nares but patient met resistance and bleeding from nose.

## 2017-04-04 NOTE — Progress Notes (Signed)
#  12 Fr. NG tube inserted into right nare without difficulty.  Is more cooperative now, especially with wife and daughter in attendance.  However, he did state he would "pull this tube out" when he had the chance.  Bil. Arm restraints continued.

## 2017-04-05 ENCOUNTER — Inpatient Hospital Stay (HOSPITAL_COMMUNITY): Payer: Medicare Other

## 2017-04-05 LAB — CBC
HCT: 36.9 % — ABNORMAL LOW (ref 39.0–52.0)
Hemoglobin: 11.6 g/dL — ABNORMAL LOW (ref 13.0–17.0)
MCH: 30 pg (ref 26.0–34.0)
MCHC: 31.4 g/dL (ref 30.0–36.0)
MCV: 95.3 fL (ref 78.0–100.0)
PLATELETS: 225 10*3/uL (ref 150–400)
RBC: 3.87 MIL/uL — AB (ref 4.22–5.81)
RDW: 13.8 % (ref 11.5–15.5)
WBC: 9.7 10*3/uL (ref 4.0–10.5)

## 2017-04-05 LAB — BASIC METABOLIC PANEL
ANION GAP: 8 (ref 5–15)
BUN: 13 mg/dL (ref 6–20)
CALCIUM: 7.8 mg/dL — AB (ref 8.9–10.3)
CO2: 26 mmol/L (ref 22–32)
Chloride: 111 mmol/L (ref 101–111)
Creatinine, Ser: 0.78 mg/dL (ref 0.61–1.24)
Glucose, Bld: 123 mg/dL — ABNORMAL HIGH (ref 65–99)
POTASSIUM: 3.5 mmol/L (ref 3.5–5.1)
Sodium: 145 mmol/L (ref 135–145)

## 2017-04-05 NOTE — Care Management Note (Signed)
Case Management Note  Patient Details  Name: Alec Davis MRN: [id number] Date of Birth: Mar 06, 1923  Subjective/Objective:                    Action/Plan:   Expected Discharge Date:                  Expected Discharge Plan:  Skilled Nursing Facility  In-House Referral:  Clinical Social Work  Discharge planning Services     Post Acute Care Choice:    Choice offered to:     DME Arranged:    DME Agency:     HH Arranged:    [place] Agency:     Status of Service:  In process, will continue to follow  If discussed at Long Length of Stay Meetings, dates discussed:    Additional Comments:  Marilu Favre, RN 04/05/2017, 2:41 PM

## 2017-04-05 NOTE — Evaluation (Signed)
Occupational Therapy Evaluation Patient Details Name: Alec Davis MRN: [id number] DOB: 1923/12/02 Today's Date: 04/05/2017    History of Present Illness 83 yo M who presents with nausea/vomiting and significant abdominal pain. Underwent exploratory laparotomy and resection of small bowel and gallstone on 04/03/17. NG tube placed 04/04/17. PMHx: Prostate CA, HTN, Rt BBB, depression   Clinical Impression   PTA, pt lived with his wife and reports to be independent with use of RW occasionally around home. Currently, pt performs grooming seated with set up A and requires Min A for ADLs in standing. Pt presents with unsteadiness in standing and requires to lay down after sit<>stand stating that he did not feel well. Pt would benefit from continued acute OT to increase independence in ADLs and functional mobility. Recommend dc to SNF for further OT to increase pt independence with ADLs and functional mobility.     Follow Up Recommendations    SNF; 24 hour supervision   Equipment Recommendations  Defer to next venue   Recommendations for Other Services PT consult     Precautions / Restrictions Precautions Precautions: Fall Restrictions Weight Bearing Restrictions: No      Mobility Bed Mobility Overal bed mobility: Needs Assistance Bed Mobility: Supine to Sit;Sit to Supine     Supine to sit: Min assist Sit to supine: Min assist   General bed mobility comments: A to push up trunk into sitting  Transfers Overall transfer level: Needs assistance Equipment used: 1 person hand held assist Transfers: Sit to/from Stand Sit to Stand: Min assist         General transfer comment: Demonstrated good static balance. Became unsteady in walking    Balance Overall balance assessment: Needs assistance Sitting-balance support: Feet supported;No upper extremity supported Sitting balance-Leahy Scale: Fair     Standing balance support: No upper extremity supported Standing balance-Leahy  Scale: Fair Standing balance comment: Pt maintained good standing balance without UE support. however required A for weight shifting in standing                           ADL either performed or assessed with clinical judgement   ADL Overall ADL's : Needs assistance/impaired     Grooming: Set up;Oral care;Wash/dry face;Sitting Grooming Details (indicate cue type and reason): Cues to avoid NG tube Upper Body Bathing: Minimal assistance;Sitting   Lower Body Bathing: Minimal assistance;Sit to/from stand   Upper Body Dressing : Minimal assistance;Sitting   Lower Body Dressing: Minimal assistance;Sit to/from stand Lower Body Dressing Details (indicate cue type and reason): Min A for balance in standing               General ADL Comments: Pt performed grooming at EOB with set up and cues for sequencing with NG tube. Needs physical A for ADLs in standing as well as fucntional mobility.      Vision Baseline Vision/History: Wears glasses Wears Glasses: At all times Patient Visual Report: No change from baseline       Perception     Praxis      Pertinent Vitals/Pain Pain Assessment: Faces (Denies pain when asked however states that he is unable to sit up due to "a sore stomach") Faces Pain Scale: Hurts a little bit Pain Location: Abdomen Pain Descriptors / Indicators: Cramping;Discomfort Pain Intervention(s): Monitored during session     Hand Dominance Right   Extremity/Trunk Assessment Upper Extremity Assessment Upper Extremity Assessment: Generalized weakness   Lower Extremity Assessment Lower Extremity  Assessment: Generalized weakness;Defer to PT evaluation   Cervical / Trunk Assessment Cervical / Trunk Assessment: Kyphotic   Communication Communication Communication: No difficulties   Cognition Arousal/Alertness: Awake/alert Behavior During Therapy: WFL for tasks assessed/performed Overall Cognitive Status: No family/caregiver present to determine  baseline cognitive functioning                                 General Comments: Pt oriented to self and year. Able to state location with increased time. And unable to recall reason for admittion   General Comments  Pt very pleasant and willing to participate in therapy    Exercises     Shoulder Instructions      Home Living Family/patient expects to be discharged to:: Private residence Living Arrangements: Spouse/significant other Available Help at Discharge: Family;Available 24 hours/day Type of Home: House Home Access: Stairs to enter [employer] of Steps: 3 Entrance Stairs-Rails: Right;Left Home Layout: One level     Bathroom Shower/Tub: Occupational psychologist: Standard     Home Equipment: Shower seat - built in;Grab bars - tub/shower;Hand held Tourist information centre manager - 2 wheels          Prior Functioning/Environment Level of Independence: Independent        Comments: Pt reports that he performed ADLs and IADLs with no A and use RW occasionally around home        OT Problem List: Decreased strength;Decreased activity tolerance;Impaired balance (sitting and/or standing);Pain      OT Treatment/Interventions: Self-care/ADL training;Therapeutic exercise;DME and/or AE instruction;Energy conservation;Therapeutic activities;Patient/family education    OT Goals(Current goals can be found in the care plan section) Acute Rehab OT Goals Patient Stated Goal: get better OT Goal Formulation: With patient Time For Goal Achievement: 04/19/17 Potential to Achieve Goals: Good  OT Frequency: Min 2X/week   Barriers to D/C:            Co-evaluation              End of Session Equipment Utilized During Treatment: Gait belt Nurse Communication: Mobility status;Other (comment) (Wet bed)  Activity Tolerance: Patient tolerated treatment well;Patient limited by fatigue Patient left: in chair;with call bell/phone within reach;with chair  alarm set;with nursing/sitter in room;with restraints reapplied  OT Visit Diagnosis: Unsteadiness on feet (R26.81);Muscle weakness (generalized) (M62.81)                Time: 0539-7673 OT Time Calculation (min): 30 min Charges:  OT General Charges $OT Visit: 1 Procedure OT Evaluation $OT Eval Moderate Complexity: 1 Procedure OT Treatments $Self Care/Home Management : 8-22 mins G-Codes:     [employer], OTR/L Isabela 04/05/2017, 10:18 AM

## 2017-04-05 NOTE — Progress Notes (Signed)
[facility] Progress Note  2 Days Post-Op  Subjective: CC: sore throat s/p small bowel resection for gallstone ileus Patients daughter is at bedside. The patients confusion has improved today. He denies abdominal pain. Denies flatus/BM. Got up with PT today.   Objective: Vital signs in last 24 hours: Temp:  [97.5 F (36.4 C)-98.6 F (37 C)] 97.5 F (36.4 C) (04/16 1327) Pulse Rate:  [80] 80 (04/16 1327) Resp:  [18-19] 19 (04/16 1327) BP: (148-157)/(68-74) 157/74 (04/16 1327) SpO2:  [96 %-97 %] 97 % (04/16 1327)    Intake/Output from previous day: 04/15 0701 - 04/16 0700 In: 1435 [P.O.:60; I.V.:1375] Out: 1175 [Urine:975; Emesis/NG output:200] Intake/Output this shift: Total I/O In: -  Out: 500 [Urine:250; Emesis/NG output:250]  PE: Gen:  Alert, NAD, pleasant and cooperative Card:  Regular rate and rhythm, pedal pulses 2+ BL Pulm:  Normal respiratory effort, clear to auscultation bilaterally Abd: Soft, appropriately tender   NGT: 800cc/24h - bilious Ext:  No erythema, edema, or tenderness  Lab Results:   Recent Labs  04/04/17 0437 04/05/17 0417  WBC 10.6* 9.7  HGB 13.0 11.6*  HCT 40.7 36.9*  PLT 278 225   BMET  Recent Labs  04/04/17 0437 04/05/17 0417  NA 144 145  K 3.6 3.5  CL 110 111  CO2 30 26  GLUCOSE 163* 123*  BUN 20 13  CREATININE 1.10 0.78  CALCIUM 8.2* 7.8*   PT/INR No results for input(s): LABPROT, INR in the last 72 hours. CMP     Component Value Date/Time   NA 145 04/05/2017 0417   NA 140 02/17/2017 1153   K 3.5 04/05/2017 0417   CL 111 04/05/2017 0417   CO2 26 04/05/2017 0417   GLUCOSE 123 (H) 04/05/2017 0417   BUN 13 04/05/2017 0417   BUN 12 02/17/2017 1153   CREATININE 0.78 04/05/2017 0417   CREATININE 0.87 08/01/2013 0922   CALCIUM 7.8 (L) 04/05/2017 0417   PROT 7.7 04/02/2017 2313   PROT 7.0 02/17/2017 1153   ALBUMIN 3.6 04/02/2017 2313   ALBUMIN 3.8 02/17/2017 1153   AST 30 04/02/2017 2313   ALT 18  04/02/2017 2313   ALKPHOS 67 04/02/2017 2313   BILITOT 1.0 04/02/2017 2313   BILITOT 0.7 02/17/2017 1153   GFRNONAA >60 04/05/2017 0417   GFRNONAA 77 08/01/2013 0922   GFRAA >60 04/05/2017 0417   GFRAA 89 08/01/2013 0922   Lipase     Component Value Date/Time   LIPASE 15 04/03/2017 0036       Studies/Results: Dg Abd Portable 1v  Result Date: 04/03/2017 CLINICAL DATA:  Check for nasogastric catheter placement EXAM: PORTABLE ABDOMEN - 1 VIEW COMPARISON:  04/02/2017 FINDINGS: Mild small bowel obstructive changes are noted. Nasogastric catheter is seen within the stomach. IMPRESSION: Nasogastric catheter within the stomach. Electronically Signed   By: Inez Catalina M.D.   On: 04/03/2017 18:41    Anti-infectives: Anti-infectives    Start     Dose/Rate Route Frequency Ordered Stop   04/03/17 0800  cefoTEtan (CEFOTAN) 2 g in dextrose 5 % 50 mL IVPB     2 g 100 mL/hr over 30 Minutes Intravenous To Short Stay 04/03/17 0424 04/03/17 0902   04/03/17 0754  cefoTEtan in Dextrose 5% (CEFOTAN) 2-2.08 GM-% IVPB    Comments:  Henrine Screws   : cabinet override      04/03/17 0754 04/03/17 1959     Assessment & Plan gallstone ileus S/p Procedure(s): EXPLORATORY LAPAROTOMY AND RESECTION OF GALLSTONE  ILEUS. POD #2.  Laparotomy, small bowel resection - continue NGT to LIWS, still with high output and no bowel function; DG Abd to confirm placement, never ordered 4/15  - Mobilize out of bed  - Incentive spirometry  - leukocytosis resolved   BPH  GERD  Hypertension  History prostate cancer  Heart block, Mobitz type I  AKI: resolved, SCr 0.78 FEN: NPO, IVF, NGT LIWS ID: perioperative cefotetan  DVT prophylaxis: Lovenox    LOS: 2 days    Jill Alexanders , [facility] 04/05/2017, 2:08 PM Pager: [phone] Consults: [phone] Mon-Fri 7:00 am-4:30 pm Sat-Sun 7:00 am-11:30 am

## 2017-04-05 NOTE — Evaluation (Signed)
Physical Therapy Evaluation Patient Details Name: Alec Davis MRN: [id number] DOB: January 23, 1923 Today's Date: 04/05/2017   History of Present Illness  83 yo M who presents with nausea/vomiting and significant abdominal pain. Underwent exploratory laparotomy and resection of small bowel and gallstone on 04/03/17. NG tube placed 04/04/17. PMHx: Prostate CA, HTN, Rt BBB, depression  Clinical Impression  Pt admitted with above diagnosis. Pt currently with functional limitations due to the deficits listed below (see PT Problem List). Pt able to stand pivot to  Chair with min assist and decr safety.  Slightly unsteady.  Will benefit from SNF stay.  Will follow acutely.   Pt will benefit from skilled PT to increase their independence and safety with mobility to allow discharge to the venue listed below.      Follow Up Recommendations Supervision/Assistance - 24 hour;SNF    Equipment Recommendations  Other (comment) (TBA)    Recommendations for Other Services       Precautions / Restrictions Precautions Precautions: Fall Precaution Comments: NG tube Restrictions Weight Bearing Restrictions: No      Mobility  Bed Mobility Overal bed mobility: Needs Assistance Bed Mobility: Supine to Sit;Sit to Supine     Supine to sit: Min assist Sit to supine: Min assist   General bed mobility comments: A to push up trunk into sitting.  Needed cues because he couldn't figure out how to place his UEs to push up without cues.   Transfers Overall transfer level: Needs assistance Equipment used: 1 person hand held assist Transfers: Sit to/from [facility] Sit to Stand: Min assist;From elevated surface Stand pivot transfers: Min assist;From elevated surface       General transfer comment: Pt stood with min assist and took pivotal steps to recliner with min assist with slightly uncontrolled descent into chair.   Ambulation/Gait                Stairs             Wheelchair Mobility    Modified Rankin (Stroke Patients Only)       Balance Overall balance assessment: Needs assistance Sitting-balance support: Feet supported;No upper extremity supported Sitting balance-Leahy Scale: Fair     Standing balance support: No upper extremity supported Standing balance-Leahy Scale: Fair Standing balance comment: Pt maintained good standing balance without UE support. however required A for weight shifting in standing                             Pertinent Vitals/Pain Pain Assessment: Faces Faces Pain Scale: Hurts a little bit Pain Location: Abdomen Pain Descriptors / Indicators: Cramping;Discomfort Pain Intervention(s): Limited activity within patient's tolerance;Monitored during session;Repositioned    Home Living Family/patient expects to be discharged to:: Private residence Living Arrangements: Spouse/significant other Available Help at Discharge: Family;Available 24 hours/day Type of Home: House Home Access: Stairs to enter Entrance Stairs-Rails: Psychiatric nurse of Steps: 3 Home Layout: Two level;Laundry or work area in basement (have togotobasementto getto car) Home Equipment: Grab bars - tub/shower;Hand held Tourist information centre manager - 2 wheels;Shower seat;Cane - quad      Prior Function Level of Independence: Independent         Comments: Pt reports that he performed ADLs and IADLs with no A and use RW occasionally around home     Hand Dominance   Dominant Hand: Right    Extremity/Trunk Assessment   Upper Extremity Assessment Upper Extremity Assessment: Defer to OT evaluation  Lower Extremity Assessment Lower Extremity Assessment: Generalized weakness    Cervical / Trunk Assessment Cervical / Trunk Assessment: Kyphotic  Communication   Communication: No difficulties  Cognition Arousal/Alertness: Awake/alert Behavior During Therapy: WFL for tasks assessed/performed Overall Cognitive  Status: History of cognitive impairments - at baseline                                 General Comments: Pt oriented to self and year. Able to state location with increased time. And unable to recall reason for admission      General Comments General comments (skin integrity, edema, etc.): Pt very pleasant and willing to participate in therapy    Exercises General Exercises - Lower Extremity Ankle Circles/Pumps: AROM;Both;10 reps;Supine Long Arc Quad: AROM;Both;10 reps;Seated Hip Flexion/Marching: AROM;Both;10 reps;Seated   Assessment/Plan    PT Assessment Patient needs continued PT services  PT Problem List Decreased strength;Decreased activity tolerance;Decreased balance;Decreased mobility;Decreased knowledge of use of DME;Decreased safety awareness;Decreased knowledge of precautions;Pain       PT Treatment Interventions DME instruction;Functional mobility training;Therapeutic activities;Stair training;Gait training;Therapeutic exercise;Balance training;Patient/family education    PT Goals (Current goals can be found in the Care Plan section)  Acute Rehab PT Goals Patient Stated Goal: get better PT Goal Formulation: With patient Time For Goal Achievement: 04/19/17 Potential to Achieve Goals: Good    Frequency Min 3X/week   Barriers to discharge Decreased caregiver support (wife can provide supervision only)      Co-evaluation               End of Session Equipment Utilized During Treatment: Gait belt Activity Tolerance: Patient limited by fatigue;Patient limited by pain Patient left: in chair;with call bell/phone within reach;with chair alarm set;with family/visitor present Nurse Communication: Mobility status PT Visit Diagnosis: Unsteadiness on feet (R26.81);Muscle weakness (generalized) (M62.81);Pain Pain - part of body:  (abdomen)    Time: 7121-9758 PT Time Calculation (min) (ACUTE ONLY): 19 min   Charges:   PT Evaluation $PT Eval Moderate  Complexity: 1 Procedure     PT G Codes:        Shayonna Ocampo,PT Acute Rehabilitation [phone]  Denice Paradise 04/05/2017, 1:44 PM

## 2017-04-06 LAB — BASIC METABOLIC PANEL
ANION GAP: 5 (ref 5–15)
BUN: 9 mg/dL (ref 6–20)
CO2: 24 mmol/L (ref 22–32)
Calcium: 7.8 mg/dL — ABNORMAL LOW (ref 8.9–10.3)
Chloride: 114 mmol/L — ABNORMAL HIGH (ref 101–111)
Creatinine, Ser: 0.69 mg/dL (ref 0.61–1.24)
GFR calc Af Amer: >60 mL/min (ref >60)
GLUCOSE: 126 mg/dL — AB (ref 65–99)
POTASSIUM: 3.4 mmol/L — AB (ref 3.5–5.1)
Sodium: 143 mmol/L (ref 135–145)

## 2017-04-06 LAB — CBC
HCT: 36.9 % — ABNORMAL LOW (ref 39.0–52.0)
Hemoglobin: 11.8 g/dL — ABNORMAL LOW (ref 13.0–17.0)
MCH: 30.3 pg (ref 26.0–34.0)
MCHC: 32 g/dL (ref 30.0–36.0)
MCV: 94.9 fL (ref 78.0–100.0)
PLATELETS: 225 10*3/uL (ref 150–400)
RBC: 3.89 MIL/uL — AB (ref 4.22–5.81)
RDW: 13.7 % (ref 11.5–15.5)
WBC: 10.6 10*3/uL — AB (ref 4.0–10.5)

## 2017-04-06 MED FILL — Hydrocodone-Acetaminophen Tab 5-325 MG: ORAL | Qty: 6 | Status: CN

## 2017-04-06 MED FILL — Ondansetron HCl Tab 4 MG: ORAL | Qty: 4 | Status: AC

## 2017-04-06 NOTE — Progress Notes (Signed)
Initial Nutrition Assessment  DOCUMENTATION CODES:   Not applicable  INTERVENTION:   -RD will follow for diet advancement and supplement as appropriate  NUTRITION DIAGNOSIS:   Inadequate oral intake related to altered GI function as evidenced by NPO status.  GOAL:   Patient will meet greater than or equal to 90% of their needs  MONITOR:   Diet advancement, Labs, Weight trends, Skin, I & O's  REASON FOR ASSESSMENT:   Malnutrition Screening Tool    ASSESSMENT:   Pt is a 83 yo M who presents with nausea/vomiting and significant abdominal pain. He has had pain on and off for several months.  This time, his pain has lasted for 4 days and is in the epigastric location.   Pt admitted with gallstone ileus with bowel obstruction   S/p Procedure(s) 04/03/17: EXPLORATORY LAPAROTOMY AND RESECTION OF GALLSTONE ILEUS  Pt sleeping soundly at time of visit with lights off. RD did not disturb. Unable to obtain further nutrition hx or perform nutrition-focused physical exam at this time.   Wt hx reviewed; pt has experienced a 7.8% wt loss over the past 3 months, which is significant for time frame.   Case discussed with RN, who reports pt's delirium and confusion have improved. NGT connected low, intermittent suction- per RN large output (500 ml output over the past 24 hours per doc flowsheets). Plan to continue NPO, decompression, and work with PT.   Labs reviewed: K: 3.4 (on IV supplementation).   Diet Order:  Diet NPO time specified  Skin:  Reviewed, no issues  Last BM:  PTA  Height:   Ht Readings from Last 1 Encounters:  04/03/17 6\' 2"  (1.88 m)    Weight:   Wt Readings from Last 1 Encounters:  04/03/17 178 lb 12.7 oz (81.1 kg)    Ideal Body Weight:  86.4 kg  BMI:  Body mass index is 22.96 kg/m.  Estimated Nutritional Needs:   Kcal:  1700-1900  Protein:  80-95 grams  Fluid:  1.7-1.9 L  EDUCATION NEEDS:   No education needs identified at this  time  Batoul Limes A. Jimmye Norman, RD, LDN, CDE Pager: [phone] After hours Pager: [phone]

## 2017-04-06 NOTE — Progress Notes (Signed)
Pt has been restraint free all day. No attempts to dislodge medical equipment noted. Mentation clear

## 2017-04-06 NOTE — Progress Notes (Signed)
[facility] Progress Note  3 Days Post-Op  Subjective: CC; belching and abdominal pain  Pt states his pain is moderate. He has been belching a lot. No flatus. No nausea.   Objective: Vital signs in last 24 hours: Temp:  [97.5 F (36.4 C)-98.6 F (37 C)] 98.6 F (37 C) (04/17 0532) Pulse Rate:  [73-80] 73 (04/17 0532) Resp:  [19-20] 20 (04/17 0532) BP: (150-157)/(71-75) 150/75 (04/17 0532) SpO2:  [96 %-100 %] 96 % (04/17 0532)    Intake/Output from previous day: 04/16 0701 - 04/17 0700 In: 4375 [I.V.:4375] Out: 2475 [Urine:1500; Emesis/NG output:975] Intake/Output this shift: No intake/output data recorded.  PE: Gen:  Alert, NAD, pleasant, cooperative, well appearing elderly man Card:  RRR, no M/G/R heard Pulm:  CTA, no W/R/R, effort normal Abd: Soft, mildly distended, hypoactive BS with few tinkering BS, incisions C/D/I, mild appropriate TTP Skin: no rashes noted, warm and dry  Lab Results:   Recent Labs  04/05/17 0417 04/06/17 0333  WBC 9.7 10.6*  HGB 11.6* 11.8*  HCT 36.9* 36.9*  PLT 225 225   BMET  Recent Labs  04/05/17 0417 04/06/17 0333  NA 145 143  K 3.5 3.4*  CL 111 114*  CO2 26 24  GLUCOSE 123* 126*  BUN 13 9  CREATININE 0.78 0.69  CALCIUM 7.8* 7.8*   PT/INR No results for input(s): LABPROT, INR in the last 72 hours. CMP     Component Value Date/Time   NA 143 04/06/2017 0333   NA 140 02/17/2017 1153   K 3.4 (L) 04/06/2017 0333   CL 114 (H) 04/06/2017 0333   CO2 24 04/06/2017 0333   GLUCOSE 126 (H) 04/06/2017 0333   BUN 9 04/06/2017 0333   BUN 12 02/17/2017 1153   CREATININE 0.69 04/06/2017 0333   CREATININE 0.87 08/01/2013 0922   CALCIUM 7.8 (L) 04/06/2017 0333   PROT 7.7 04/02/2017 2313   PROT 7.0 02/17/2017 1153   ALBUMIN 3.6 04/02/2017 2313   ALBUMIN 3.8 02/17/2017 1153   AST 30 04/02/2017 2313   ALT 18 04/02/2017 2313   ALKPHOS 67 04/02/2017 2313   BILITOT 1.0 04/02/2017 2313   BILITOT 0.7 02/17/2017 1153   GFRNONAA >60 04/06/2017 0333   GFRNONAA 77 08/01/2013 0922   GFRAA >60 04/06/2017 0333   GFRAA 89 08/01/2013 0922   Lipase     Component Value Date/Time   LIPASE 15 04/03/2017 0036       Studies/Results: Dg Abd Portable 1v  Result Date: 04/05/2017 CLINICAL DATA:  Status post nasogastric tube placement last night. Re-evaluate ileus. EXAM: PORTABLE ABDOMEN - 1 VIEW COMPARISON:  Portable abdominal radiograph of April 03, 2017 FINDINGS: The nasogastric tube tip in proximal port lie in the gastric cardia. The stomach appears decompressed. There remain loops of moderately distended gas-filled small bowel stacked in the left mid and lower abdomen. There is some stool and gas in the transverse colon. No free extraluminal gas is observed. There are degenerative disc changes of the upper lumbar spine. IMPRESSION: Persistent small bowel ileus or partial mid small bowel obstruction. The nasogastric tube tip and proximal port lie in the gastric cardia. Electronically Signed   By: David  Martinique M.D.   On: 04/05/2017 15:24    Anti-infectives: Anti-infectives    Start     Dose/Rate Route Frequency Ordered Stop   04/03/17 0800  cefoTEtan (CEFOTAN) 2 g in dextrose 5 % 50 mL IVPB     2 g 100 mL/hr over 30 Minutes Intravenous  To Short Stay 04/03/17 0424 04/03/17 0902   04/03/17 0754  cefoTEtan in Dextrose 5% (CEFOTAN) 2-2.08 GM-% IVPB    Comments:  Henrine Screws   : cabinet override      04/03/17 0754 04/03/17 1959       Assessment/Plan  BPH  GERD  Hypertension  History prostate cancer  Heart block, Mobitz type I  AKI: resolved, SCr 0.78  gallstone ileus S/p Procedure(s): EXPLORATORY LAPAROTOMY AND RESECTION OF GALLSTONE ILEUS, 4/14, Dr. Grandville Silos. POD #3. Laparotomy, small bowel resection - continue NGT to LIWS, output 975cc in 24hr  - Mobilize out of bed  - Incentive spirometry  - mild leukocytosis, AM labs   FEN: NPO, IVF, NGT LIWS ID: perioperative cefotetan  DVT prophylaxis:  Lovenox   Dispo: Await return of bowel function, PT recommending SNF   LOS: 3 days    Kalman Drape , [facility] 04/06/2017, 9:19 AM Pager: [phone] Consults: [phone] Mon-Fri 7:00 am-4:30 pm Sat-Sun 7:00 am-11:30 am

## 2017-04-06 NOTE — Progress Notes (Signed)
Physical Therapy Treatment Patient Details Name: Alec Davis MRN: [id number] DOB: 07/16/1923 Today's Date: 04/06/2017    History of Present Illness 83 yo M who presents with nausea/vomiting and significant abdominal pain. Underwent exploratory laparotomy and resection of small bowel and gallstone on 04/03/17. NG tube placed 04/04/17. PMHx: Prostate CA, HTN, Rt BBB, depression    PT Comments    Pt admitted with above diagnosis. Pt currently with functional limitations due to the deficits listed below (see PT Problem List). Pt was able to ambulate with min assist ( +2 for chair follow) with RW.  Pt progressing but still needs SNf to reach Modif I level prior to d/c home.  Pt will benefit from skilled PT to increase their independence and safety with mobility to allow discharge to the venue listed below.     Follow Up Recommendations  Supervision/Assistance - 24 hour;SNF     Equipment Recommendations  Other (comment) (TBA)    Recommendations for Other Services       Precautions / Restrictions Precautions Precautions: Fall Precaution Comments: NG tube Restrictions Weight Bearing Restrictions: No    Mobility  Bed Mobility Overal bed mobility: Needs Assistance Bed Mobility: Supine to Sit;Sit to Supine     Supine to sit: Min assist     General bed mobility comments: A to push up trunk into sitting.  Needed cues because he couldn't figure out how to place his UEs to push up without cues.   Transfers Overall transfer level: Needs assistance Equipment used: Rolling walker (2 wheeled) Transfers: Sit to/from Stand Sit to Stand: Min assist;From elevated surface         General transfer comment: Pt stood with min assist and took pivotal steps to recliner with min assist as he c/o left abdomen pain and stated he needed to sit down.  rested a minute and then stood without the pain.   Ambulation/Gait Ambulation/Gait assistance: Min assist;+2 safety/equipment Ambulation Distance  (Feet): 100 Feet Assistive device: Rolling walker (2 wheeled) Gait Pattern/deviations: Step-through pattern;Decreased stride length;Wide base of support;Trunk flexed;Antalgic;Drifts right/left   Gait velocity interpretation: Below normal speed for age/gender General Gait Details: Pt was able to ambulate with RW in hallway today with cues for upright posture and to stay close to RW.  Overall did well.  HAd to sit due to fatigue and followed pt with chair.     Stairs            Wheelchair Mobility    Modified Rankin (Stroke Patients Only)       Balance Overall balance assessment: Needs assistance Sitting-balance support: Feet supported;No upper extremity supported Sitting balance-Leahy Scale: Fair     Standing balance support: No upper extremity supported;Bilateral upper extremity supported Standing balance-Leahy Scale: Poor Standing balance comment: Pt unable to stand without UE support of RW.                             Cognition Arousal/Alertness: Awake/alert Behavior During Therapy: WFL for tasks assessed/performed Overall Cognitive Status: History of cognitive impairments - at baseline                                 General Comments: Pt oriented to self and year. Able to state location with increased time. And unable to recall reason for admission.        Exercises General Exercises - Lower Extremity Ankle Circles/Pumps:  AROM;Both;10 reps;Supine Long Arc Quad: AROM;Both;10 reps;Seated    General Comments        Pertinent Vitals/Pain Pain Assessment: Faces Faces Pain Scale: Hurts even more Pain Location: left upper Abdomen Pain Descriptors / Indicators: Cramping;Discomfort Pain Intervention(s): Limited activity within patient's tolerance;Monitored during session;Repositioned    Home Living                      Prior Function            PT Goals (current goals can now be found in the care plan section) Progress  towards PT goals: Progressing toward goals    Frequency    Min 3X/week      PT Plan Current plan remains appropriate    Co-evaluation             End of Session Equipment Utilized During Treatment: Gait belt Activity Tolerance: Patient limited by fatigue;Patient limited by pain Patient left: in chair;with call bell/phone within reach;with chair alarm set Nurse Communication: Mobility status PT Visit Diagnosis: Unsteadiness on feet (R26.81);Muscle weakness (generalized) (M62.81);Pain Pain - part of body:  (abdomen)     Time: 0981-1914 PT Time Calculation (min) (ACUTE ONLY): 23 min  Charges:  $Gait Training: 8-22 mins $Therapeutic Exercise: 8-22 mins                    G Codes:       Kippy Gohman,PT Acute Rehabilitation [phone] (pager)    Denice Paradise 04/06/2017, 10:23 AM

## 2017-04-07 MED ORDER — BISACODYL 10 MG RE SUPP
10.0000 mg | Freq: Once | RECTAL | Status: AC
  Administered 2017-04-07: 10 mg via RECTAL
  Filled 2017-04-07: qty 1

## 2017-04-07 MED ORDER — DOCUSATE SODIUM 100 MG PO CAPS
100.0000 mg | ORAL_CAPSULE | Freq: Two times a day (BID) | ORAL | Status: DC
  Administered 2017-04-07 – 2017-04-12 (×8): 100 mg via ORAL
  Filled 2017-04-07 (×10): qty 1

## 2017-04-07 MED ORDER — MORPHINE SULFATE (PF) 2 MG/ML IV SOLN: 2.0000 mg | INTRAVENOUS | Status: DC | PRN

## 2017-04-07 NOTE — NC FL2 (Signed)
[place] MEDICAID FL2 LEVEL OF CARE SCREENING TOOL     IDENTIFICATION  Patient Name: Alec Davis Birthdate: Jan 13, 1923 Sex: male Admission Date (Current Location): 04/02/2017  [facility] and [place] Number:  Herbalist and Address:  [facility] [place] [address] [place], [place] [address]      Provider Number: [id number]  Attending Physician Name and Address:  Md Ccs, MD  Relative Name and Phone Number:       Current Level of Care: Hospital Recommended Level of Care: [place] Prior Approval Number:    Date Approved/Denied:   PASRR Number:    Discharge Plan: SNF    Current Diagnoses: Patient Active Problem List   Diagnosis Date Noted  . 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 Heart block 12/18/2016  . Second degree AV block, Mobitz type I 12/17/2016  . Abdominal pain, epigastric 12/17/2016  . Mobitz type 1 second degree atrioventricular block   . Hyperlipemia 08/03/2013  . Depression 08/03/2013  . Vitamin D deficiency 03/07/2011  . GERD (gastroesophageal reflux disease) 03/07/2011  . Prostate cancer ([place]) 01/19/2011  . Essential hypertension 01/19/2011  . Right bundle branch block 01/19/2011  . INTERNAL HEMORRHOIDS 01/19/2011  . DIVERTICULOSIS, COLON 01/19/2011  . COLONIC POLYPS, HX OF 01/19/2011    Orientation RESPIRATION BLADDER Height & Weight     Self, Place  Normal External catheter Weight: 178 lb 12.7 oz (81.1 kg) Height:  6\' 2"  (188 cm)  BEHAVIORAL SYMPTOMS/MOOD NEUROLOGICAL BOWEL NUTRITION STATUS      Continent Diet (NPO time specified Except for: [employer], Sips with Meds, Other )  AMBULATORY STATUS COMMUNICATION OF NEEDS Skin   Limited Assist Verbally Normal                       Personal Care Assistance Level of Assistance  Bathing, Feeding, Dressing Bathing Assistance: Limited assistance Feeding assistance: Independent Dressing Assistance: Limited assistance     Functional Limitations  Info  Sight, Hearing, Speech Sight Info: Adequate Hearing Info: Adequate Speech Info: Adequate    SPECIAL CARE FACTORS FREQUENCY  PT (By licensed PT), OT (By licensed OT)     PT Frequency: 3x OT Frequency: 2x            Contractures Contractures Info: Not present    Additional Factors Info  Code Status, Allergies Code Status Info: Full Allergies Info: No known allergies           Current Medications (04/07/2017):  This is the current hospital active medication list Current Facility-Administered Medications  Medication Dose Route Frequency Provider Last Rate Last Dose  . acetaminophen (TYLENOL) tablet 650 mg  650 mg Oral Q6H PRN Stark Klein, MD       Or  . acetaminophen (TYLENOL) suppository 650 mg  650 mg Rectal Q6H PRN Stark Klein, MD      . bisacodyl (DULCOLAX) suppository 10 mg  10 mg Rectal Once Clovis Riley, MD      . dextrose 5 % and 0.9 % NaCl with KCl 20 mEq/L infusion   Intravenous Continuous Stark Klein, MD 125 mL/hr at 04/07/17 0647    . diphenhydrAMINE (BENADRYL) 12.5 MG/5ML elixir 12.5 mg  12.5 mg Oral Q6H PRN Stark Klein, MD       Or  . diphenhydrAMINE (BENADRYL) injection 12.5 mg  12.5 mg Intravenous Q6H PRN Stark Klein, MD      . docusate sodium (COLACE) capsule 100 mg  100 mg  Oral BID Clovis Riley, MD      . hydrALAZINE (APRESOLINE) injection 10 mg  10 mg Intravenous Q2H PRN Stark Klein, MD      . MEDLINE mouth rinse  15 mL Mouth Rinse BID Fanny Skates, MD   15 mL at 04/06/17 2206  . morphine 2 MG/ML injection 2 mg  2 mg Intravenous Q4H PRN Clovis Riley, MD      . ondansetron (ZOFRAN-ODT) disintegrating tablet 4 mg  4 mg Oral Q6H PRN Stark Klein, MD       Or  . ondansetron (ZOFRAN) injection 4 mg  4 mg Intravenous Q6H PRN Stark Klein, MD      . pantoprazole (PROTONIX) injection 40 mg  40 mg Intravenous QHS Stark Klein, MD   40 mg at 04/06/17 2206  . simethicone (MYLICON) chewable tablet 40 mg  40 mg Oral Q6H PRN Stark Klein, MD       . white petrolatum (VASELINE) gel   Topical PRN Fanny Skates, MD         Discharge Medications: Please see discharge summary for a list of discharge medications.  Relevant Imaging Results:  Relevant Lab Results:   Additional Information SSN: [id number]  Truitt Merle, LCSW

## 2017-04-07 NOTE — Clinical Social Work Note (Signed)
Clinical Social Work Assessment  Patient Details  Name: Alec Davis MRN: [id number] Date of Birth: [birth year]  Date of referral:  04/06/17               Reason for consult:  Discharge Planning                Permission sought to share information with:  Family Supports, Facility Sport and exercise psychologist Permission granted to share information::     Name::     Taim Wurm  Agency::  SNFs  Relationship::  Spouse  Contact Information:  [phone]  Housing/Transportation Living arrangements for the past 2 months:  Single Family Home Source of Information:  Spouse, Adult Children Patient Interpreter Needed:  None Criminal Activity/Legal Involvement Pertinent to Current Situation/Hospitalization:  No - Comment as needed Significant Relationships:  Adult Children, Spouse Lives with:  Spouse Do you feel safe going back to the place where you live?  Yes Need for family participation in patient care:  Yes (Comment)  Care giving concerns:  No caregiving concerns identified.    Social Worker assessment / plan:  CSW met with pt to address consult for new SNF. Pt sleeping soundly and A&Ox2. Daughter-Paula and Spouse-[name] present at bedside. CSW introduced herself and explained role of social work. Pt from home and lives with spouse. Spouse and dtr looking into IDL for spouse and pt. P/T is recommending STR at SNF. CSW explained discharging to SNF with a Medicare. CSW provided SNF listing for review. Spouse reports pt agreeable to SNF for STR.  Preferred facility is [facility], but agreed for CSW to fax out to [facility].    CSW sent FL-2 to SNFs and will follow up on potential bed offer. CSW will continue to follow.   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   Patient/Family's Understanding of and Emotional Response to Diagnosis, Current Treatment, and Prognosis:  Family understands pt will benefit from STR prior to returning home and is accepting of his diagnosis, current treatment plan, and prognosis.    Emotional Assessment Appearance:  Appears stated age Attitude/Demeanor/Rapport:  Unable to Assess Affect (typically observed):  Unable to Assess Orientation:  Oriented to Self, Oriented to Place Alcohol / Substance use:  Other Psych involvement (Current and /or in the community):  No (Comment)  Discharge Needs  Concerns to be addressed:  Care Coordination Readmission within the last 30 days:  No Current discharge risk:  Dependent with Mobility Barriers to Discharge:  Continued Medical Work up   [employer], LCSW 04/07/2017, 7:34 AM

## 2017-04-07 NOTE — Care Management Important Message (Signed)
Important Message  Patient Details  Name: Alec Davis MRN: [id number] Date of Birth: 28-Nov-1923   Medicare Important Message Given:  Yes    [name] [name] 04/07/2017, 12:37 PM

## 2017-04-07 NOTE — Progress Notes (Signed)
[facility] Progress Note  4 Days Post-Op  Subjective: CC; no complaints  No pain this morning. Pulled his NG around 04:30. No belching, nausea or hiccups since. Reports flatus and requesting liquids   Objective: Vital signs in last 24 hours: Temp:  [97.5 F (36.4 C)-98.5 F (36.9 C)] 98.4 F (36.9 C) (04/18 0605) Pulse Rate:  [62-75] 73 (04/18 0605) Resp:  [18-20] 18 (04/18 0605) BP: (132-158)/(70-90) 158/72 (04/18 0605) SpO2:  [97 %-99 %] 98 % (04/18 0605)    Intake/Output from previous day: 04/17 0701 - 04/18 0700 In: 2185 [P.O.:60; I.V.:2125] Out: 2595 [Urine:1975; Emesis/NG output:620] Intake/Output this shift: Total I/O In: 2125 [I.V.:2125] Out: 1645 [Urine:1425; Emesis/NG output:220]  PE: Gen:  Alert, NAD, pleasant, cooperative, well appearing elderly man Card:  RRR, no M/G/R heard Pulm:  CTA, no W/R/R, effort normal Abd: Soft, mildly distended, incision C/D/I, no tenderness Skin: no rashes noted, warm and dry  Lab Results:   Recent Labs  04/05/17 0417 04/06/17 0333  WBC 9.7 10.6*  HGB 11.6* 11.8*  HCT 36.9* 36.9*  PLT 225 225   BMET  Recent Labs  04/05/17 0417 04/06/17 0333  NA 145 143  K 3.5 3.4*  CL 111 114*  CO2 26 24  GLUCOSE 123* 126*  BUN 13 9  CREATININE 0.78 0.69  CALCIUM 7.8* 7.8*   PT/INR No results for input(s): LABPROT, INR in the last 72 hours. CMP     Component Value Date/Time   NA 143 04/06/2017 0333   NA 140 02/17/2017 1153   K 3.4 (L) 04/06/2017 0333   CL 114 (H) 04/06/2017 0333   CO2 24 04/06/2017 0333   GLUCOSE 126 (H) 04/06/2017 0333   BUN 9 04/06/2017 0333   BUN 12 02/17/2017 1153   CREATININE 0.69 04/06/2017 0333   CREATININE 0.87 08/01/2013 0922   CALCIUM 7.8 (L) 04/06/2017 0333   PROT 7.7 04/02/2017 2313   PROT 7.0 02/17/2017 1153   ALBUMIN 3.6 04/02/2017 2313   ALBUMIN 3.8 02/17/2017 1153   AST 30 04/02/2017 2313   ALT 18 04/02/2017 2313   ALKPHOS 67 04/02/2017 2313   BILITOT 1.0  04/02/2017 2313   BILITOT 0.7 02/17/2017 1153   GFRNONAA >60 04/06/2017 0333   GFRNONAA 77 08/01/2013 0922   GFRAA >60 04/06/2017 0333   GFRAA 89 08/01/2013 0922   Lipase     Component Value Date/Time   LIPASE 15 04/03/2017 0036       Studies/Results: Dg Abd Portable 1v  Result Date: 04/05/2017 CLINICAL DATA:  Status post nasogastric tube placement last night. Re-evaluate ileus. EXAM: PORTABLE ABDOMEN - 1 VIEW COMPARISON:  Portable abdominal radiograph of April 03, 2017 FINDINGS: The nasogastric tube tip in proximal port lie in the gastric cardia. The stomach appears decompressed. There remain loops of moderately distended gas-filled small bowel stacked in the left mid and lower abdomen. There is some stool and gas in the transverse colon. No free extraluminal gas is observed. There are degenerative disc changes of the upper lumbar spine. IMPRESSION: Persistent small bowel ileus or partial mid small bowel obstruction. The nasogastric tube tip and proximal port lie in the gastric cardia. Electronically Signed   By: David  Martinique M.D.   On: 04/05/2017 15:24    Anti-infectives: Anti-infectives    Start     Dose/Rate Route Frequency Ordered Stop   04/03/17 0800  cefoTEtan (CEFOTAN) 2 g in dextrose 5 % 50 mL IVPB     2 g 100 mL/hr over  30 Minutes Intravenous To Short Stay 04/03/17 0424 04/03/17 0902   04/03/17 0754  cefoTEtan in Dextrose 5% (CEFOTAN) 2-2.08 GM-% IVPB    Comments:  Henrine Screws   : cabinet override      04/03/17 0754 04/03/17 1959       Assessment/Plan  BPH  GERD  Hypertension  History prostate cancer  Heart block, Mobitz type I  AKI: resolved, SCr 0.78  gallstone ileus S/p Procedure(s): EXPLORATORY LAPAROTOMY AND RESECTION OF GALLSTONE ILEUS, 4/14, Dr. Grandville Silos. POD #4. Laparotomy, small bowel resection - Leave NG out for now. OK for limited sips of clears/ice.  -Add colace, give suppository today - Mobilize out of bed, has not ambulated yet and really  needs to  - Incentive spirometry  - check labs in AM including K and Mag which may need to be replaced; minimize narcotics  FEN: NPO with sips, IVF ID: perioperative cefotetan  DVT prophylaxis: Lovenox   Dispo: Await return of bowel function, PT recommending SNF   LOS: 4 days    Clovis Riley , MD [facility] 04/07/2017, 6:32 AM

## 2017-04-07 NOTE — Progress Notes (Signed)
Occupational Therapy Treatment Patient Details Name: Alec Davis MRN: [id number] DOB: 04/08/1923 Today's Date: 04/07/2017    History of present illness 83 yo M who presents with nausea/vomiting and significant abdominal pain. Underwent exploratory laparotomy and resection of small bowel and gallstone on 04/03/17. NG tube placed 04/04/17. PMHx: Prostate CA, HTN, Rt BBB, depression   OT comments  Pt progressing towards established goals. Pt performed toilet transfer to [facility] and completed toilet hygiene with Min A to stabilize with weight shifting in standing and to finish cleaning. After a rest break, pt performed functional mobility to sink and completed hand hygiene with Min guard - Min A using RW. Pt continues to fatigue quickly demonstrating decreased activity and standing tolerance. Continue to recommend dc to SNF for further OT and will continue to follow acutely.     Follow Up Recommendations  SNF;Supervision/Assistance - 24 hour    Equipment Recommendations  Other (comment) (Defer to next venue)    Recommendations for Other Services PT consult    Precautions / Restrictions Precautions Precautions: Fall       Mobility Bed Mobility Overal bed mobility: Needs Assistance Bed Mobility: Rolling;Sidelying to Sit Rolling: Min guard Sidelying to sit: Min assist (Very Minimal physical A. Benefited from increased VCs)       General bed mobility comments: Pt benefited from increased VCs to sequence sidelying to sit.   Transfers Overall transfer level: Needs assistance Equipment used: Rolling walker (2 wheeled) Transfers: Sit to/from Stand Sit to Stand: Min assist Stand pivot transfers: Min assist       General transfer comment: Pt required increased VCs to sequence. Demonstrating increased acitvity tolerance though still fatigues easily    Balance Overall balance assessment: Needs assistance Sitting-balance support: Feet supported;No upper extremity supported Sitting  balance-Leahy Scale: Fair     Standing balance support: No upper extremity supported;Bilateral upper extremity supported Standing balance-Leahy Scale: Fair Standing balance comment: Pt maintained standing balance to perform toilet hygiene and wash hands at sink without need for UE support. Benefited from [place] guard - Min A for safety for dynamic weight shifting                           ADL either performed or assessed with clinical judgement   ADL Overall ADL's : Needs assistance/impaired     Grooming: Min guard;Wash/dry hands;Standing;Cueing for sequencing Grooming Details (indicate cue type and reason): Pt required increased VCs to sequence task possibly due to abnormal environment and HOH making it hard to follow commands                 Toilet Transfer: Stand-pivot;Minimal assistance;Cueing for safety;BSC Toilet Transfer Details (indicate cue type and reason): Required Min A for balance due to urgency Toileting- Clothing Manipulation and Hygiene: Minimal assistance;Sit to/from stand Toileting - Clothing Manipulation Details (indicate cue type and reason): Pt performed toilet hygiene in standing. He required Min A to finish cleaning self after BM. ALso required Min guard-Min A for standing balance             Vision       Perception     Praxis      Cognition Arousal/Alertness: Awake/alert Behavior During Therapy: WFL for tasks assessed/performed Overall Cognitive Status: History of cognitive impairments - at baseline  Exercises     Shoulder Instructions       General Comments Pt wife and daughter present for session    Pertinent Vitals/ Pain       Pain Assessment: Faces Faces Pain Scale: Hurts a little bit Pain Location: left upper Abdomen Pain Descriptors / Indicators: Cramping;Discomfort Pain Intervention(s): Monitored during session  Home Living                                           Prior Functioning/Environment              Frequency  Min 2X/week        Progress Toward Goals  OT Goals(current goals can now be found in the care plan section)  Progress towards OT goals: Progressing toward goals  Acute Rehab OT Goals Patient Stated Goal: get better OT Goal Formulation: With patient Time For Goal Achievement: 04/19/17 Potential to Achieve Goals: Good  Plan Discharge plan remains appropriate    Co-evaluation                 End of Session Equipment Utilized During Treatment: Gait belt;Rolling walker  OT Visit Diagnosis: Unsteadiness on feet (R26.81);Muscle weakness (generalized) (M62.81)   Activity Tolerance Patient tolerated treatment well;Patient limited by fatigue   Patient Left in chair;with call bell/phone within reach;with chair alarm set;with family/visitor present   Nurse Communication Mobility status;Other (comment) (Pt soiled bed prior to session)        Time: 3154-0086 OT Time Calculation (min): 29 min  Charges: OT General Charges $OT Visit: 1 Procedure OT Treatments $Self Care/Home Management : 23-37 mins  [place], OTR/L [place] 04/07/2017, 2:46 PM

## 2017-04-07 NOTE — Care Management Note (Signed)
Case Management Note  Patient Details  Name: Alec Davis MRN: [id number] Date of Birth: February 21, 1923  Subjective/Objective:                    Action/Plan:  DC to SNF as facilitated by CSW.  Expected Discharge Date:                  Expected Discharge Plan:  Skilled Nursing Facility  In-House Referral:  Clinical Social Work  Discharge planning Services     Post Acute Care Choice:    Choice offered to:     DME Arranged:    DME Agency:     HH Arranged:    [facility] Agency:     Status of Service:  Completed, signed off  If discussed at [employer], dates discussed:    Additional Comments:  Carles Collet, RN 04/07/2017, 10:40 AM

## 2017-04-08 ENCOUNTER — Telehealth: Payer: Self-pay | Admitting: Family Medicine

## 2017-04-08 LAB — BASIC METABOLIC PANEL
Anion gap: 5 (ref 5–15)
BUN: 6 mg/dL (ref 6–20)
CALCIUM: 7.6 mg/dL — AB (ref 8.9–10.3)
CO2: 24 mmol/L (ref 22–32)
Chloride: 106 mmol/L (ref 101–111)
Creatinine, Ser: 0.73 mg/dL (ref 0.61–1.24)
GFR calc Af Amer: >60 mL/min (ref >60)
Glucose, Bld: 118 mg/dL — ABNORMAL HIGH (ref 65–99)
POTASSIUM: 3.9 mmol/L (ref 3.5–5.1)
SODIUM: 135 mmol/L (ref 135–145)

## 2017-04-08 LAB — URINALYSIS, ROUTINE W REFLEX MICROSCOPIC
BILIRUBIN URINE: NEGATIVE
Glucose, UA: NEGATIVE mg/dL
Ketones, ur: NEGATIVE mg/dL
LEUKOCYTES UA: NEGATIVE
Nitrite: NEGATIVE
PH: 6 (ref 5.0–8.0)
Protein, ur: NEGATIVE mg/dL
SPECIFIC GRAVITY, URINE: 1.011 (ref 1.005–1.030)
SQUAMOUS EPITHELIAL / LPF: NONE SEEN

## 2017-04-08 LAB — CBC
HCT: 36.7 % — ABNORMAL LOW (ref 39.0–52.0)
Hemoglobin: 12.1 g/dL — ABNORMAL LOW (ref 13.0–17.0)
MCH: 30.3 pg (ref 26.0–34.0)
MCHC: 33 g/dL (ref 30.0–36.0)
MCV: 91.8 fL (ref 78.0–100.0)
PLATELETS: 237 10*3/uL (ref 150–400)
RBC: 4 MIL/uL — AB (ref 4.22–5.81)
RDW: 13.3 % (ref 11.5–15.5)
WBC: 13.9 10*3/uL — ABNORMAL HIGH (ref 4.0–10.5)

## 2017-04-08 LAB — MAGNESIUM: MAGNESIUM: 1.6 mg/dL — AB (ref 1.7–2.4)

## 2017-04-08 MED ORDER — HYDROCODONE-ACETAMINOPHEN 5-325 MG PO TABS
1.0000 | ORAL_TABLET | ORAL | Status: DC | PRN
  Administered 2017-04-08 – 2017-04-10 (×3): 1 via ORAL
  Filled 2017-04-08 (×3): qty 1

## 2017-04-08 MED ORDER — BOOST / RESOURCE BREEZE PO LIQD
1.0000 | Freq: Two times a day (BID) | ORAL | Status: DC
  Administered 2017-04-09 – 2017-04-10 (×3): 1 via ORAL

## 2017-04-08 MED ORDER — MAGNESIUM OXIDE 400 (241.3 MG) MG PO TABS
400.0000 mg | ORAL_TABLET | Freq: Once | ORAL | Status: AC
  Administered 2017-04-08: 400 mg via ORAL
  Filled 2017-04-08: qty 1

## 2017-04-08 NOTE — Telephone Encounter (Signed)
Please call patient with recommendations regarding her dad

## 2017-04-08 NOTE — Progress Notes (Signed)
Physical Therapy Treatment Patient Details Name: Alec Davis MRN: [id number] DOB: [birth year] Today's Date: 04/08/2017    History of Present Illness 83 yo M who presents with nausea/vomiting and significant abdominal pain. Underwent exploratory laparotomy and resection of small bowel and gallstone on 04/03/17. NG tube placed 04/04/17. PMHx: Prostate CA, HTN, Rt BBB, depression    PT Comments    Pt admitted with above diagnosis. Pt currently with functional limitations due to balance and endurance deficits. Pt was able to get to 3N1 and back to chair only.  Replaced the sacral allevyn as the other one was in wrong place.  Pt too weak to walk in halls.  Will continue to progress as able.  Pt will benefit from skilled PT to increase their independence and safety with mobility to allow discharge to the venue listed below.     Follow Up Recommendations  Supervision/Assistance - 24 hour;SNF     Equipment Recommendations  Other (comment) (TBA)    Recommendations for Other Services       Precautions / Restrictions Precautions Precautions: Fall Restrictions Weight Bearing Restrictions: No    Mobility  Bed Mobility               General bed mobility comments: in chair on arrival  Transfers Overall transfer level: Needs assistance Equipment used: Rolling walker (2 wheeled) Transfers: Sit to/from Stand Sit to Stand: Min assist;Mod assist Stand pivot transfers: Min assist       General transfer comment: Pt required increased VCs to sequence. did not feel well and wanted to get on 3N1.  Pivoted to the 3N1 and went a little.  Flexed posture and took incr time and mod assist to rise from low recliner.   Once done, too weak to ambualate per pt.  Got back to recliner and lunch had come.   Ambulation/Gait                 Stairs            Wheelchair Mobility    Modified Rankin (Stroke Patients Only)       Balance           Standing balance support:  Bilateral upper extremity supported;During functional activity Standing balance-Leahy Scale: Poor Standing balance comment: relies on RW for balance                             Cognition Arousal/Alertness: Awake/alert Behavior During Therapy: WFL for tasks assessed/performed Overall Cognitive Status: History of cognitive impairments - at baseline                                 General Comments: Pt oriented to self and year. Able to state location with increased time. And unable to recall reason for admission.        Exercises General Exercises - Lower Extremity Ankle Circles/Pumps: AROM;Both;10 reps;Supine Long Arc Quad: AROM;Both;10 reps;Seated    General Comments General comments (skin integrity, edema, etc.): wife and daughter in room.       Pertinent Vitals/Pain Pain Assessment: Faces Faces Pain Scale: Hurts even more Pain Location: left upper Abdomen Pain Descriptors / Indicators: Cramping;Discomfort Pain Intervention(s): Limited activity within patient's tolerance;Monitored during session;Repositioned;Patient requesting pain meds-RN notified    Home Living  Prior Function            PT Goals (current goals can now be found in the care plan section) Progress towards PT goals: Progressing toward goals    Frequency    Min 3X/week      PT Plan Current plan remains appropriate    Co-evaluation             End of Session Equipment Utilized During Treatment: Gait belt Activity Tolerance: Patient limited by fatigue;Patient limited by pain Patient left: in chair;with call bell/phone within reach;with chair alarm set;with family/visitor present Nurse Communication: Mobility status;Patient requests pain meds PT Visit Diagnosis: Unsteadiness on feet (R26.81);Muscle weakness (generalized) (M62.81);Pain Pain - part of body:  (abdomen)     Time: 4696-2952 PT Time Calculation (min) (ACUTE ONLY): 28  min  Charges:  $Therapeutic Activity: 8-22 mins $Self Care/Home Management: 8-22                    G Codes:       Alec Davis,PT Acute Rehabilitation [phone] (pager)    Alec Davis 04/08/2017, 2:37 PM

## 2017-04-08 NOTE — Progress Notes (Signed)
Nutrition Follow-up  DOCUMENTATION CODES:   Not applicable  INTERVENTION:   -Boost Breeze po BID, each supplement provides 250 kcal and 9 grams of protein  NUTRITION DIAGNOSIS:   Inadequate oral intake related to altered GI function as evidenced by  (NPO/CLD).  Ongoing  GOAL:   Patient will meet greater than or equal to 90% of their needs  Progressing  MONITOR:   Diet advancement, Labs, Weight trends, Skin, I & O's  REASON FOR ASSESSMENT:   Malnutrition Screening Tool    ASSESSMENT:   Pt is a 83 yo M who presents with nausea/vomiting and significant abdominal pain. He has had pain on and off for several months.  This time, his pain has lasted for 4 days and is in the epigastric location.   S/p Procedure(s) 04/03/17: EXPLORATORY LAPAROTOMY AND RESECTION OF GALLSTONE ILEUS  NGT removed by pt on 04/07/17. Pt advanced to clear liquid diet this AM.   Spoke with pt, who reports good tolerance of clear liquids; he really enjoyed chicken broth. Denies any nausea, vomiting, or abdominal discomfort with eating. He shares that he was working on intentional weight loss, stating "I was getting too big", however, suspects he lost "a few pounds" related to acute illness.   Nutrition-Focused physical exam completed. Findings are no fat depletion, mild muscle depletion, and no edema.   Pt was intermittently confused at time of visit. Towards end of visit, pt started answering some questions inappropriately, but was able to be redirected. Discussed importance of good PO intake to support healing.   Labs reviewed.  Diet Order:  Diet clear liquid Room service appropriate? Yes; Fluid consistency: Thin  Skin:  Reviewed, no issues  Last BM:  04/08/17  Height:   Ht Readings from Last 1 Encounters:  04/03/17 6\' 2"  (1.88 m)    Weight:   Wt Readings from Last 1 Encounters:  04/03/17 178 lb 12.7 oz (81.1 kg)    Ideal Body Weight:  86.4 kg  BMI:  Body mass index is 22.96  kg/m.  Estimated Nutritional Needs:   Kcal:  1700-1900  Protein:  80-95 grams  Fluid:  1.7-1.9 L  EDUCATION NEEDS:   No education needs identified at this time  Artemis Koller A. Jimmye Norman, RD, LDN, CDE Pager: [phone] After hours Pager: [phone]

## 2017-04-08 NOTE — Telephone Encounter (Signed)
Returned [employer] phone call regarding patient.   [name] states that Dr. Laurance Flatten left a message on Paula's phone to contact this office for instructions.

## 2017-04-08 NOTE — Progress Notes (Signed)
5 Days Post-Op  Subjective: oob, feels fine, alert/oriented, having flatus and liquid stools he says, no n/v, has taken some ice  Objective: Vital signs in last 24 hours: Temp:  [97.5 F (36.4 C)-98.4 F (36.9 C)] 97.5 F (36.4 C) (04/19 0521) Pulse Rate:  [75-78] 78 (04/19 0521) Resp:  [17-18] 18 (04/19 0521) BP: (141-153)/(68-79) 142/69 (04/19 0521) SpO2:  [96 %-99 %] 96 % (04/19 0521) Last BM Date: 04/07/17  Intake/Output from previous day: 04/18 0701 - 04/19 0700 In: 1516.3 [P.O.:60; I.V.:1456.3] Out: 2150 [Urine:2150] Intake/Output this shift: No intake/output data recorded.  Resp: clear to auscultation bilaterally Cardio: regular rate and rhythm GI: bs present approp tender dressing intact  Lab Results:   Recent Labs  04/06/17 0333 04/08/17 0408  WBC 10.6* 13.9*  HGB 11.8* 12.1*  HCT 36.9* 36.7*  PLT 225 237   BMET  Recent Labs  04/06/17 0333 04/08/17 0408  NA 143 135  K 3.4* 3.9  CL 114* 106  CO2 24 24  GLUCOSE 126* 118*  BUN 9 6  CREATININE 0.69 0.73  CALCIUM 7.8* 7.6*   PT/INR No results for input(s): LABPROT, INR in the last 72 hours. ABG No results for input(s): PHART, HCO3 in the last 72 hours.  Invalid input(s): PCO2, PO2  Studies/Results: No results found.  Anti-infectives: Anti-infectives    Start     Dose/Rate Route Frequency Ordered Stop   04/03/17 0800  cefoTEtan (CEFOTAN) 2 g in dextrose 5 % 50 mL IVPB     2 g 100 mL/hr over 30 Minutes Intravenous To Short Stay 04/03/17 0424 04/03/17 0902   04/03/17 0754  cefoTEtan in Dextrose 5% (CEFOTAN) 2-2.08 GM-% IVPB    Comments:  Henrine Screws   : cabinet override      04/03/17 0754 04/03/17 1959      Assessment/Plan: gallstone ileus EXPLORATORY LAPAROTOMY AND RESECTION OF GALLSTONE ILEUS, 4/14, Dr. Grandville Silos. POD #4. Laparotomy, small bowel resection - will give clears today, see how he tolerates this - cd foley catheter NOM:VEHMCNOB ivf, replete mg orally ID: wbc up a little,  will send ua today, otherwise follow DVT prophylaxis: Lovenox   Dispo: Await return of bowel function, PT recommending SNF  [facility] 04/08/2017

## 2017-04-09 LAB — CBC
HCT: 34.7 % — ABNORMAL LOW (ref 39.0–52.0)
Hemoglobin: 12 g/dL — ABNORMAL LOW (ref 13.0–17.0)
MCH: 31.3 pg (ref 26.0–34.0)
MCHC: 34.6 g/dL (ref 30.0–36.0)
MCV: 90.4 fL (ref 78.0–100.0)
PLATELETS: 236 10*3/uL (ref 150–400)
RBC: 3.84 MIL/uL — ABNORMAL LOW (ref 4.22–5.81)
RDW: 13.5 % (ref 11.5–15.5)
WBC: 12.5 10*3/uL — AB (ref 4.0–10.5)

## 2017-04-09 MED ORDER — PANTOPRAZOLE SODIUM 40 MG PO TBEC
40.0000 mg | DELAYED_RELEASE_TABLET | Freq: Every day | ORAL | Status: DC
  Administered 2017-04-09 – 2017-04-11 (×3): 40 mg via ORAL
  Filled 2017-04-09 (×3): qty 1

## 2017-04-09 MED ORDER — MAGNESIUM OXIDE 400 (241.3 MG) MG PO TABS
200.0000 mg | ORAL_TABLET | Freq: Two times a day (BID) | ORAL | Status: AC
Start: 2017-04-09 — End: 2017-04-10
  Administered 2017-04-09 – 2017-04-10 (×3): 200 mg via ORAL
  Administered 2017-04-10: 100 mg via ORAL
  Filled 2017-04-09 (×4): qty 1

## 2017-04-09 MED ORDER — POLYETHYLENE GLYCOL 3350 17 G PO PACK
17.0000 g | PACK | Freq: Every day | ORAL | Status: DC
  Administered 2017-04-09 – 2017-04-11 (×3): 17 g via ORAL
  Filled 2017-04-09 (×4): qty 1

## 2017-04-09 NOTE — Progress Notes (Signed)
Physical Therapy Treatment Patient Details Name: Alec Davis MRN: [id number] DOB: June 30, 1923 Today's Date: 04/09/2017    History of Present Illness 83 yo M who presents with nausea/vomiting and significant abdominal pain. Underwent exploratory laparotomy and resection of small bowel and gallstone on 04/03/17. NG tube placed 04/04/17. PMHx: Prostate CA, HTN, Rt BBB, depression    PT Comments    Patient limited by fatigue however willing to participate in therapy. Pt encouraged to ambulate throughout the day with nursing staff.  Continue to progress as tolerated with anticipated d/c to SNF for further skilled PT services.    Follow Up Recommendations  Supervision/Assistance - 24 hour;SNF     Equipment Recommendations  Other (comment) (TBA)    Recommendations for Other Services       Precautions / Restrictions Precautions Precautions: Fall Restrictions Weight Bearing Restrictions: No    Mobility  Bed Mobility               General bed mobility comments: pt OOB in chair upon arrival  Transfers Overall transfer level: Needs assistance Equipment used: Rolling walker (2 wheeled) Transfers: Sit to/from Stand Sit to Stand: Min assist         General transfer comment: cues for scooting forward in chair and safe hand placement prior to attempting stand; assist to power up into standing and to steady   Ambulation/Gait Ambulation/Gait assistance: Min assist Ambulation Distance (Feet): 70 Feet Assistive device: Rolling walker (2 wheeled) Gait Pattern/deviations: Step-through pattern;Decreased stride length;Trunk flexed;Wide base of support     General Gait Details: cues for posture, cadence, and safe proximity of RW; pt c/o fatigue and required brief standing rest breaks   Stairs            Wheelchair Mobility    Modified Rankin (Stroke Patients Only)       Balance     Sitting balance-Leahy Scale: Fair     Standing balance support: Bilateral upper  extremity supported;During functional activity Standing balance-Leahy Scale: Poor Standing balance comment: relies on RW for balance                             Cognition Arousal/Alertness: Awake/alert Behavior During Therapy: WFL for tasks assessed/performed Overall Cognitive Status: History of cognitive impairments - at baseline                                        Exercises      General Comments General comments (skin integrity, edema, etc.): family and friends present      Pertinent Vitals/Pain Pain Assessment: No/denies pain Pain Descriptors / Indicators:  (pt reported feeling uncomfortable due to fatigue) Pain Intervention(s): Limited activity within patient's tolerance    Home Living                      Prior Function            PT Goals (current goals can now be found in the care plan section) Acute Rehab PT Goals Patient Stated Goal: get better Progress towards PT goals: Progressing toward goals    Frequency    Min 3X/week      PT Plan Current plan remains appropriate    Co-evaluation             End of Session Equipment Utilized During Treatment: Gait belt Activity  Tolerance: Patient limited by fatigue Patient left: in chair;with call bell/phone within reach;with chair alarm set;with family/visitor present Nurse Communication: Mobility status PT Visit Diagnosis: Unsteadiness on feet (R26.81);Muscle weakness (generalized) (M62.81);Pain Pain - part of body:  (abdomen)     Time: 0379-4446 PT Time Calculation (min) (ACUTE ONLY): 13 min  Charges:  $Gait Training: 8-22 mins                    G Codes:       Earney Navy, PTA Pager: [phone]     Darliss Cheney 04/09/2017, 3:39 PM

## 2017-04-09 NOTE — Progress Notes (Signed)
[facility] Progress Note  6 Days Post-Op  Subjective: Pt is feeling good. He states minimal abdominal pain. He is having flatus and a small BM yesterday. No nausea or vomiting. No fever or chills. He was drinking his morning coffee and reading the paper.  Objective: Vital signs in last 24 hours: Temp:  [97.5 F (36.4 C)-98.2 F (36.8 C)] 97.9 F (36.6 C) (04/20 0434) Pulse Rate:  [77-88] 88 (04/20 0434) Resp:  [18-20] 19 (04/20 0434) BP: (110-145)/(54-82) 145/72 (04/20 0434) SpO2:  [97 %-100 %] 100 % (04/20 0434) Last BM Date: 04/08/17  Intake/Output from previous day: 04/19 0701 - 04/20 0700 In: 1893.8 [P.O.:480; I.V.:1413.8] Out: 725 [Urine:725] Intake/Output this shift: No intake/output data recorded.  PE: Gen:  Alert, NAD, pleasant, cooperative, well appearing Card:  RRR, no M/G/R heard Pulm:  CTA, no W/R/R, effort normal Abd: Soft, not distended, +BS, midline incisions with staples and without surrounding erythema, edema or discharge, mild TTP around incision Skin: no rashes noted, warm and dry  Lab Results:   Recent Labs  04/08/17 0408 04/08/17 2352  WBC 13.9* 12.5*  HGB 12.1* 12.0*  HCT 36.7* 34.7*  PLT 237 236   BMET  Recent Labs  04/08/17 0408  NA 135  K 3.9  CL 106  CO2 24  GLUCOSE 118*  BUN 6  CREATININE 0.73  CALCIUM 7.6*   PT/INR No results for input(s): LABPROT, INR in the last 72 hours. CMP     Component Value Date/Time   NA 135 04/08/2017 0408   NA 140 02/17/2017 1153   K 3.9 04/08/2017 0408   CL 106 04/08/2017 0408   CO2 24 04/08/2017 0408   GLUCOSE 118 (H) 04/08/2017 0408   BUN 6 04/08/2017 0408   BUN 12 02/17/2017 1153   CREATININE 0.73 04/08/2017 0408   CREATININE 0.87 08/01/2013 0922   CALCIUM 7.6 (L) 04/08/2017 0408   PROT 7.7 04/02/2017 2313   PROT 7.0 02/17/2017 1153   ALBUMIN 3.6 04/02/2017 2313   ALBUMIN 3.8 02/17/2017 1153   AST 30 04/02/2017 2313   ALT 18 04/02/2017 2313   ALKPHOS 67 04/02/2017  2313   BILITOT 1.0 04/02/2017 2313   BILITOT 0.7 02/17/2017 1153   GFRNONAA >60 04/08/2017 0408   GFRNONAA 77 08/01/2013 0922   GFRAA >60 04/08/2017 0408   GFRAA 89 08/01/2013 0922   Lipase     Component Value Date/Time   LIPASE 15 04/03/2017 0036       Studies/Results: No results found.  Anti-infectives: Anti-infectives    Start     Dose/Rate Route Frequency Ordered Stop   04/03/17 0800  cefoTEtan (CEFOTAN) 2 g in dextrose 5 % 50 mL IVPB     2 g 100 mL/hr over 30 Minutes Intravenous To Short Stay 04/03/17 0424 04/03/17 0902   04/03/17 0754  cefoTEtan in Dextrose 5% (CEFOTAN) 2-2.08 GM-% IVPB    Comments:  Henrine Screws   : cabinet override      04/03/17 0754 04/03/17 1959       Assessment/Plan gallstone ileus EXPLORATORY LAPAROTOMY AND RESECTION OF GALLSTONE ILEUS, 4/14, Dr. Grandville Silos. POD #6. Laparotomy, small bowel resection  FEN: Clears and advance to fulls later today, replete mg orally for 2 days with AM labs Sat and Sun ID: wbc trending down, ua neg, daily CBC DVT prophylaxis: Lovenox   Dispo: encourage ambulation, IS, advance diet as tolerated and SNF at discharge,    LOS: 6 days    Kalman Drape ,  [facility] 04/09/2017, 9:23 AM Pager: [phone] Consults: [phone] Mon-Fri 7:00 am-4:30 pm Sat-Sun 7:00 am-11:30 am

## 2017-04-09 NOTE — Clinical Social Work Note (Signed)
Pt will d/c to [facility] when medically stable. CSW continues to follow for discharge needs.   Oretha Ellis, [place], [place] Work [phone]

## 2017-04-09 NOTE — Care Management Important Message (Signed)
Important Message  Patient Details  Name: Alec Davis MRN: [id number] Date of Birth: Jun 11, 1923   Medicare Important Message Given:  Yes    Marco Adelson 04/09/2017, 3:15 PM

## 2017-04-10 LAB — CBC
HEMATOCRIT: 37.7 % — AB (ref 39.0–52.0)
HEMOGLOBIN: 12.5 g/dL — AB (ref 13.0–17.0)
MCH: 30 pg (ref 26.0–34.0)
MCHC: 33.2 g/dL (ref 30.0–36.0)
MCV: 90.6 fL (ref 78.0–100.0)
Platelets: 244 10*3/uL (ref 150–400)
RBC: 4.16 MIL/uL — ABNORMAL LOW (ref 4.22–5.81)
RDW: 13.4 % (ref 11.5–15.5)
WBC: 11.1 10*3/uL — AB (ref 4.0–10.5)

## 2017-04-10 LAB — MAGNESIUM: Magnesium: 1.9 mg/dL (ref 1.7–2.4)

## 2017-04-10 LAB — BASIC METABOLIC PANEL
ANION GAP: 8 (ref 5–15)
BUN: 6 mg/dL (ref 6–20)
CHLORIDE: 102 mmol/L (ref 101–111)
CO2: 24 mmol/L (ref 22–32)
Calcium: 7.9 mg/dL — ABNORMAL LOW (ref 8.9–10.3)
Creatinine, Ser: 0.69 mg/dL (ref 0.61–1.24)
GFR calc Af Amer: >60 mL/min (ref >60)
GLUCOSE: 95 mg/dL (ref 65–99)
POTASSIUM: 3.5 mmol/L (ref 3.5–5.1)
Sodium: 134 mmol/L — ABNORMAL LOW (ref 135–145)

## 2017-04-10 NOTE — Progress Notes (Signed)
[facility] Progress Note  7 Days Post-Op  Subjective: Pt is feeling good. No abdominal pain. He is having flatus and BM's. No nausea or vomiting and he is tolerating his diet. No fever or chills. No family at bedside this morning and pt is aware that he will go to [facility] at discharge.  Objective: Vital signs in last 24 hours: Temp:  [97.4 F (36.3 C)-98.1 F (36.7 C)] 97.4 F (36.3 C) (04/21 0422) Pulse Rate:  [75-77] 75 (04/21 0422) Resp:  [16-19] 16 (04/21 0422) BP: (123-135)/(70-78) 135/70 (04/21 0422) SpO2:  [99 %-100 %] 100 % (04/21 0422) Last BM Date: 04/09/17  Intake/Output from previous day: 04/20 0701 - 04/21 0700 In: 480 [P.O.:480] Out: 575 [Urine:575] Intake/Output this shift: Total I/O In: -  Out: 200 [Urine:200]  PE: Gen:  Alert, NAD, pleasant, cooperative, well appearing Card:  RRR, no M/G/R heard Pulm:  CTA, no W/R/R, effort normal Abd: Soft, not distended, +BS, midline incisions with staples and without surrounding erythema, edema or discharge, mild TTP around incision Skin: no rashes noted, warm and dry  Lab Results:   Recent Labs  04/08/17 2352 04/10/17 0558  WBC 12.5* 11.1*  HGB 12.0* 12.5*  HCT 34.7* 37.7*  PLT 236 244   BMET  Recent Labs  04/08/17 0408 04/10/17 0558  NA 135 134*  K 3.9 3.5  CL 106 102  CO2 24 24  GLUCOSE 118* 95  BUN 6 6  CREATININE 0.73 0.69  CALCIUM 7.6* 7.9*   PT/INR No results for input(s): LABPROT, INR in the last 72 hours. CMP     Component Value Date/Time   NA 134 (L) 04/10/2017 0558   NA 140 02/17/2017 1153   K 3.5 04/10/2017 0558   CL 102 04/10/2017 0558   CO2 24 04/10/2017 0558   GLUCOSE 95 04/10/2017 0558   BUN 6 04/10/2017 0558   BUN 12 02/17/2017 1153   CREATININE 0.69 04/10/2017 0558   CREATININE 0.87 08/01/2013 0922   CALCIUM 7.9 (L) 04/10/2017 0558   PROT 7.7 04/02/2017 2313   PROT 7.0 02/17/2017 1153   ALBUMIN 3.6 04/02/2017 2313   ALBUMIN 3.8 02/17/2017 1153    AST 30 04/02/2017 2313   ALT 18 04/02/2017 2313   ALKPHOS 67 04/02/2017 2313   BILITOT 1.0 04/02/2017 2313   BILITOT 0.7 02/17/2017 1153   GFRNONAA >60 04/10/2017 0558   GFRNONAA 77 08/01/2013 0922   GFRAA >60 04/10/2017 0558   GFRAA 89 08/01/2013 0922   Lipase     Component Value Date/Time   LIPASE 15 04/03/2017 0036       Studies/Results: No results found.  Anti-infectives: Anti-infectives    Start     Dose/Rate Route Frequency Ordered Stop   04/03/17 0800  cefoTEtan (CEFOTAN) 2 g in dextrose 5 % 50 mL IVPB     2 g 100 mL/hr over 30 Minutes Intravenous To Short Stay 04/03/17 0424 04/03/17 0902   04/03/17 0754  cefoTEtan in Dextrose 5% (CEFOTAN) 2-2.08 GM-% IVPB    Comments:  Henrine Screws   : cabinet override      04/03/17 0754 04/03/17 1959       Assessment/Plan gallstone ileus EXPLORATORY LAPAROTOMY AND RESECTION OF GALLSTONE ILEUS, 4/14, Dr. Grandville Silos. POD #6. Laparotomy, small bowel resection  FEN: reg diet, replete mg orally for 2 days, 1.9 today, AM labs ID: wbc trending down, ua neg, daily CBC DVT prophylaxis: Lovenox   Dispo: encourage ambulation, IS, pt is medically cleared to  be discharged to [facility].     LOS: 7 days    Kalman Drape , [facility] 04/10/2017, 8:54 AM Pager: [phone] Consults: [phone] Mon-Fri 7:00 am-4:30 pm Sat-Sun 7:00 am-11:30 am

## 2017-04-11 LAB — MAGNESIUM: Magnesium: 2 mg/dL (ref 1.7–2.4)

## 2017-04-11 MED ORDER — ONDANSETRON 4 MG PO TBDP: 4.0000 mg | ORAL_TABLET | Freq: Four times a day (QID) | ORAL | 0 refills | Status: DC | PRN

## 2017-04-11 MED ORDER — POLYETHYLENE GLYCOL 3350 17 G PO PACK: 17.0000 g | PACK | Freq: Every day | ORAL | 0 refills | Status: DC

## 2017-04-11 MED ORDER — BOOST / RESOURCE BREEZE PO LIQD: 1.0000 | Freq: Two times a day (BID) | ORAL | 0 refills | Status: DC

## 2017-04-11 MED ORDER — ACETAMINOPHEN 325 MG PO TABS: 650.0000 mg | ORAL_TABLET | Freq: Four times a day (QID) | ORAL | Status: DC | PRN

## 2017-04-11 MED ORDER — HYDROCODONE-ACETAMINOPHEN 5-325 MG PO TABS: 1.0000 | ORAL_TABLET | ORAL | 0 refills | Status: DC | PRN

## 2017-04-11 MED ORDER — DOCUSATE SODIUM 100 MG PO CAPS: 100.0000 mg | ORAL_CAPSULE | Freq: Two times a day (BID) | ORAL | 0 refills | Status: DC

## 2017-04-11 NOTE — Progress Notes (Signed)
Clinical Social worker spoke to patients daughter in regards to patient discharge plans. Patient is ready for discharge but facility stated via phone they do not take admissions during the weekend. Facility stated they had plans for patient to discharge to facility Monday and was unaware of weekend discharge. CSW spoke to admissions coordinator and she stated  That they are able to take patient on 04/12/17. CSW remain available for support and discharge needs.  Rhea Pink, MSW,  [place]

## 2017-04-11 NOTE — Discharge Instructions (Signed)
Alec Davis will need to be removed 12-14 days after date of surgery so on or around the 27th of April. If your follow up appointment with Dr. Grandville Silos falls around this date they can be removed then. If not then please schedule and appointment with one of our nurses at our office to have them removed.  [facility], [place] [phone]  OPEN ABDOMINAL SURGERY: POST OP INSTRUCTIONS  Always review your discharge instruction sheet given to you by the facility where your surgery was performed.  IF YOU HAVE DISABILITY OR FAMILY LEAVE FORMS, YOU MUST BRING THEM TO THE OFFICE FOR PROCESSING.  PLEASE DO NOT GIVE THEM TO YOUR DOCTOR.  1. A prescription for pain medication may be given to you upon discharge.  Take your pain medication as prescribed, if needed.  If narcotic pain medicine is not needed, then you may take acetaminophen (Tylenol) or ibuprofen (Advil) as needed. 2. Take your usually prescribed medications unless otherwise directed. 3. If you need a refill on your pain medication, please contact your pharmacy. They will contact our office to request authorization.  Prescriptions will not be filled after 5pm or on week-ends. 4. You should follow a light diet the first few days after arrival home, such as soup and crackers, pudding, etc.unless your doctor has advised otherwise. A high-fiber, low fat diet can be resumed as tolerated.   Be sure to include lots of fluids daily. Most patients will experience some swelling and bruising on the chest and neck area.  Ice packs will help.  Swelling and bruising can take several days to resolve 5. Most patients will experience some swelling and bruising in the area of the incision. Ice pack will help. Swelling and bruising can take several days to resolve..  6. It is common to experience some constipation if taking pain medication after surgery.  Increasing fluid intake and taking a stool softener will usually help or prevent this problem from  occurring.  A mild laxative (Milk of Magnesia or Miralax) should be taken according to package directions if there are no bowel movements after 48 hours. 7.  You may have steri-strips (small skin tapes) in place directly over the incision.  These strips should be left on the skin for 7-10 days.  If your surgeon used skin glue on the incision, you may shower in 24 hours.  The glue will flake off over the next 2-3 weeks.  Any sutures or staples will be removed at the office during your follow-up visit. You may find that a light gauze bandage over your incision may keep your staples from being rubbed or pulled. You may shower and replace the bandage daily. 8. ACTIVITIES:  You may resume regular (light) daily activities beginning the next day--such as daily self-care, walking, climbing stairs--gradually increasing activities as tolerated.  You may have sexual intercourse when it is comfortable.  Refrain from any heavy lifting or straining until approved by your doctor. a. You may drive when you no longer are taking prescription pain medication, you can comfortably wear a seatbelt, and you can safely maneuver your car and apply brakes 9. You should see your doctor in the office for a follow-up appointment approximately two weeks after your surgery.  Make sure that you call for this appointment within a day or two after you arrive home to insure a convenient appointment time. OTHER INSTRUCTIONS:  _____________________________________________________________ _____________________________________________________________  WHEN TO CALL YOUR DOCTOR: 1. Fever over 101.0 2. Inability to  urinate 3. Nausea and/or vomiting 4. Extreme swelling or bruising 5. Continued bleeding from incision. 6. Increased pain, redness, or drainage from the incision. 7. Difficulty swallowing or breathing 8. Muscle cramping or spasms. 9. Numbness or tingling in hands or feet or around lips.  The clinic staff is available to answer  your questions during regular business hours.  Please dont hesitate to call and ask to speak to one of the nurses if you have concerns.  For further questions, please visit [email or web address]

## 2017-04-11 NOTE — Discharge Summary (Deleted)
[facility] Discharge Summary   Patient ID: Alec Davis MRN: [id number] DOB/AGE: Oct 30, 1923 83 y.o.  Admit date: 04/02/2017 Discharge date: 04/11/2017  Admitting Diagnosis: Gallstone ileus with bowel obstruction Heart block, mobitz type 1 HTN AKI  Discharge Diagnosis Patient Active Problem List   Diagnosis Date Noted  . Gallstone ileus ([place]) 04/03/2017  . Heart block 12/18/2016  . Second degree AV block, Mobitz type I 12/17/2016  . Abdominal pain, epigastric 12/17/2016  . Mobitz type 1 second degree atrioventricular block   . Hyperlipemia 08/03/2013  . Depression 08/03/2013  . Vitamin D deficiency 03/07/2011  . GERD (gastroesophageal reflux disease) 03/07/2011  . Prostate cancer ([place]) 01/19/2011  . Essential hypertension 01/19/2011  . Right bundle branch block 01/19/2011  . INTERNAL HEMORRHOIDS 01/19/2011  . DIVERTICULOSIS, COLON 01/19/2011  . COLONIC POLYPS, HX OF 01/19/2011    Consultants None  Imaging: No results found.  Procedures Dr. [name] (04/03/17) - EXPLORATORY LAPAROTOMY AND RESECTION OF SMALL BOWEL AND GALLSTONE  Hospital Course:  Alec Davis is a 83year old male who presented to [facility] with abdominal pain, nausea and vomiting.  Workup showed gallstone ileus with small bowel obstruction.  Patient was admitted and underwent procedure listed above.  Tolerated procedure well and was transferred to the floor.  Diet was advanced as tolerated. Electrolytes were replenished as needed. On POD#8, the patient was voiding well, tolerating diet, ambulating well, pain well controlled, vital signs stable, incisions c/d/i and felt stable for discharge to SNF.  Patient will follow up in our office in 2 weeks and knows to call with questions or concerns.  He will call to confirm appointment date/time.    Patient was discharged in good condition.  The [place] Substance controlled database was reviewed prior to prescribing narcotic pain  medication to this patient.  Physical Exam: Gen: Alert, NAD, pleasant, cooperative, well appearing Card: RRR, no M/G/R heard Pulm: CTA, no W/R/R, effort normal Abd: Soft, not distended,+BS, midlineincisions with staples and without surrounding erythema, edema or discharge, no TTP  Skin: no rashes noted, warm and dry  Allergies as of 04/11/2017   No Known Allergies     Medication List    STOP taking these medications   ondansetron 4 MG tablet Commonly known as:  ZOFRAN     TAKE these medications   acetaminophen 325 MG tablet Commonly known as:  TYLENOL Take 2 tablets (650 mg total) by mouth every 6 (six) hours as needed for mild pain (or temp > 100).   aspirin 81 MG EC tablet Take 81 mg by mouth daily after supper.   CALCIUM 600 + D PO Take 1 tablet by mouth daily.   docusate sodium 100 MG capsule Commonly known as:  COLACE Take 1 capsule (100 mg total) by mouth 2 (two) times daily.   feeding supplement Liqd Take 1 Container by mouth 2 (two) times daily between meals.   HYDROcodone-acetaminophen 5-325 MG tablet Commonly known as:  NORCO/VICODIN Take 1 tablet by mouth every 4 (four) hours as needed for moderate pain.   lovastatin 20 MG tablet Commonly known as:  MEVACOR TAKE 1 TABLET DAILY What changed:  See the new instructions.   ondansetron 4 MG disintegrating tablet Commonly known as:  ZOFRAN-ODT Take 1 tablet (4 mg total) by mouth every 6 (six) hours as needed for nausea.   pantoprazole 20 MG tablet Commonly known as:  PROTONIX Take 1 tablet (20 mg total) by mouth daily.   polyethylene glycol packet Commonly known  as:  MIRALAX / GLYCOLAX Take 17 g by mouth daily.   Vitamin D (Cholecalciferol) 1000 units Tabs Take 1,000 Units by mouth daily.         Contact information for follow-up providers    THOMPSON,BURKE E, MD. Schedule an appointment as soon as possible for a visit in 2 week(s).   Specialty:  General Surgery Why:  For follow up on or  around May 1st Contact information: [address] [place] [address] [phone]            Contact information for after-discharge care    Destination    [facility] Follow up.   Specialty:  Skilled Nursing Facility Contact information: [address] [place] [phone]                  Signed: [facility] 04/11/2017, 9:25 AM Pager: [phone] Consults: [phone] Mon-Fri 7:00 am-4:30 pm Sat-Sun 7:00 am-11:30 am

## 2017-04-12 DIAGNOSIS — R262 Difficulty in walking, not elsewhere classified: Secondary | ICD-10-CM | POA: Diagnosis not present

## 2017-04-12 DIAGNOSIS — F329 Major depressive disorder, single episode, unspecified: Secondary | ICD-10-CM | POA: Diagnosis not present

## 2017-04-12 DIAGNOSIS — K648 Other hemorrhoids: Secondary | ICD-10-CM | POA: Diagnosis not present

## 2017-04-12 DIAGNOSIS — R278 Other lack of coordination: Secondary | ICD-10-CM | POA: Diagnosis not present

## 2017-04-12 DIAGNOSIS — E785 Hyperlipidemia, unspecified: Secondary | ICD-10-CM | POA: Diagnosis not present

## 2017-04-12 DIAGNOSIS — Z8546 Personal history of malignant neoplasm of prostate: Secondary | ICD-10-CM | POA: Diagnosis not present

## 2017-04-12 DIAGNOSIS — R1312 Dysphagia, oropharyngeal phase: Secondary | ICD-10-CM | POA: Diagnosis not present

## 2017-04-12 DIAGNOSIS — M6281 Muscle weakness (generalized): Secondary | ICD-10-CM | POA: Diagnosis not present

## 2017-04-12 DIAGNOSIS — I441 Atrioventricular block, second degree: Secondary | ICD-10-CM | POA: Diagnosis not present

## 2017-04-12 DIAGNOSIS — E559 Vitamin D deficiency, unspecified: Secondary | ICD-10-CM | POA: Diagnosis not present

## 2017-04-12 DIAGNOSIS — R110 Nausea: Secondary | ICD-10-CM | POA: Diagnosis not present

## 2017-04-12 DIAGNOSIS — K5780 Diverticulitis of intestine, part unspecified, with perforation and abscess without bleeding: Secondary | ICD-10-CM | POA: Diagnosis not present

## 2017-04-12 DIAGNOSIS — N400 Enlarged prostate without lower urinary tract symptoms: Secondary | ICD-10-CM | POA: Diagnosis not present

## 2017-04-12 DIAGNOSIS — K219 Gastro-esophageal reflux disease without esophagitis: Secondary | ICD-10-CM | POA: Diagnosis not present

## 2017-04-12 DIAGNOSIS — I10 Essential (primary) hypertension: Secondary | ICD-10-CM | POA: Diagnosis not present

## 2017-04-12 DIAGNOSIS — R52 Pain, unspecified: Secondary | ICD-10-CM | POA: Diagnosis not present

## 2017-04-12 DIAGNOSIS — Z7982 Long term (current) use of aspirin: Secondary | ICD-10-CM | POA: Diagnosis not present

## 2017-04-12 DIAGNOSIS — R41841 Cognitive communication deficit: Secondary | ICD-10-CM | POA: Diagnosis not present

## 2017-04-12 DIAGNOSIS — K5900 Constipation, unspecified: Secondary | ICD-10-CM | POA: Diagnosis not present

## 2017-04-12 DIAGNOSIS — Z48815 Encounter for surgical aftercare following surgery on the digestive system: Secondary | ICD-10-CM | POA: Diagnosis not present

## 2017-04-12 NOTE — Discharge Summary (Signed)
Patient ID: Alec Davis MRN: [id number] DOB/AGE: [birth year] 81 y.o.  Admit date: 04/02/2017 Discharge date: 04/12/2017  Admitting Diagnosis: Gallstone ileus with bowel obstruction Heart block, mobitz type 1 HTN AKI  Discharge Diagnosis     Patient Active Problem List   Diagnosis Date Noted  . Gallstone ileus ([place]) 04/03/2017  . Heart block 12/18/2016  . Second degree AV block, Mobitz type I 12/17/2016  . Abdominal pain, epigastric 12/17/2016  . Mobitz type 1 second degree atrioventricular block   . Hyperlipemia 08/03/2013  . Depression 08/03/2013  . Vitamin D deficiency 03/07/2011  . GERD (gastroesophageal reflux disease) 03/07/2011  . Prostate cancer ([place]) 01/19/2011  . Essential hypertension 01/19/2011  . Right bundle branch block 01/19/2011  . INTERNAL HEMORRHOIDS 01/19/2011  . DIVERTICULOSIS, COLON 01/19/2011  . COLONIC POLYPS, HX OF 01/19/2011    Consultants None  Imaging: ImagingResults(Last48hours)  No results found.    Procedures Dr. [name] (04/03/17) - EXPLORATORY LAPAROTOMY AND RESECTION OF SMALL BOWEL AND GALLSTONE  Hospital Course:  Alec Davis is a 83year old male who presented to [facility] with abdominal pain, nausea and vomiting.  Workup showed gallstone ileus with small bowel obstruction.  Patient was admitted and underwent procedure listed above.  Tolerated procedure well and was transferred to the floor.  Diet was advanced as tolerated. Electrolytes were replenished as needed. On POD#8, the patient was voiding well, tolerating diet, ambulating well, pain well controlled, vital signs stable, incisions c/d/i and felt stable for discharge to SNF.  Patient will follow up in our office in 2 weeks and knows to call with questions or concerns.  He will call to confirm appointment date/time.    Patient was discharged in good condition.  The [place] Substance controlled database was reviewed prior to prescribing narcotic pain  medication to this patient.  Physical Exam: Gen: Alert, NAD, pleasant, cooperative, well appearing Card: RRR, no M/G/R heard Pulm: CTA, no W/R/R, effort normal Abd: Soft, not distended,+BS, midlineincisions with staples and without surrounding erythema, edema or discharge, no TTP  Skin: no rashes noted, warm and dry  Allergies as of 04/11/2017   No Known Allergies        Medication List    STOP taking these medications   ondansetron 4 MG tablet Commonly known as:  ZOFRAN     TAKE these medications   acetaminophen 325 MG tablet Commonly known as:  TYLENOL Take 2 tablets (650 mg total) by mouth every 6 (six) hours as needed for mild pain (or temp > 100).   aspirin 81 MG EC tablet Take 81 mg by mouth daily after supper.   CALCIUM 600 + D PO Take 1 tablet by mouth daily.   docusate sodium 100 MG capsule Commonly known as:  COLACE Take 1 capsule (100 mg total) by mouth 2 (two) times daily.   feeding supplement Liqd Take 1 Container by mouth 2 (two) times daily between meals.     lovastatin 20 MG tablet Commonly known as:  MEVACOR TAKE 1 TABLET DAILY What changed:  See the new instructions.   ondansetron 4 MG disintegrating tablet Commonly known as:  ZOFRAN-ODT Take 1 tablet (4 mg total) by mouth every 6 (six) hours as needed for nausea.   pantoprazole 20 MG tablet Commonly known as:  PROTONIX Take 1 tablet (20 mg total) by mouth daily.   polyethylene glycol packet Commonly known as:  MIRALAX / GLYCOLAX Take 17 g by mouth daily.   Vitamin D (Cholecalciferol) 1000 units Tabs  Take 1,000 Units by mouth daily.             Contact information for follow-up providers        THOMPSON,BURKE E, MD. Schedule an appointment as soon as possible for a visit in 2 week(s).   Specialty:  General Surgery Why:  For follow up on or around May 1st Contact information: [address]  [place] [phone]                  Contact information for after-discharge care            Destination        [facility] Follow up.   Specialty:  Skilled Nursing Facility Contact information: [address] [place] [phone]                    Signed: [facility] 04/11/2017, 9:25 AM Pager: [phone] Consults: [phone] Mon-Fri 7:00 am-4:30 pm Sat-Sun 7:00 am-11:30 am

## 2017-04-12 NOTE — Care Management Note (Signed)
Case Management Note  Patient Details  Name: Alec Davis MRN: [id number] Date of Birth: 1923/01/01  Subjective/Objective:                    Action/Plan:  SW following for SNF  Expected Discharge Date:  04/12/17               Expected Discharge Plan:  Skilled Nursing Facility  In-House Referral:  Clinical Social Work  Discharge planning Services     Post Acute Care Choice:    Choice offered to:     DME Arranged:    DME Agency:     HH Arranged:    [place] Agency:     Status of Service:  Completed, signed off  If discussed at [employer], dates discussed:    Additional Comments:  Marilu Favre, RN 04/12/2017, 11:35 AM

## 2017-04-12 NOTE — Progress Notes (Signed)
All discharge instructions reviewed with pt & daughter with understanding verbalized.  Nursing report called to [facility] & report given to Galen Daft, RN; all questions addressed.  VSS.  Pt denies any questions prior to discharge.  Pt discharged to [facility] with daughter in stable condition @ approx 1445.

## 2017-04-12 NOTE — Progress Notes (Signed)
PT Cancellation Note  Patient Details Name: Alec Davis MRN: [id number] DOB: 1923/08/14   Cancelled Treatment:    Reason Eval/Treat Not Completed: Other (comment) (Pt refused PT.  Supposed to d/c this pm.  Will return as able.)Thanks.   Denice Paradise 04/12/2017, 12:47 PM Lauri Purdum,PT Acute Rehabilitation [phone] (pager)

## 2017-04-12 NOTE — Clinical Social Work Note (Addendum)
Clinical Social Worker facilitated patient discharge including contacting patient family and facility to confirm patient discharge plans.  Clinical information faxed to facility and family agreeable with plan.  Per RN pt's daughter will transport pt to [facility] .  RN to call [phone] for report prior to discharge.  Clinical Social Worker will sign off for now as social work intervention is no longer needed. Please consult [place] again if new need arises.  [place], [place]

## 2017-04-13 ENCOUNTER — Ambulatory Visit: Payer: Medicare Other | Admitting: Nurse Practitioner

## 2017-05-06 DIAGNOSIS — K219 Gastro-esophageal reflux disease without esophagitis: Secondary | ICD-10-CM | POA: Diagnosis not present

## 2017-05-06 DIAGNOSIS — Z48815 Encounter for surgical aftercare following surgery on the digestive system: Secondary | ICD-10-CM | POA: Diagnosis not present

## 2017-05-06 DIAGNOSIS — Z9181 History of falling: Secondary | ICD-10-CM | POA: Diagnosis not present

## 2017-05-06 DIAGNOSIS — N400 Enlarged prostate without lower urinary tract symptoms: Secondary | ICD-10-CM | POA: Diagnosis not present

## 2017-05-06 DIAGNOSIS — I10 Essential (primary) hypertension: Secondary | ICD-10-CM | POA: Diagnosis not present

## 2017-05-06 DIAGNOSIS — I441 Atrioventricular block, second degree: Secondary | ICD-10-CM | POA: Diagnosis not present

## 2017-05-07 DIAGNOSIS — K219 Gastro-esophageal reflux disease without esophagitis: Secondary | ICD-10-CM | POA: Diagnosis not present

## 2017-05-07 DIAGNOSIS — Z9181 History of falling: Secondary | ICD-10-CM | POA: Diagnosis not present

## 2017-05-07 DIAGNOSIS — Z48815 Encounter for surgical aftercare following surgery on the digestive system: Secondary | ICD-10-CM | POA: Diagnosis not present

## 2017-05-07 DIAGNOSIS — I441 Atrioventricular block, second degree: Secondary | ICD-10-CM | POA: Diagnosis not present

## 2017-05-07 DIAGNOSIS — N400 Enlarged prostate without lower urinary tract symptoms: Secondary | ICD-10-CM | POA: Diagnosis not present

## 2017-05-07 DIAGNOSIS — I10 Essential (primary) hypertension: Secondary | ICD-10-CM | POA: Diagnosis not present

## 2017-05-11 DIAGNOSIS — H903 Sensorineural hearing loss, bilateral: Secondary | ICD-10-CM | POA: Diagnosis not present

## 2017-05-12 DIAGNOSIS — K219 Gastro-esophageal reflux disease without esophagitis: Secondary | ICD-10-CM | POA: Diagnosis not present

## 2017-05-12 DIAGNOSIS — Z48815 Encounter for surgical aftercare following surgery on the digestive system: Secondary | ICD-10-CM | POA: Diagnosis not present

## 2017-05-12 DIAGNOSIS — I441 Atrioventricular block, second degree: Secondary | ICD-10-CM | POA: Diagnosis not present

## 2017-05-12 DIAGNOSIS — I10 Essential (primary) hypertension: Secondary | ICD-10-CM | POA: Diagnosis not present

## 2017-05-12 DIAGNOSIS — N400 Enlarged prostate without lower urinary tract symptoms: Secondary | ICD-10-CM | POA: Diagnosis not present

## 2017-05-12 DIAGNOSIS — Z9181 History of falling: Secondary | ICD-10-CM | POA: Diagnosis not present

## 2017-05-14 DIAGNOSIS — K219 Gastro-esophageal reflux disease without esophagitis: Secondary | ICD-10-CM | POA: Diagnosis not present

## 2017-05-14 DIAGNOSIS — Z9181 History of falling: Secondary | ICD-10-CM | POA: Diagnosis not present

## 2017-05-14 DIAGNOSIS — Z48815 Encounter for surgical aftercare following surgery on the digestive system: Secondary | ICD-10-CM | POA: Diagnosis not present

## 2017-05-14 DIAGNOSIS — N400 Enlarged prostate without lower urinary tract symptoms: Secondary | ICD-10-CM | POA: Diagnosis not present

## 2017-05-14 DIAGNOSIS — I10 Essential (primary) hypertension: Secondary | ICD-10-CM | POA: Diagnosis not present

## 2017-05-14 DIAGNOSIS — I441 Atrioventricular block, second degree: Secondary | ICD-10-CM | POA: Diagnosis not present

## 2017-05-18 DIAGNOSIS — N400 Enlarged prostate without lower urinary tract symptoms: Secondary | ICD-10-CM | POA: Diagnosis not present

## 2017-05-18 DIAGNOSIS — I10 Essential (primary) hypertension: Secondary | ICD-10-CM | POA: Diagnosis not present

## 2017-05-18 DIAGNOSIS — I441 Atrioventricular block, second degree: Secondary | ICD-10-CM | POA: Diagnosis not present

## 2017-05-18 DIAGNOSIS — K219 Gastro-esophageal reflux disease without esophagitis: Secondary | ICD-10-CM | POA: Diagnosis not present

## 2017-05-18 DIAGNOSIS — Z48815 Encounter for surgical aftercare following surgery on the digestive system: Secondary | ICD-10-CM | POA: Diagnosis not present

## 2017-05-18 DIAGNOSIS — Z9181 History of falling: Secondary | ICD-10-CM | POA: Diagnosis not present

## 2017-05-21 DIAGNOSIS — K219 Gastro-esophageal reflux disease without esophagitis: Secondary | ICD-10-CM | POA: Diagnosis not present

## 2017-05-21 DIAGNOSIS — Z9181 History of falling: Secondary | ICD-10-CM | POA: Diagnosis not present

## 2017-05-21 DIAGNOSIS — I441 Atrioventricular block, second degree: Secondary | ICD-10-CM | POA: Diagnosis not present

## 2017-05-21 DIAGNOSIS — Z48815 Encounter for surgical aftercare following surgery on the digestive system: Secondary | ICD-10-CM | POA: Diagnosis not present

## 2017-05-21 DIAGNOSIS — N400 Enlarged prostate without lower urinary tract symptoms: Secondary | ICD-10-CM | POA: Diagnosis not present

## 2017-05-21 DIAGNOSIS — I10 Essential (primary) hypertension: Secondary | ICD-10-CM | POA: Diagnosis not present

## 2017-05-24 NOTE — Addendum Note (Signed)
Addendum  created 05/24/17 1358 by Oleta Mouse, MD   Sign clinical note

## 2017-05-26 DIAGNOSIS — I441 Atrioventricular block, second degree: Secondary | ICD-10-CM | POA: Diagnosis not present

## 2017-05-26 DIAGNOSIS — N400 Enlarged prostate without lower urinary tract symptoms: Secondary | ICD-10-CM | POA: Diagnosis not present

## 2017-05-26 DIAGNOSIS — K219 Gastro-esophageal reflux disease without esophagitis: Secondary | ICD-10-CM | POA: Diagnosis not present

## 2017-05-26 DIAGNOSIS — I10 Essential (primary) hypertension: Secondary | ICD-10-CM | POA: Diagnosis not present

## 2017-05-26 DIAGNOSIS — Z9181 History of falling: Secondary | ICD-10-CM | POA: Diagnosis not present

## 2017-05-26 DIAGNOSIS — Z48815 Encounter for surgical aftercare following surgery on the digestive system: Secondary | ICD-10-CM | POA: Diagnosis not present

## 2017-05-28 DIAGNOSIS — Z48815 Encounter for surgical aftercare following surgery on the digestive system: Secondary | ICD-10-CM | POA: Diagnosis not present

## 2017-05-28 DIAGNOSIS — I10 Essential (primary) hypertension: Secondary | ICD-10-CM | POA: Diagnosis not present

## 2017-05-28 DIAGNOSIS — N400 Enlarged prostate without lower urinary tract symptoms: Secondary | ICD-10-CM | POA: Diagnosis not present

## 2017-05-28 DIAGNOSIS — Z9181 History of falling: Secondary | ICD-10-CM | POA: Diagnosis not present

## 2017-05-28 DIAGNOSIS — K219 Gastro-esophageal reflux disease without esophagitis: Secondary | ICD-10-CM | POA: Diagnosis not present

## 2017-05-28 DIAGNOSIS — I441 Atrioventricular block, second degree: Secondary | ICD-10-CM | POA: Diagnosis not present

## 2017-06-02 DIAGNOSIS — Z48815 Encounter for surgical aftercare following surgery on the digestive system: Secondary | ICD-10-CM | POA: Diagnosis not present

## 2017-06-02 DIAGNOSIS — I10 Essential (primary) hypertension: Secondary | ICD-10-CM | POA: Diagnosis not present

## 2017-06-02 DIAGNOSIS — N400 Enlarged prostate without lower urinary tract symptoms: Secondary | ICD-10-CM | POA: Diagnosis not present

## 2017-06-02 DIAGNOSIS — I441 Atrioventricular block, second degree: Secondary | ICD-10-CM | POA: Diagnosis not present

## 2017-06-02 DIAGNOSIS — Z9181 History of falling: Secondary | ICD-10-CM | POA: Diagnosis not present

## 2017-06-02 DIAGNOSIS — K219 Gastro-esophageal reflux disease without esophagitis: Secondary | ICD-10-CM | POA: Diagnosis not present

## 2017-06-19 ENCOUNTER — Ambulatory Visit (INDEPENDENT_AMBULATORY_CARE_PROVIDER_SITE_OTHER): Payer: Medicare Other | Admitting: Family Medicine

## 2017-06-19 DIAGNOSIS — Z9181 History of falling: Secondary | ICD-10-CM

## 2017-06-19 DIAGNOSIS — I10 Essential (primary) hypertension: Secondary | ICD-10-CM

## 2017-06-19 DIAGNOSIS — I441 Atrioventricular block, second degree: Secondary | ICD-10-CM | POA: Diagnosis not present

## 2017-06-19 DIAGNOSIS — K219 Gastro-esophageal reflux disease without esophagitis: Secondary | ICD-10-CM

## 2017-06-19 DIAGNOSIS — Z48815 Encounter for surgical aftercare following surgery on the digestive system: Secondary | ICD-10-CM | POA: Diagnosis not present

## 2017-06-19 DIAGNOSIS — N400 Enlarged prostate without lower urinary tract symptoms: Secondary | ICD-10-CM

## 2017-06-28 DIAGNOSIS — Z08 Encounter for follow-up examination after completed treatment for malignant neoplasm: Secondary | ICD-10-CM | POA: Diagnosis not present

## 2017-06-28 DIAGNOSIS — D225 Melanocytic nevi of trunk: Secondary | ICD-10-CM | POA: Diagnosis not present

## 2017-06-28 DIAGNOSIS — Z85828 Personal history of other malignant neoplasm of skin: Secondary | ICD-10-CM | POA: Diagnosis not present

## 2017-06-28 DIAGNOSIS — L570 Actinic keratosis: Secondary | ICD-10-CM | POA: Diagnosis not present

## 2017-06-28 DIAGNOSIS — X32XXXD Exposure to sunlight, subsequent encounter: Secondary | ICD-10-CM | POA: Diagnosis not present

## 2017-06-30 ENCOUNTER — Other Ambulatory Visit: Payer: Self-pay | Admitting: Family Medicine

## 2017-07-01 NOTE — Telephone Encounter (Signed)
Last seen DWM 03/10/17  Last lipid 10/01/16

## 2017-07-16 DIAGNOSIS — H26491 Other secondary cataract, right eye: Secondary | ICD-10-CM | POA: Diagnosis not present

## 2017-07-19 ENCOUNTER — Encounter: Payer: Self-pay | Admitting: Family Medicine

## 2017-07-19 ENCOUNTER — Ambulatory Visit (INDEPENDENT_AMBULATORY_CARE_PROVIDER_SITE_OTHER): Payer: Medicare Other

## 2017-07-19 ENCOUNTER — Ambulatory Visit (INDEPENDENT_AMBULATORY_CARE_PROVIDER_SITE_OTHER): Payer: Medicare Other | Admitting: Family Medicine

## 2017-07-19 VITALS — BP 113/68 | HR 71 | Temp 96.6°F | Ht 74.0 in | Wt 196.0 lb

## 2017-07-19 DIAGNOSIS — R200 Anesthesia of skin: Secondary | ICD-10-CM

## 2017-07-19 DIAGNOSIS — E559 Vitamin D deficiency, unspecified: Secondary | ICD-10-CM | POA: Diagnosis not present

## 2017-07-19 DIAGNOSIS — E784 Other hyperlipidemia: Secondary | ICD-10-CM

## 2017-07-19 DIAGNOSIS — M5440 Lumbago with sciatica, unspecified side: Secondary | ICD-10-CM | POA: Diagnosis not present

## 2017-07-19 DIAGNOSIS — K219 Gastro-esophageal reflux disease without esophagitis: Secondary | ICD-10-CM

## 2017-07-19 DIAGNOSIS — I10 Essential (primary) hypertension: Secondary | ICD-10-CM | POA: Diagnosis not present

## 2017-07-19 DIAGNOSIS — R202 Paresthesia of skin: Secondary | ICD-10-CM

## 2017-07-19 DIAGNOSIS — C61 Malignant neoplasm of prostate: Secondary | ICD-10-CM

## 2017-07-19 DIAGNOSIS — E7849 Other hyperlipidemia: Secondary | ICD-10-CM

## 2017-07-19 NOTE — Patient Instructions (Addendum)
Medicare Annual Wellness Visit  [facility] and the medical providers at [facility] strive to bring you the best medical care.  In doing so we not only want to address your current medical conditions and concerns but also to detect new conditions early and prevent illness, disease and health-related problems.    Medicare offers a yearly Wellness Visit which allows our clinical staff to assess your need for preventative services including immunizations, lifestyle education, counseling to decrease risk of preventable diseases and screening for fall risk and other medical concerns.    This visit is provided free of charge (no copay) for all Medicare recipients. The clinical pharmacists at [facility] have begun to conduct these Wellness Visits which will also include a thorough review of all your medications.    As you primary medical provider recommend that you make an appointment for your Annual Wellness Visit if you have not done so already this year.  You may set up this appointment before you leave today or you may call back ([phone]) and schedule an appointment.  Please make sure when you call that you mention that you are scheduling your Annual Wellness Visit with the clinical pharmacist so that the appointment may be made for the proper length of time.     Continue current medications. Continue good therapeutic lifestyle changes which include good diet and exercise. Fall precautions discussed with patient. If an FOBT was given today- please return it to our front desk. If you are over 80 years old - you may need Prevnar 51 or the adult Pneumonia vaccine.  **Flu shots are available--- please call and schedule a FLU-CLINIC appointment**  After your visit with [place] today you will receive a survey in the mail or online from [employer] regarding your care with [place]. Please take a moment to fill this out. Your feedback is very  important to [place] as you can help [place] better understand your patient needs as well as improve your experience and satisfaction. WE CARE ABOUT YOU!!!   Follow-up with urology as planned We will call with lab work results and send a copy to the facility that you're staying at. Avoid NSAIDs and only take Tylenol for pain Wear support hose that these on the first thing with arising in the morning Try to walk regularly and avoid sodium intake

## 2017-07-19 NOTE — Progress Notes (Signed)
 Subjective:    Patient ID: Alec Davis, male    DOB: 11/12/1923, 83 y.o.   MRN: [id number]  HPI  Pt here for follow up and management of chronic medical problems which includes hyperlipidemia and hypertension. He is taking medication regularly.The patient is doing well but lower quadrant than usual. He complains of some superficial numbness on the right leg. He also has some swelling in the right foot. He has had a difficult winter. He is had pneumonia followed by a lot of weakness and subsequently gallbladder ileus requiring surgery. He still continues to follow-up with the urologist because of his prostate cancer. He will be given an FOBT to return and we will also get lab work on him today including a B12 and thyroid panel. He continues to follow-up with Dr. Davis and he understands the importance of avoiding NSAIDs and only taking Tylenol for aches pains and fever.Both the patient and his [facility] which [facility] a [facility] facility and so still and they're happy with this move. They still wear a Lifeline in case there is an emergency.     Patient Active Problem List   Diagnosis Date Noted  . Gallstone ileus ([facility]) 04/03/2017  . Heart block 12/18/2016  . Second degree AV block, Mobitz type I 12/17/2016  . Abdominal pain, epigastric 12/17/2016  . Mobitz type 1 second degree atrioventricular block   . Hyperlipemia 08/03/2013  . Depression 08/03/2013  . Vitamin D deficiency 03/07/2011  . GERD (gastroesophageal reflux disease) 03/07/2011  . Prostate cancer ([facility]) 01/19/2011  . Essential hypertension 01/19/2011  . Right bundle branch block 01/19/2011  . INTERNAL HEMORRHOIDS 01/19/2011  . DIVERTICULOSIS, COLON 01/19/2011  . COLONIC POLYPS, HX OF 01/19/2011   Outpatient Encounter Prescriptions as of 07/19/2017  Medication Sig  . acetaminophen (TYLENOL) 325 MG tablet Take 2 tablets (650 mg total) by mouth every 6 (six) hours as needed for mild pain (or temp >  100).  . aspirin 81 MG EC tablet Take 81 mg by mouth daily after supper.    . Calcium Carb-Cholecalciferol (CALCIUM 600 + D PO) Take 1 tablet by mouth daily.  . cholecalciferol (VITAMIN D) 1000 units tablet Take 1,000 Units by mouth daily.  . lovastatin (MEVACOR) 20 MG tablet Take 1 tablet by mouth daily.  . Multiple Vitamin (MULTIVITAMIN) capsule Take 1 capsule by mouth daily.  . [DISCONTINUED] lovastatin (MEVACOR) 20 MG tablet TAKE 1 TABLET DAILY  . [DISCONTINUED] docusate sodium (COLACE) 100 MG capsule Take 1 capsule (100 mg total) by mouth 2 (two) times daily.  . [DISCONTINUED] feeding supplement (BOOST / RESOURCE BREEZE) LIQD Take 1 Container by mouth 2 (two) times daily between meals.  . [DISCONTINUED] HYDROcodone-acetaminophen (NORCO/VICODIN) 5-325 MG tablet Take 1 tablet by mouth every 4 (four) hours as needed for moderate pain.  . [DISCONTINUED] ondansetron (ZOFRAN-ODT) 4 MG disintegrating tablet Take 1 tablet (4 mg total) by mouth every 6 (six) hours as needed for nausea.  . [DISCONTINUED] pantoprazole (PROTONIX) 20 MG tablet Take 1 tablet (20 mg total) by mouth daily.  . [DISCONTINUED] polyethylene glycol (MIRALAX / GLYCOLAX) packet Take 17 g by mouth daily.  . [DISCONTINUED] Vitamin D, Cholecalciferol, 1000 UNITS TABS Take 1,000 Units by mouth daily.    No facility-administered encounter medications on file as of 07/19/2017.      Review of Systems  Constitutional: Negative.   HENT: Negative.   Eyes: Negative.   Respiratory: Negative.   Cardiovascular: Negative.   Gastrointestinal: Negative.     Endocrine: Negative.   Genitourinary: Negative.   Musculoskeletal: Negative.   Skin: Negative.        Skin numbness - right leg   Allergic/Immunologic: Negative.   Neurological: Negative.   Hematological: Negative.   Psychiatric/Behavioral: Negative.        Objective:   Physical Exam  Constitutional: He is oriented to person, place, and time. He appears well-developed and  well-nourished. No distress.  Patient is pleasant and alert and maybe looks a little more fatigued and tired than when I saw him the last time. He has been through a lot this winter and he is moved to a retirement facility and I'm sure that this is taken a toll on his demeanor at his age. He is still pleasant and relaxed  HENT:  Head: Normocephalic and atraumatic.  Right Ear: External ear normal.  Left Ear: External ear normal.  Nose: Nose normal.  Mouth/Throat: Oropharynx is clear and moist. No oropharyngeal exudate.  Eyes: Pupils are equal, round, and reactive to light. Conjunctivae and EOM are normal. Right eye exhibits no discharge. Left eye exhibits no discharge. No scleral icterus.  Neck: Normal range of motion. Neck supple. No thyromegaly present.  No bruits or thyromegaly or anterior cervical adenopathy  Cardiovascular: Normal rate, regular rhythm and normal heart sounds.   No murmur heard. Pedal pulses were a little bit more difficult to palpate this time. The heart is regular at 72/m  Pulmonary/Chest: Effort normal and breath sounds normal. No respiratory distress. He has no wheezes. He has no rales. He exhibits no tenderness.  Clear anteriorly and posteriorly  Abdominal: Soft. Bowel sounds are normal. He exhibits no mass. There is no tenderness. There is no rebound and no guarding.  No abdominal tenderness masses or bruits. Midline incision scar noted.  Genitourinary:  Genitourinary Comments: Follow-up with Dr. Davis, the urologist as planned  Musculoskeletal: Normal range of motion. He exhibits edema. He exhibits no tenderness.  Right pedal edema.  Lymphadenopathy:    He has no cervical adenopathy.  Neurological: He is alert and oriented to person, place, and time.  Skin: Skin is warm and dry. No rash noted.  Psychiatric: He has a normal mood and affect. His behavior is normal. Judgment and thought content normal.  Mood is a little flatter than usual. He is still pleasant and  smiling.  Nursing note and vitals reviewed.  BP 113/68 (BP Location: Left Arm)   Pulse 71   Temp (!) 96.6 F (35.9 C) (Oral)   Ht 6' 2" (1.88 m)   Wt 196 lb (88.9 kg)   BMI 25.16 kg/m    LS spine films with results pending===          Assessment & Plan:  1. Vitamin D deficiency -Continue current treatment pending results of lab work - CBC with Differential/Platelet - VITAMIN D 25 Hydroxy (Vit-D Deficiency, Fractures)  2. Other hyperlipidemia -Continue with therapeutic lifestyle changes - CBC with Differential/Platelet - Lipid panel  3. Gastroesophageal reflux disease, esophagitis presence not specified -No complaints with this today. - CBC with Differential/Platelet  4. PROSTATE CANCER -Follow-up with Dr. Davis as planned - CBC with Differential/Platelet  5. Essential hypertension -The blood pressure is good today and the patient will continue to watch his sodium intake - CBC with Differential/Platelet - BMP8+EGFR - Hepatic function panel  6. Numbness and tingling sensation of skin - Vitamin B12 - Thyroid Panel With TSH - DG Lumbar Spine 2-3 Views; Future  7. Acute right-sided low back   pain with sciatica, sciatica laterality unspecified -LS-spine films because of neuropathy to right leg and swelling in right foot.  Patient Instructions                       Medicare Annual Wellness Visit  [facility] and the medical providers at [facility] strive to bring you the best medical care.  In doing so we not only want to address your current medical conditions and concerns but also to detect new conditions early and prevent illness, disease and health-related problems.    Medicare offers a yearly Wellness Visit which allows our clinical staff to assess your need for preventative services including immunizations, lifestyle education, counseling to decrease risk of preventable diseases and screening for fall risk and other medical concerns.     This visit is provided free of charge (no copay) for all Medicare recipients. The clinical pharmacists at [facility] have begun to conduct these Wellness Visits which will also include a thorough review of all your medications.    As you primary medical provider recommend that you make an appointment for your Annual Wellness Visit if you have not done so already this year.  You may set up this appointment before you leave today or you may call back ([phone]) and schedule an appointment.  Please make sure when you call that you mention that you are scheduling your Annual Wellness Visit with the clinical pharmacist so that the appointment may be made for the proper length of time.     Continue current medications. Continue good therapeutic lifestyle changes which include good diet and exercise. Fall precautions discussed with patient. If an FOBT was given today- please return it to our front desk. If you are over 80 years old - you may need Prevnar 61 or the adult Pneumonia vaccine.  **Flu shots are available--- please call and schedule a FLU-CLINIC appointment**  After your visit with [place] today you will receive a survey in the mail or online from [employer] regarding your care with [place]. Please take a moment to fill this out. Your feedback is very important to [place] as you can help [place] better understand your patient needs as well as improve your experience and satisfaction. WE CARE ABOUT YOU!!!   Follow-up with urology as planned We will call with lab work results and send a copy to the facility that you're staying at. Avoid NSAIDs and only take Tylenol for pain Wear support hose that these on the first thing with arising in the morning Try to walk regularly and avoid sodium intake   Arrie Senate MD

## 2017-07-20 LAB — HEPATIC FUNCTION PANEL
ALBUMIN: 3.7 g/dL (ref 3.2–4.6)
ALK PHOS: 111 IU/L (ref 39–117)
ALT: 11 IU/L (ref 0–44)
AST: 20 IU/L (ref 0–40)
BILIRUBIN TOTAL: 0.3 mg/dL (ref 0.0–1.2)
Bilirubin, Direct: 0.1 mg/dL (ref 0.00–0.40)
Total Protein: 6.4 g/dL (ref 6.0–8.5)

## 2017-07-20 LAB — CBC WITH DIFFERENTIAL/PLATELET
BASOS: 1 %
Basophils Absolute: 0.1 10*3/uL (ref 0.0–0.2)
EOS (ABSOLUTE): 0.4 10*3/uL (ref 0.0–0.4)
EOS: 5 %
HEMATOCRIT: 37.3 % — AB (ref 37.5–51.0)
Hemoglobin: 12.8 g/dL — ABNORMAL LOW (ref 13.0–17.7)
Immature Grans (Abs): 0 10*3/uL (ref 0.0–0.1)
Immature Granulocytes: 1 %
LYMPHS: 23 %
Lymphocytes Absolute: 1.7 10*3/uL (ref 0.7–3.1)
MCH: 30.9 pg (ref 26.6–33.0)
MCHC: 34.3 g/dL (ref 31.5–35.7)
MCV: 90 fL (ref 79–97)
MONOS ABS: 0.8 10*3/uL (ref 0.1–0.9)
Monocytes: 10 %
NEUTROS PCT: 60 %
Neutrophils Absolute: 4.7 10*3/uL (ref 1.4–7.0)
Platelets: 295 10*3/uL (ref 150–379)
RBC: 4.14 x10E6/uL (ref 4.14–5.80)
RDW: 14.2 % (ref 12.3–15.4)
WBC: 7.6 10*3/uL (ref 3.4–10.8)

## 2017-07-20 LAB — BMP8+EGFR
BUN/Creatinine Ratio: 13 (ref 10–24)
BUN: 11 mg/dL (ref 10–36)
CALCIUM: 8.8 mg/dL (ref 8.6–10.2)
CHLORIDE: 101 mmol/L (ref 96–106)
CO2: 24 mmol/L (ref 20–29)
Creatinine, Ser: 0.88 mg/dL (ref 0.76–1.27)
GFR calc Af Amer: 86 mL/min/{1.73_m2} (ref >59)
GFR, EST NON AFRICAN AMERICAN: 75 mL/min/{1.73_m2} (ref >59)
Glucose: 87 mg/dL (ref 65–99)
POTASSIUM: 3.8 mmol/L (ref 3.5–5.2)
Sodium: 142 mmol/L (ref 134–144)

## 2017-07-20 LAB — THYROID PANEL WITH TSH
Free Thyroxine Index: 1.9 (ref 1.2–4.9)
T3 Uptake Ratio: 33 % (ref 24–39)
T4, Total: 5.9 ug/dL (ref 4.5–12.0)
TSH: 1.36 u[IU]/mL (ref 0.450–4.500)

## 2017-07-20 LAB — LIPID PANEL
CHOL/HDL RATIO: 3 ratio (ref 0.0–5.0)
Cholesterol, Total: 133 mg/dL (ref 100–199)
HDL: 45 mg/dL (ref >39)
LDL Calculated: 64 mg/dL (ref 0–99)
TRIGLYCERIDES: 120 mg/dL (ref 0–149)
VLDL CHOLESTEROL CAL: 24 mg/dL (ref 5–40)

## 2017-07-20 LAB — VITAMIN B12: Vitamin B-12: 462 pg/mL (ref 232–1245)

## 2017-07-20 LAB — VITAMIN D 25 HYDROXY (VIT D DEFICIENCY, FRACTURES): Vit D, 25-Hydroxy: 52.8 ng/mL (ref 30.0–100.0)

## 2017-07-26 DIAGNOSIS — N320 Bladder-neck obstruction: Secondary | ICD-10-CM | POA: Diagnosis not present

## 2017-07-26 DIAGNOSIS — Z8546 Personal history of malignant neoplasm of prostate: Secondary | ICD-10-CM | POA: Diagnosis not present

## 2017-09-22 DIAGNOSIS — Z23 Encounter for immunization: Secondary | ICD-10-CM | POA: Diagnosis not present

## 2017-09-30 IMAGING — DX DG CHEST 2V
2 series · 2 of 2 positions shown · non-contrast
Comparison: January 25, 2014

CLINICAL DATA: Routine physical examination. Hyperlipidemia.
Hypertension. History of prostate carcinoma.

EXAM:
CHEST  2 VIEW

[chest pa]
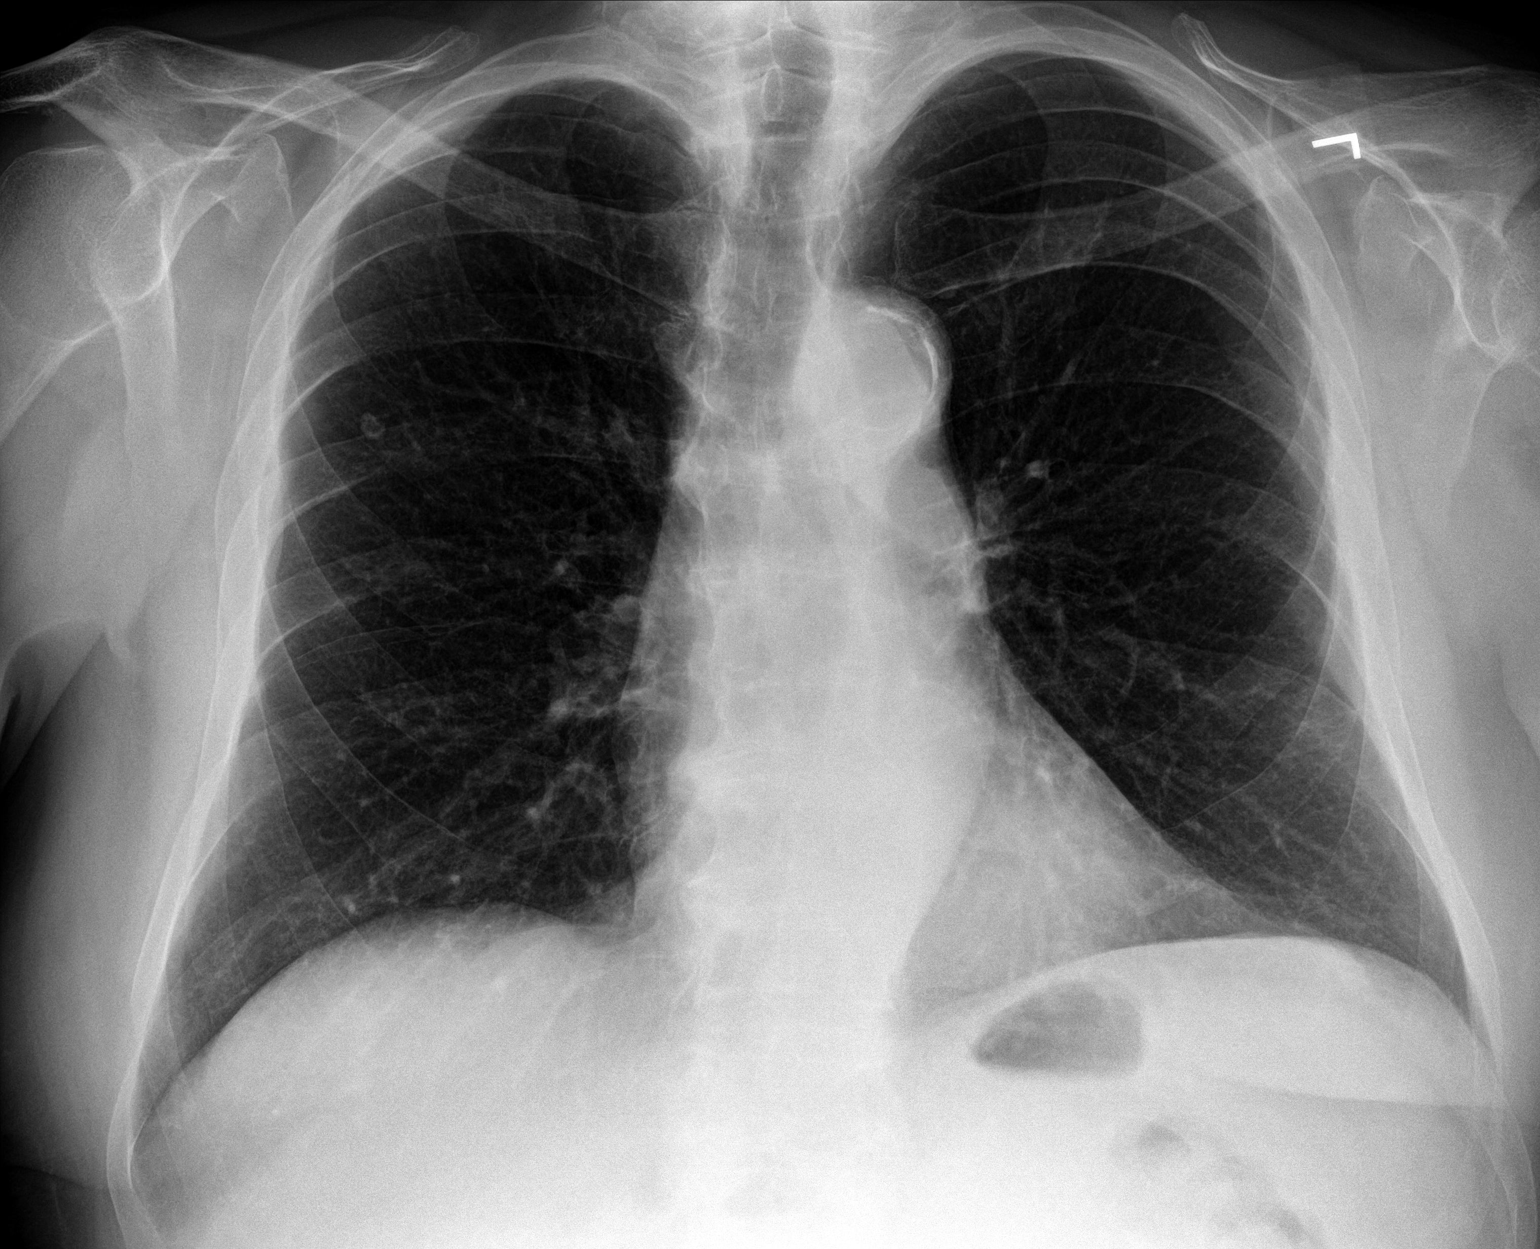

[chest lat]
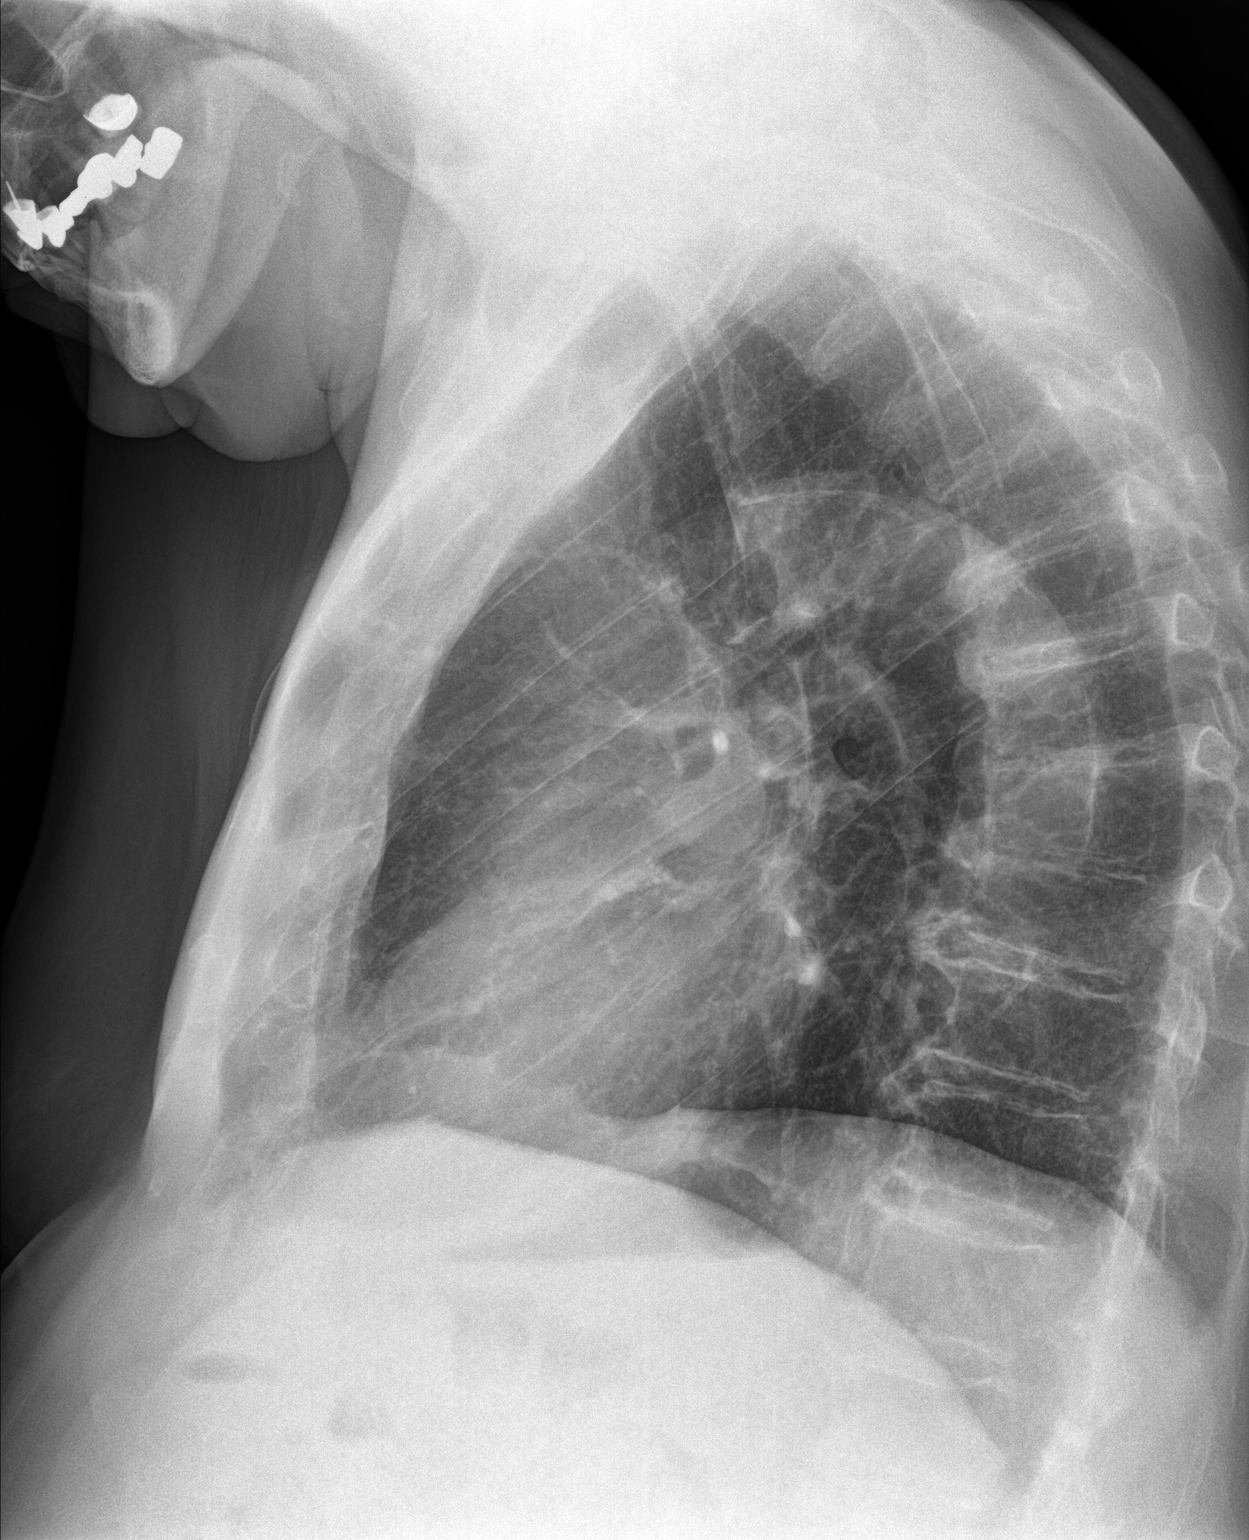

[2 of 2 positions shown; findings below may reference images not displayed]

FINDINGS: There is a stable calcified granuloma in the right upper lobe. The
lungs elsewhere are clear. Heart size and pulmonary vascularity are
normal. No adenopathy. There is atherosclerotic calcification in the
aorta. There is degenerative change in the thoracic spine.
IMPRESSION: Calcified granuloma right upper lobe. No edema or consolidation.
Stable cardiac silhouette.

## 2017-10-05 ENCOUNTER — Other Ambulatory Visit: Payer: Self-pay | Admitting: Nurse Practitioner

## 2017-11-17 ENCOUNTER — Ambulatory Visit (INDEPENDENT_AMBULATORY_CARE_PROVIDER_SITE_OTHER): Payer: Medicare Other | Admitting: Family Medicine

## 2017-11-17 ENCOUNTER — Encounter: Payer: Self-pay | Admitting: Family Medicine

## 2017-11-17 VITALS — BP 113/62 | HR 79 | Temp 96.0°F | Ht 74.0 in | Wt 206.0 lb

## 2017-11-17 DIAGNOSIS — H6123 Impacted cerumen, bilateral: Secondary | ICD-10-CM

## 2017-11-17 DIAGNOSIS — K219 Gastro-esophageal reflux disease without esophagitis: Secondary | ICD-10-CM | POA: Diagnosis not present

## 2017-11-17 DIAGNOSIS — E7800 Pure hypercholesterolemia, unspecified: Secondary | ICD-10-CM

## 2017-11-17 DIAGNOSIS — E559 Vitamin D deficiency, unspecified: Secondary | ICD-10-CM

## 2017-11-17 DIAGNOSIS — I10 Essential (primary) hypertension: Secondary | ICD-10-CM

## 2017-11-17 DIAGNOSIS — R140 Abdominal distension (gaseous): Secondary | ICD-10-CM

## 2017-11-17 DIAGNOSIS — C61 Malignant neoplasm of prostate: Secondary | ICD-10-CM | POA: Diagnosis not present

## 2017-11-17 NOTE — Addendum Note (Signed)
Addended by: Zannie Cove on: 11/17/2017 05:18 PM   Modules accepted: Orders

## 2017-11-17 NOTE — Progress Notes (Signed)
Subjective:    Patient ID: Myrtie Neither, male    DOB: Feb 10, 1923, 83 y.o.   MRN: [id number]  HPI  Pt here for follow up and management of chronic medical problems which includes hyperlipidemia. He is taking medication regularly.  The patient comes today to the visit with his wife.  He is complaining of some swelling in the right leg and is due to get an FOBT and lab work today.  His vital signs are stable but his weight is up 10 pounds since the last visit.  The patient is currently taking lovastatin and vitamin D and has a history of prostate cancer.  Patient is wife are very happy with the changes that have been made and living in a more confined community with assistance if needed.  They are alert and looking healthy.  The patient denies any chest pain or shortness of breath anymore than usual.  He denies any trouble with his stomach including nausea vomiting diarrhea or blood in the stool.  His main complaint is his nocturia.  He is wearing support hose but cannot tell a lot of difference with the support hose.  I emphasized to him again that it was important to put the hose on the first thing with getting up in the morning and propping his feet up some during the day.  He does not eat a lot of salt.  He also complains with a lot of gas and bloating.     Patient Active Problem List   Diagnosis Date Noted  . 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 Heart block 12/18/2016  . Second degree AV block, Mobitz type I 12/17/2016  . Abdominal pain, epigastric 12/17/2016  . Mobitz type 1 second degree atrioventricular block   . Hyperlipemia 08/03/2013  . Depression 08/03/2013  . Vitamin D deficiency 03/07/2011  . GERD (gastroesophageal reflux disease) 03/07/2011  . Prostate cancer ([place]) 01/19/2011  . Essential hypertension 01/19/2011  . Right bundle branch block 01/19/2011  . INTERNAL HEMORRHOIDS 01/19/2011  . DIVERTICULOSIS, COLON 01/19/2011  . COLONIC POLYPS, HX OF 01/19/2011   Outpatient  Encounter Medications as of 11/17/2017  Medication Sig  . Cholecalciferol (VITAMIN D-1000 MAX ST) 1000 units tablet Take 1 capsule by mouth daily.  Marland Kitchen lovastatin (MEVACOR) 20 MG tablet Take 1 tablet by mouth daily.  . [DISCONTINUED] cholecalciferol (VITAMIN D) 1000 units tablet Take 1,000 Units by mouth daily.  . [DISCONTINUED] acetaminophen (TYLENOL) 325 MG tablet Take 2 tablets (650 mg total) by mouth every 6 (six) hours as needed for mild pain (or temp > 100).  . [DISCONTINUED] aspirin 81 MG EC tablet Take 81 mg by mouth daily after supper.    . [DISCONTINUED] Calcium Carb-Cholecalciferol (CALCIUM 600 + D PO) Take 1 tablet by mouth daily.  . [DISCONTINUED] lovastatin (MEVACOR) 20 MG tablet TAKE 1 TABLET DAILY  . [DISCONTINUED] Multiple Vitamin (MULTIVITAMIN) capsule Take 1 capsule by mouth daily.   No facility-administered encounter medications on file as of 11/17/2017.       Review of Systems  Constitutional: Negative.   HENT: Negative.   Eyes: Negative.   Respiratory: Negative.   Cardiovascular: Positive for leg swelling (right leg swelling).  Gastrointestinal: Negative.   Endocrine: Negative.   Genitourinary: Negative.   Musculoskeletal: Negative.   Skin: Negative.   Allergic/Immunologic: Negative.   Neurological: Negative.   Hematological: Negative.   Psychiatric/Behavioral: Negative.        Objective:   Physical Exam  Constitutional: He is oriented to person,  place, and time. He appears well-developed and well-nourished. No distress.  The patient is pleasant and relaxed and seems to be very happy at his new living center.  HENT:  Head: Normocephalic and atraumatic.  Nose: Nose normal.  Mouth/Throat: Oropharynx is clear and moist. No oropharyngeal exudate.  Bilateral hearing aids with wax bilaterally with the right being greater than the left.  Eyes: Conjunctivae and EOM are normal. Pupils are equal, round, and reactive to light. Right eye exhibits no discharge. Left  eye exhibits no discharge. No scleral icterus.  Neck: Normal range of motion. Neck supple. No thyromegaly present.  No bruits thyromegaly or anterior cervical adenopathy  Cardiovascular: Normal rate, regular rhythm, normal heart sounds and intact distal pulses.  No murmur heard. The heart is regular at 72/min  Pulmonary/Chest: Effort normal and breath sounds normal. No respiratory distress. He has no wheezes. He has no rales.  Abdominal: Soft. Bowel sounds are normal. He exhibits no mass. There is no tenderness. There is no rebound and no guarding.  No abdominal tenderness masses organ enlargement or bruits  Genitourinary:  Genitourinary Comments: The patient continues to see Dr. Rosana Hoes regularly because of his history of prostate cancer  Musculoskeletal: Normal range of motion. He exhibits edema. He exhibits no tenderness.  There is 2+ pretibial edema on the right even with support hose.  Lymphadenopathy:    He has no cervical adenopathy.  Neurological: He is alert and oriented to person, place, and time. He has normal reflexes. No cranial nerve deficit.  Skin: Skin is warm and dry. No rash noted.  Psychiatric: He has a normal mood and affect. His behavior is normal. Judgment and thought content normal.  Nursing note and vitals reviewed.  BP 113/62 (BP Location: Left Arm)   Pulse 79   Temp (!) 96 F (35.6 C) (Oral)   Ht 6\' 2"  (1.88 m)   Wt 206 lb (93.4 kg)   BMI 26.45 kg/m         Assessment & Plan:  1. Pure hypercholesterolemia -Continue with lovastatin therapeutic lifestyle changes as much as possible  2. Vitamin D deficiency -Continue with vitamin D replacement pending results of lab work  3. Gastroesophageal reflux disease, esophagitis presence not specified -No complaints with this today and the patient is currently not taking anything for this.  4. Essential hypertension -Blood pressure is good today and he will continue to watch his sodium intake  5. Prostate  cancer ([place]) -Continue to follow-up with urology  6. Abdominal distension (gaseous) -Start probiotic 1 daily like align and watch milk cheese ice cream and dairy products as much as possible and reduce caffeine as much as possible.  7. Excessive cerumen in ear canal, bilateral -Ear irrigation at patient's convenience  No orders of the defined types were placed in this encounter.  Patient Instructions                       Medicare Annual Wellness Visit  [facility] and the medical providers at [facility] strive to bring you the best medical care.  In doing so we not only want to address your current medical conditions and concerns but also to detect new conditions early and prevent illness, disease and health-related problems.    Medicare offers a yearly Wellness Visit which allows our clinical staff to assess your need for preventative services including immunizations, lifestyle education, counseling to decrease risk of preventable diseases and screening for fall risk and  other medical concerns.    This visit is provided free of charge (no copay) for all Medicare recipients. The clinical pharmacists at [facility] have begun to conduct these Wellness Visits which will also include a thorough review of all your medications.    As you primary medical provider recommend that you make an appointment for your Annual Wellness Visit if you have not done so already this year.  You may set up this appointment before you leave today or you may call back ([phone]) and schedule an appointment.  Please make sure when you call that you mention that you are scheduling your Annual Wellness Visit with the clinical pharmacist so that the appointment may be made for the proper length of time.     Continue current medications. Continue good therapeutic lifestyle changes which include good diet and exercise. Fall precautions discussed with patient. If an FOBT was  given today- please return it to our front desk. If you are over 17 years old - you may need Prevnar 26 or the adult Pneumonia vaccine.  **Flu shots are available--- please call and schedule a FLU-CLINIC appointment**  After your visit with [place] today you will receive a survey in the mail or online from [employer] regarding your care with [place]. Please take a moment to fill this out. Your feedback is very important to [place] as you can help [place] better understand your patient needs as well as improve your experience and satisfaction. WE CARE ABOUT YOU!!!   Continue to drink plenty of fluids but reduce the amount of fluids after 7 or 8:00 in the evening Try to prop her legs up higher than the level of your heart sometime during the day for at least 20 minutes may be in the morning and late afternoon. Continue to watch sodium intake If the fluid continues to accumulate please get back in touch with [place] as we may want to try a low dose of Lasix periodically to help get rid of the fluid. Stay is active physically as possible. Please pick up a probiotic at the drugstore the next time you go there and a good brand name is align, they may have a generic version of this.  If they do this would be cheaper and you would take 1 daily. The patient will return to the clinic at his convenience for ear irrigation bilaterally   Arrie Senate MD

## 2017-11-17 NOTE — Patient Instructions (Addendum)
Medicare Annual Wellness Visit  [facility] and the medical providers at [facility] strive to bring you the best medical care.  In doing so we not only want to address your current medical conditions and concerns but also to detect new conditions early and prevent illness, disease and health-related problems.    Medicare offers a yearly Wellness Visit which allows our clinical staff to assess your need for preventative services including immunizations, lifestyle education, counseling to decrease risk of preventable diseases and screening for fall risk and other medical concerns.    This visit is provided free of charge (no copay) for all Medicare recipients. The clinical pharmacists at [facility] have begun to conduct these Wellness Visits which will also include a thorough review of all your medications.    As you primary medical provider recommend that you make an appointment for your Annual Wellness Visit if you have not done so already this year.  You may set up this appointment before you leave today or you may call back ([phone]) and schedule an appointment.  Please make sure when you call that you mention that you are scheduling your Annual Wellness Visit with the clinical pharmacist so that the appointment may be made for the proper length of time.     Continue current medications. Continue good therapeutic lifestyle changes which include good diet and exercise. Fall precautions discussed with patient. If an FOBT was given today- please return it to our front desk. If you are over 64 years old - you may need Prevnar 62 or the adult Pneumonia vaccine.  **Flu shots are available--- please call and schedule a FLU-CLINIC appointment**  After your visit with [place] today you will receive a survey in the mail or online from [employer] regarding your care with [place]. Please take a moment to fill this out. Your feedback is very  important to [place] as you can help [place] better understand your patient needs as well as improve your experience and satisfaction. WE CARE ABOUT YOU!!!   Continue to drink plenty of fluids but reduce the amount of fluids after 7 or 8:00 in the evening Try to prop her legs up higher than the level of your heart sometime during the day for at least 20 minutes may be in the morning and late afternoon. Continue to watch sodium intake If the fluid continues to accumulate please get back in touch with [place] as we may want to try a low dose of Lasix periodically to help get rid of the fluid. Stay is active physically as possible. Please pick up a probiotic at the drugstore the next time you go there and a good brand name is align, they may have a generic version of this.  If they do this would be cheaper and you would take 1 daily. The patient will return to the clinic at his convenience for ear irrigation bilaterally

## 2017-11-18 LAB — LIPID PANEL
CHOLESTEROL TOTAL: 147 mg/dL (ref 100–199)
Chol/HDL Ratio: 3.4 ratio (ref 0.0–5.0)
HDL: 43 mg/dL (ref >39)
LDL CALC: 74 mg/dL (ref 0–99)
TRIGLYCERIDES: 150 mg/dL — AB (ref 0–149)
VLDL Cholesterol Cal: 30 mg/dL (ref 5–40)

## 2017-11-18 LAB — CBC WITH DIFFERENTIAL/PLATELET
BASOS ABS: 0 10*3/uL (ref 0.0–0.2)
Basos: 1 %
EOS (ABSOLUTE): 0.1 10*3/uL (ref 0.0–0.4)
Eos: 2 %
Hematocrit: 40.6 % (ref 37.5–51.0)
Hemoglobin: 14 g/dL (ref 13.0–17.7)
Immature Grans (Abs): 0 10*3/uL (ref 0.0–0.1)
Immature Granulocytes: 0 %
LYMPHS ABS: 1.6 10*3/uL (ref 0.7–3.1)
LYMPHS: 21 %
MCH: 31.6 pg (ref 26.6–33.0)
MCHC: 34.5 g/dL (ref 31.5–35.7)
MCV: 92 fL (ref 79–97)
Monocytes Absolute: 0.7 10*3/uL (ref 0.1–0.9)
Monocytes: 9 %
Neutrophils Absolute: 5.2 10*3/uL (ref 1.4–7.0)
Neutrophils: 67 %
PLATELETS: 284 10*3/uL (ref 150–379)
RBC: 4.43 x10E6/uL (ref 4.14–5.80)
RDW: 13.8 % (ref 12.3–15.4)
WBC: 7.7 10*3/uL (ref 3.4–10.8)

## 2017-11-18 LAB — BMP8+EGFR
BUN/Creatinine Ratio: 12 (ref 10–24)
BUN: 14 mg/dL (ref 10–36)
CALCIUM: 9 mg/dL (ref 8.6–10.2)
CHLORIDE: 101 mmol/L (ref 96–106)
CO2: 26 mmol/L (ref 20–29)
Creatinine, Ser: 1.14 mg/dL (ref 0.76–1.27)
GFR calc Af Amer: 64 mL/min/{1.73_m2} (ref >59)
GFR calc non Af Amer: 56 mL/min/{1.73_m2} — ABNORMAL LOW (ref >59)
Glucose: 92 mg/dL (ref 65–99)
POTASSIUM: 4.2 mmol/L (ref 3.5–5.2)
Sodium: 140 mmol/L (ref 134–144)

## 2017-11-18 LAB — HEPATIC FUNCTION PANEL
ALBUMIN: 3.9 g/dL (ref 3.2–4.6)
ALT: 11 IU/L (ref 0–44)
AST: 23 IU/L (ref 0–40)
Alkaline Phosphatase: 92 IU/L (ref 39–117)
BILIRUBIN TOTAL: 0.2 mg/dL (ref 0.0–1.2)
BILIRUBIN, DIRECT: 0.11 mg/dL (ref 0.00–0.40)
TOTAL PROTEIN: 6.6 g/dL (ref 6.0–8.5)

## 2017-11-18 LAB — VITAMIN D 25 HYDROXY (VIT D DEFICIENCY, FRACTURES): Vit D, 25-Hydroxy: 46.4 ng/mL (ref 30.0–100.0)

## 2017-11-18 MED ORDER — PROBIOTIC DAILY PO CAPS: 1.0000 | ORAL_CAPSULE | Freq: Every day | ORAL | 1 refills | Status: DC

## 2017-11-18 NOTE — Addendum Note (Signed)
Addended by: Zannie Cove on: 11/18/2017 09:42 AM   Modules accepted: Orders

## 2018-02-03 ENCOUNTER — Other Ambulatory Visit: Payer: Self-pay | Admitting: Family Medicine

## 2018-03-16 ENCOUNTER — Encounter: Payer: Self-pay | Admitting: Family Medicine

## 2018-03-16 ENCOUNTER — Ambulatory Visit (INDEPENDENT_AMBULATORY_CARE_PROVIDER_SITE_OTHER): Payer: Medicare Other | Admitting: Family Medicine

## 2018-03-16 VITALS — BP 108/60 | HR 81 | Temp 96.6°F | Ht 74.0 in | Wt 219.0 lb

## 2018-03-16 DIAGNOSIS — E7800 Pure hypercholesterolemia, unspecified: Secondary | ICD-10-CM | POA: Diagnosis not present

## 2018-03-16 DIAGNOSIS — H6123 Impacted cerumen, bilateral: Secondary | ICD-10-CM

## 2018-03-16 DIAGNOSIS — K219 Gastro-esophageal reflux disease without esophagitis: Secondary | ICD-10-CM

## 2018-03-16 DIAGNOSIS — L209 Atopic dermatitis, unspecified: Secondary | ICD-10-CM | POA: Diagnosis not present

## 2018-03-16 DIAGNOSIS — E559 Vitamin D deficiency, unspecified: Secondary | ICD-10-CM | POA: Diagnosis not present

## 2018-03-16 DIAGNOSIS — I10 Essential (primary) hypertension: Secondary | ICD-10-CM

## 2018-03-16 DIAGNOSIS — C61 Malignant neoplasm of prostate: Secondary | ICD-10-CM

## 2018-03-16 DIAGNOSIS — H9193 Unspecified hearing loss, bilateral: Secondary | ICD-10-CM

## 2018-03-16 NOTE — Progress Notes (Signed)
Subjective:    Patient ID: Alec Davis, male    DOB: 10-01-1923, 84 y.o.   MRN: [id number]  HPI Pt here for follow up and management of chronic medical problems which includes hyperlipidemia. He is taking medication regularly.  The patient comes to the visit today with his wife.  He is still followed by Dr. Rosana Hoes his urologist.  He has no complaints and needs no refills on his vital signs are stable.  He will get lab work today.  He is on a statin drug and vitamin D replacement.  The patient is doing well.  He is happy with his location.  He has no complaints.  They eat lunch prepared by the facility and eat breakfast and dinner in the rooms and walk in the hallways. He denies any chest pain or shortness of breath.  He denies any trouble with nausea vomiting diarrhea blood in the stool black tarry bowel movements or change in bowel habits.  He is passing his water without problems.  He is not sure if he has to go back and see the urologist anymore because he does have prostate cancer.  I told him to find out and we would do one more PSA on him at the next visit just to gauge where we are with the PSA at this point in time.  He and his wife are both up-to-date on their eye exams.  The patient is concerned about earwax affecting his hearing.  He only wears one hearing aid on the right side.     Patient Active Problem List   Diagnosis Date Noted  . 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 Heart block 12/18/2016  . Second degree AV block, Mobitz type I 12/17/2016  . Abdominal pain, epigastric 12/17/2016  . Mobitz type 1 second degree atrioventricular block   . Hyperlipemia 08/03/2013  . Depression 08/03/2013  . Vitamin D deficiency 03/07/2011  . GERD (gastroesophageal reflux disease) 03/07/2011  . Prostate cancer ([place]) 01/19/2011  . Essential hypertension 01/19/2011  . Right bundle branch block 01/19/2011  . INTERNAL HEMORRHOIDS 01/19/2011  . DIVERTICULOSIS, COLON 01/19/2011  . COLONIC  POLYPS, HX OF 01/19/2011   Outpatient Encounter Medications as of 03/16/2018  Medication Sig  . Cholecalciferol (VITAMIN D-1000 MAX ST) 1000 units tablet Take 1 capsule by mouth daily.  Marland Kitchen lovastatin (MEVACOR) 20 MG tablet TAKE 1 TABLET DAILY  . Probiotic Product (PROBIOTIC DAILY) CAPS Take 1 capsule by mouth daily.  . [DISCONTINUED] lovastatin (MEVACOR) 20 MG tablet Take 1 tablet by mouth daily.   No facility-administered encounter medications on file as of 03/16/2018.       Review of Systems  Constitutional: Negative.   HENT: Negative.   Eyes: Negative.   Respiratory: Negative.   Cardiovascular: Negative.   Gastrointestinal: Negative.   Endocrine: Negative.   Genitourinary: Negative.   Musculoskeletal: Negative.   Skin: Negative.   Allergic/Immunologic: Negative.   Neurological: Negative.   Hematological: Negative.   Psychiatric/Behavioral: Negative.        Objective:   Physical Exam  Constitutional: He is oriented to person, place, and time. He appears well-developed and well-nourished. No distress.  The patient is pleasant and relaxed and in very good spirits for his age of 84 years.  HENT:  Head: Normocephalic and atraumatic.  Nose: Nose normal.  Mouth/Throat: Oropharynx is clear and moist. No oropharyngeal exudate.  Bilateral ear cerumen  Eyes: Pupils are equal, round, and reactive to light. Conjunctivae and EOM are normal. Right eye  exhibits no discharge. Left eye exhibits no discharge. No scleral icterus.  Up-to-date on eye exam  Neck: Normal range of motion. Neck supple. No thyromegaly present.  No thyromegaly bruits or anterior cervical adenopathy  Cardiovascular: Normal rate, regular rhythm, normal heart sounds and intact distal pulses.  No murmur heard. The heart has a regular rate and rhythm at 72/min  Pulmonary/Chest: Effort normal and breath sounds normal. No respiratory distress. He has no wheezes. He has no rales. He exhibits no tenderness.  Lungs are  clear anteriorly and posteriorly with no axillary adenopathy  Abdominal: Soft. Bowel sounds are normal. He exhibits no mass. There is no tenderness. There is no rebound and no guarding.  No masses tenderness or organ enlargement or bruits.  There are some excoriations on the abdominal wall.  Musculoskeletal: Normal range of motion. He exhibits no edema.  The patient complains of some edema but there was no pitting edema.  Just some generalized swelling without edema.  Lymphadenopathy:    He has no cervical adenopathy.  Neurological: He is alert and oriented to person, place, and time. He has normal reflexes. No cranial nerve deficit.  Skin: Skin is warm and dry. Rash noted. There is erythema. No pallor.  Very dry skin on feet and abdomen excoriations on abdominal wall  Psychiatric: He has a normal mood and affect. His behavior is normal. Judgment and thought content normal.  Nursing note and vitals reviewed.  BP 108/60 (BP Location: Left Arm)   Pulse 81   Temp (!) 96.6 F (35.9 C) (Oral)   Ht _0  (1.88 m)   Wt 219 lb (99.3 kg)   BMI 28.12 kg/m         Assessment & Plan:  1. Gastroesophageal reflux disease, esophagitis presence not specified -Continue with current treatment - CBC with Differential/Platelet - Hepatic function panel  2. Pure hypercholesterolemia -Continue with lovastatin and with as aggressive therapeutic lifestyle changes as possible - CBC with Differential/Platelet - Lipid panel  3. Vitamin D deficiency -Continue with vitamin D replacement pending results of lab work - CBC with Differential/Platelet - VITAMIN D 25 Hydroxy (Vit-D Deficiency, Fractures)  4. Essential hypertension -Blood pressure is good today and he will continue to watch his sodium intake as much as possible - CBC with Differential/Platelet - BMP8+EGFR - Hepatic function panel  5. Prostate cancer [facility]) -Follow-up with Dr. Rosana Hoes as recommended - CBC with Differential/Platelet  6.  Atopic dermatitis, unspecified type -Avoid soaps fabric softeners and detergents that are not scent free  7. Excessive cerumen in ear canal, bilateral -Ear irrigation to remove ear wax from each ear canal  8. Decreased hearing of both ears -Continue with hearing aid and periodically use Debrox wax softener to keep wax softened up so we can irrigate if necessary when we see the patient in the office  Patient Instructions                       Medicare Annual Wellness Visit  [facility] and the medical providers at [facility] strive to bring you the best medical care.  In doing so we not only want to address your current medical conditions and concerns but also to detect new conditions early and prevent illness, disease and health-related problems.    Medicare offers a yearly Wellness Visit which allows our clinical staff to assess your need for preventative services including immunizations, lifestyle education, counseling to decrease risk of preventable diseases and screening  for fall risk and other medical concerns.    This visit is provided free of charge (no copay) for all Medicare recipients. The clinical pharmacists at [facility] have begun to conduct these Wellness Visits which will also include a thorough review of all your medications.    As you primary medical provider recommend that you make an appointment for your Annual Wellness Visit if you have not done so already this year.  You may set up this appointment before you leave today or you may call back ([phone]) and schedule an appointment.  Please make sure when you call that you mention that you are scheduling your Annual Wellness Visit with the clinical pharmacist so that the appointment may be made for the proper length of time.     Continue current medications. Continue good therapeutic lifestyle changes which include good diet and exercise. Fall precautions discussed with  patient. If an FOBT was given today- please return it to our front desk. If you are over 77 years old - you may need Prevnar 57 or the adult Pneumonia vaccine.  **Flu shots are available--- please call and schedule a FLU-CLINIC appointment**  After your visit with [place] today you will receive a survey in the mail or online from [employer] regarding your care with [place]. Please take a moment to fill this out. Your feedback is very important to [place] as you can help [place] better understand your patient needs as well as improve your experience and satisfaction. WE CARE ABOUT YOU!!!   Follow-up with urology as planned Stay is active physically as possible Even though you have to get up at nighttime still try to drink lots of water and stay well-hydrated Watch salt intake closely Wear support hose and put these on the first thing with arising from the bed in the morning Please make sure that you use soaps fabric softeners and detergents that are scent free.  [employer]ove or [employer].  All dermatology approved detergent, and fabric softeners with out scent  Arrie Senate MD

## 2018-03-16 NOTE — Patient Instructions (Addendum)
Medicare Annual Wellness Visit  [facility] and the medical providers at [facility] strive to bring you the best medical care.  In doing so we not only want to address your current medical conditions and concerns but also to detect new conditions early and prevent illness, disease and health-related problems.    Medicare offers a yearly Wellness Visit which allows our clinical staff to assess your need for preventative services including immunizations, lifestyle education, counseling to decrease risk of preventable diseases and screening for fall risk and other medical concerns.    This visit is provided free of charge (no copay) for all Medicare recipients. The clinical pharmacists at [facility] have begun to conduct these Wellness Visits which will also include a thorough review of all your medications.    As you primary medical provider recommend that you make an appointment for your Annual Wellness Visit if you have not done so already this year.  You may set up this appointment before you leave today or you may call back ([phone]) and schedule an appointment.  Please make sure when you call that you mention that you are scheduling your Annual Wellness Visit with the clinical pharmacist so that the appointment may be made for the proper length of time.     Continue current medications. Continue good therapeutic lifestyle changes which include good diet and exercise. Fall precautions discussed with patient. If an FOBT was given today- please return it to our front desk. If you are over 17 years old - you may need Prevnar 60 or the adult Pneumonia vaccine.  **Flu shots are available--- please call and schedule a FLU-CLINIC appointment**  After your visit with [place] today you will receive a survey in the mail or online from [employer] regarding your care with [place]. Please take a moment to fill this out. Your feedback is very  important to [place] as you can help [place] better understand your patient needs as well as improve your experience and satisfaction. WE CARE ABOUT YOU!!!   Follow-up with urology as planned Stay is active physically as possible Even though you have to get up at nighttime still try to drink lots of water and stay well-hydrated Watch salt intake closely Wear support hose and put these on the first thing with arising from the bed in the morning Please make sure that you use soaps fabric softeners and detergents that are scent free.  [employer] or [employer].  All dermatology approved detergent, and fabric softeners with out scent

## 2018-03-17 LAB — CBC WITH DIFFERENTIAL/PLATELET
BASOS ABS: 0 10*3/uL (ref 0.0–0.2)
Basos: 1 %
EOS (ABSOLUTE): 0.2 10*3/uL (ref 0.0–0.4)
EOS: 2 %
HEMOGLOBIN: 14.2 g/dL (ref 13.0–17.7)
Hematocrit: 41.2 % (ref 37.5–51.0)
IMMATURE GRANULOCYTES: 0 %
Immature Grans (Abs): 0 10*3/uL (ref 0.0–0.1)
LYMPHS ABS: 1.9 10*3/uL (ref 0.7–3.1)
Lymphs: 26 %
MCH: 31.8 pg (ref 26.6–33.0)
MCHC: 34.5 g/dL (ref 31.5–35.7)
MCV: 92 fL (ref 79–97)
MONOCYTES: 10 %
Monocytes Absolute: 0.7 10*3/uL (ref 0.1–0.9)
NEUTROS PCT: 61 %
Neutrophils Absolute: 4.4 10*3/uL (ref 1.4–7.0)
Platelets: 281 10*3/uL (ref 150–379)
RBC: 4.46 x10E6/uL (ref 4.14–5.80)
RDW: 14.3 % (ref 12.3–15.4)
WBC: 7.2 10*3/uL (ref 3.4–10.8)

## 2018-03-17 LAB — BMP8+EGFR
BUN/Creatinine Ratio: 9 — ABNORMAL LOW (ref 10–24)
BUN: 9 mg/dL — ABNORMAL LOW (ref 10–36)
CALCIUM: 9.2 mg/dL (ref 8.6–10.2)
CO2: 25 mmol/L (ref 20–29)
CREATININE: 1.04 mg/dL (ref 0.76–1.27)
Chloride: 100 mmol/L (ref 96–106)
GFR calc Af Amer: 72 mL/min/{1.73_m2} (ref >59)
GFR calc non Af Amer: 62 mL/min/{1.73_m2} (ref >59)
GLUCOSE: 84 mg/dL (ref 65–99)
POTASSIUM: 4.2 mmol/L (ref 3.5–5.2)
SODIUM: 139 mmol/L (ref 134–144)

## 2018-03-17 LAB — LIPID PANEL
CHOLESTEROL TOTAL: 142 mg/dL (ref 100–199)
Chol/HDL Ratio: 3.5 ratio (ref 0.0–5.0)
HDL: 41 mg/dL (ref >39)
LDL CALC: 71 mg/dL (ref 0–99)
TRIGLYCERIDES: 149 mg/dL (ref 0–149)
VLDL CHOLESTEROL CAL: 30 mg/dL (ref 5–40)

## 2018-03-17 LAB — HEPATIC FUNCTION PANEL
ALBUMIN: 3.9 g/dL (ref 3.2–4.6)
ALT: 11 IU/L (ref 0–44)
AST: 18 IU/L (ref 0–40)
Alkaline Phosphatase: 100 IU/L (ref 39–117)
Bilirubin Total: 0.2 mg/dL (ref 0.0–1.2)
Bilirubin, Direct: 0.07 mg/dL (ref 0.00–0.40)
TOTAL PROTEIN: 6.7 g/dL (ref 6.0–8.5)

## 2018-03-17 LAB — VITAMIN D 25 HYDROXY (VIT D DEFICIENCY, FRACTURES): VIT D 25 HYDROXY: 55.2 ng/mL (ref 30.0–100.0)

## 2018-04-01 ENCOUNTER — Ambulatory Visit: Payer: Medicare Other

## 2018-04-06 DIAGNOSIS — L720 Epidermal cyst: Secondary | ICD-10-CM | POA: Diagnosis not present

## 2018-04-28 ENCOUNTER — Telehealth: Payer: Self-pay | Admitting: Family Medicine

## 2018-04-28 IMAGING — CR DG CHEST 1V
1 series · 1 of 1 positions shown · non-contrast
Comparison: Chest radiograph from one day prior.

CLINICAL DATA: Pneumonia

EXAM:
CHEST 1 VIEW

[AP]
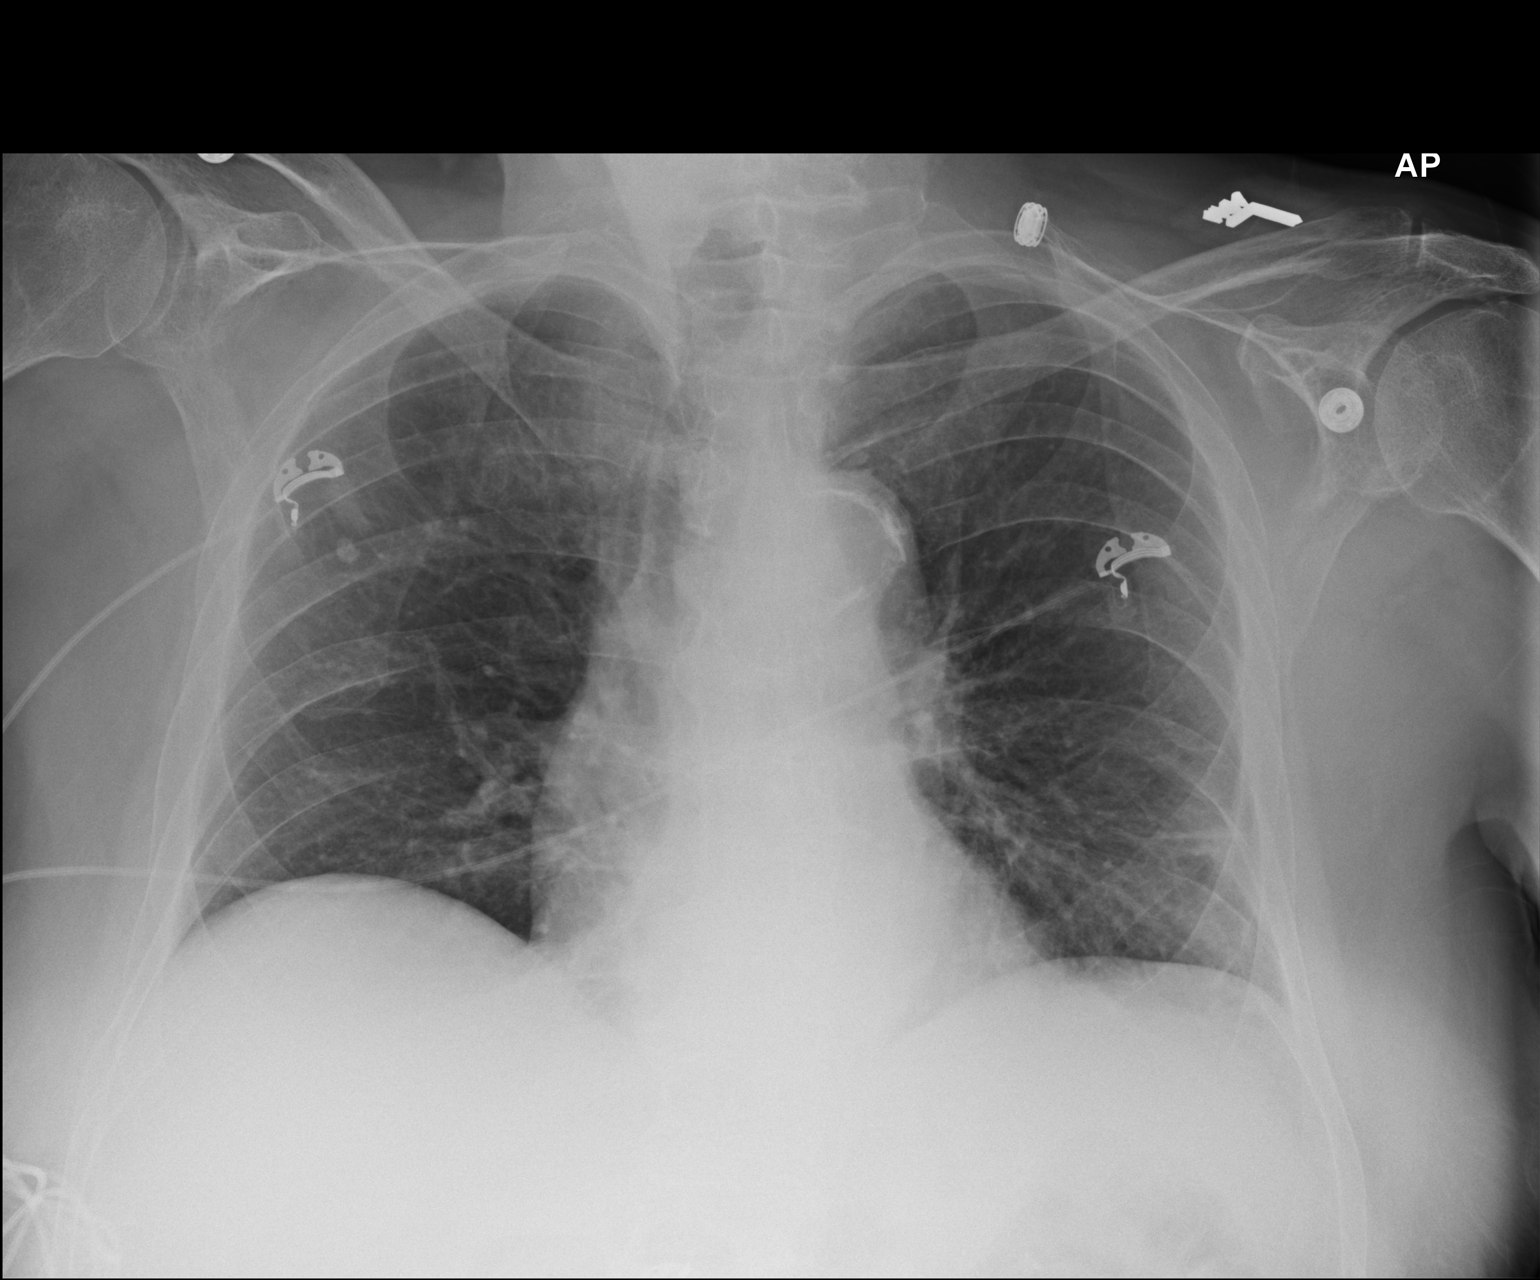

[1 of 1 positions shown; findings below may reference images not displayed]

FINDINGS: Stable cardiomediastinal silhouette with normal heart size and
aortic atherosclerosis. No pneumothorax. No pleural effusion.
Slightly low lung volumes. Stable subcentimeter granuloma in the
peripheral upper right lung. Mild hazy opacity in the medial upper
right lung appears slightly increased. Otherwise no consolidative
airspace disease.
IMPRESSION: 1. Mild hazy opacity in the medial upper right lung, slightly
increased, potentially a developing pneumonia. Recommend follow-up
PA and lateral post treatment chest radiographs in 4-6 weeks.
2. Aortic atherosclerosis.

## 2018-04-28 NOTE — Telephone Encounter (Signed)
[name] is pts daughter and is calling to speak to Roselyn Reef about the apt the pt has tomorrow. She is aware that Roselyn Reef is out of the office today. [name] states she has medical power attorney.

## 2018-04-28 NOTE — Telephone Encounter (Signed)
Spoke with daughter, only wanted to know what appt tomorrow was for

## 2018-04-29 ENCOUNTER — Ambulatory Visit (INDEPENDENT_AMBULATORY_CARE_PROVIDER_SITE_OTHER): Payer: Medicare Other | Admitting: *Deleted

## 2018-04-29 VITALS — BP 101/57 | HR 69 | Temp 97.4°F | Ht 74.0 in | Wt 220.0 lb

## 2018-04-29 DIAGNOSIS — Z0000 Encounter for general adult medical examination without abnormal findings: Secondary | ICD-10-CM

## 2018-04-29 NOTE — Progress Notes (Signed)
Subjective:   GLENROY CROSSEN is a 84 y.o. male who presents for Medicare Annual/Subsequent preventive examination. 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 He used to enjoy photography, playing golf and swimming in his free time, but his age has altered his ability to do some of this. He walks daily, short distances. He states that his diet is healthy and he gets 3 meals a day. He is not active in [employer], nor the community. He lives in a assisted living apartment with his wife, Terrence Dupont. They have two children, one lives in [place] and the other in [place]. He does not have any pets, although the retirement community allows them. We discussed fall hazards and risks today. He states that his health is about the same as a year ago.   Cardiac Risk Factors include: none     Objective:    Vitals: BP (!) 101/57 (BP Location: Left Arm)   Pulse 69   Temp (!) 97.4 F (36.3 C) (Oral)   Ht 6\' 2"  (1.88 m)   Wt 220 lb (99.8 kg)   BMI 28.25 kg/m   Body mass index is 28.25 kg/m.  Advanced Directives 04/29/2018 04/06/2017 04/02/2017 04/01/2017 12/22/2016 09/11/2015  Does Patient Have a Medical Advance Directive? No;Yes - Yes Yes Yes Yes  Type of Paramedic of [place];Living will Living will;Healthcare Power of Attorney Living will;Healthcare Power of Attorney Living will Living will [place];Living will  Does patient want to make changes to medical advance directive? - No - Patient declined - No - Patient declined No - Patient declined No - Patient declined  Copy of [place] in Chart? No - copy requested - - - - No - copy requested  Would patient like information on creating a medical advance directive? - - - No - Patient declined - -    Tobacco Social History   Tobacco Use  Smoking Status Former Smoker  . Last attempt to quit: 01/25/1950  .  Years since quitting: 68.3  Smokeless Tobacco Never Used     Counseling given: Not Answered   Past Medical History:  Diagnosis Date  . BPH (benign prostatic hyperplasia)   . Cataract   . GERD (gastroesophageal reflux disease)   . Hyperlipidemia   . Hypertension   . PROSTATE CANCER 01/19/2011   Qualifier: Diagnosis of  By: Nils Pyle CMA ([place]), [name]    . Vitamin D deficiency    Past Surgical History:  Procedure Laterality Date  . cataracts  Bilateral    Dr [name]  . 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 SPINE SURGERY     in [place] age 47    Family History  Problem Relation Age of Onset  . Suicidality Father   . Alzheimer's disease Brother   . Other Brother        polio  . Arthritis Son        shoulders    Social History   Socioeconomic History  . Marital status: Married    Spouse name: emma  . Number of children: 2  . Years of education: Not on file  . Highest education level: Not on file  Occupational History  . 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 Financial resource strain: Not on file  . Food insecurity:    Worry: Not on file    Inability: Not on file  . Transportation needs:    Medical: Not on file    Non-medical: Not on file  Tobacco Use  . Smoking status: Former Smoker    Last attempt to quit: 01/25/1950    Years since quitting: 68.3  . Smokeless tobacco: Never Used  Substance and Sexual Activity  . Alcohol use: No  . Drug use: No  . Sexual activity: Never  Lifestyle  . Physical activity:    Days per week: Not on file    Minutes per session: Not on file  . Stress: Not on file  Relationships  . Social connections:    Talks on phone: Not on file    Gets together: Not on file    Attends religious service: Not on file    Active member  of club or organization: Not on file    Attends meetings of clubs or organizations: Not on file    Relationship status: Not on file  Other Topics Concern  . Not on file  Social History Narrative  . Not on file    Outpatient Encounter Medications as of 04/29/2018  Medication Sig  . Cholecalciferol (VITAMIN D-1000 MAX ST) 1000 units tablet Take 1 capsule by mouth daily.  Marland Kitchen lovastatin (MEVACOR) 20 MG tablet TAKE 1 TABLET DAILY  . Probiotic Product (PROBIOTIC DAILY) CAPS Take 1 capsule by mouth daily.   No facility-administered encounter medications on file as of 04/29/2018.     Activities of Daily Living In your present state of health, do you have any difficulty performing the following activities: 04/29/2018  Hearing? Y  Comment wears heaing aids   Vision? Y  Comment wears RX glasses   Difficulty concentrating or making decisions? Y  Walking or climbing stairs? N  Dressing or bathing? N  Doing errands, shopping? N  Preparing Food and eating ? N  Using the Toilet? N  In the past six months, have you accidently leaked urine? N  Do you have problems with loss of bowel control? N  Managing your Medications? N  Managing your Finances? N  Housekeeping or managing your Housekeeping? N  Some recent data might be hidden    Patient Care Team: Chipper Herb, MD as PCP - General (Family Medicine) Myrlene Broker, MD (Urology) Warden Fillers, MD as Consulting Physician (Ophthalmology)   Assessment:   This is a routine wellness examination for Saketh.  Exercise Activities and Dietary recommendations Current Exercise Habits: Home exercise routine, Type of exercise: walking, Time (Minutes): 15, Frequency (Times/Week): 5, Weekly Exercise (Minutes/Week): 75, Intensity: Mild  Goals    . Prevent falls     Stay active  Stay self- sufficient        Fall Risk Fall Risk  04/29/2018 03/16/2018 11/17/2017 07/19/2017 03/10/2017  Falls in the past year? No No No Yes No  Number falls  in past yr: - - - 1 -  Injury with Fall? - - - No -   Is the patient's home free of loose throw rugs in walkways, pet beds, electrical cords, etc?   Fall hazards and risks were discussed today.  Depression Screen PHQ 2/9 Scores 04/29/2018 03/16/2018 11/17/2017 07/19/2017  PHQ - 2 Score 0 0 0 0    Cognitive Function MMSE - Mini Mental State Exam 04/29/2018 09/11/2015  Orientation to time 5 4  Orientation to Place 5 5  Registration 3 3  Attention/ Calculation 5 5  Recall 1 2  Language- name 2 objects 2 2  Language- repeat 1 1  Language- follow 3 step command 3 2  Language- read & follow direction 1 1  Write a sentence 1 1  Copy design 1 1  Total score 28 27        Immunization History  Administered Date(s) Administered  . Influenza Whole 08/21/2010  . Influenza-Unspecified 09/24/2015, 09/21/2016  . Pneumococcal Conjugate-13 01/25/2014  . Pneumococcal Polysaccharide-23 02/19/1999  . Td 12/22/2003  . Zoster 12/21/2005    Qualifies for Shingles Vaccine? declined  Screening Tests Health Maintenance  Topic Date Due  . INFLUENZA VACCINE  07/21/2018  . TETANUS/TDAP  09/20/2021  . PNA vac Low Risk Adult  Completed   Cancer Screenings: Lung: Low Dose CT Chest recommended if Age 27-80 years, 30 pack-year currently smoking OR have quit w/in 15years. Patient does qualify. Colorectal: due at next OV  Additional Screenings: declined Hepatitis C Screening:      Plan:   pt is to keep follow up with Dr Laurance Flatten and other specialist He is to try not to fall and is to stay active. He refuses some health maint. (noted) due to age.  I have personally reviewed and noted the following in the patient's chart:   . Medical and social history . Use of alcohol, tobacco or illicit drugs  . Current medications and supplements . Functional ability and status . Nutritional status . Physical activity . Advanced directives . List of other physicians . Hospitalizations, surgeries, and ER  visits in previous 12 months . Vitals . Screenings to include cognitive, depression, and falls . Referrals and appointments  In addition, I have reviewed and discussed with patient certain preventive protocols, quality metrics, and best practice recommendations. A written personalized care plan for preventive services as well as general preventive health recommendations were provided to patient.     H[place]ley Dec, [place]  [id number]/76/8115

## 2018-04-29 NOTE — Patient Instructions (Addendum)
  Mr. Fullilove , Thank you for taking time to come for your Medicare Wellness Visit. I appreciate your ongoing commitment to your health goals. Please review the following plan we discussed and let me know if I can assist you in the future.   These are the goals we discussed: Goals    None     stay active and prevent falls   This is a list of the screening recommended for you and due dates:  Health Maintenance  Topic Date Due  . Flu Shot  07/21/2018  . Tetanus Vaccine  09/20/2021  . Pneumonia vaccines  Completed    Keep follow up with Dr Laurance Flatten and other specialist Stay active and try not to fall

## 2018-05-05 ENCOUNTER — Other Ambulatory Visit: Payer: Self-pay | Admitting: Family Medicine

## 2018-07-19 DIAGNOSIS — H35372 Puckering of macula, left eye: Secondary | ICD-10-CM | POA: Diagnosis not present

## 2018-07-19 DIAGNOSIS — H43813 Vitreous degeneration, bilateral: Secondary | ICD-10-CM | POA: Diagnosis not present

## 2018-07-19 DIAGNOSIS — H26491 Other secondary cataract, right eye: Secondary | ICD-10-CM | POA: Diagnosis not present

## 2018-07-19 DIAGNOSIS — Z961 Presence of intraocular lens: Secondary | ICD-10-CM | POA: Diagnosis not present

## 2018-07-27 ENCOUNTER — Encounter: Payer: Self-pay | Admitting: Family Medicine

## 2018-07-27 ENCOUNTER — Ambulatory Visit (INDEPENDENT_AMBULATORY_CARE_PROVIDER_SITE_OTHER): Payer: Medicare Other | Admitting: Family Medicine

## 2018-07-27 VITALS — BP 129/76 | HR 74 | Temp 97.1°F | Ht 74.0 in | Wt 220.0 lb

## 2018-07-27 DIAGNOSIS — E559 Vitamin D deficiency, unspecified: Secondary | ICD-10-CM | POA: Diagnosis not present

## 2018-07-27 DIAGNOSIS — E7800 Pure hypercholesterolemia, unspecified: Secondary | ICD-10-CM

## 2018-07-27 DIAGNOSIS — K219 Gastro-esophageal reflux disease without esophagitis: Secondary | ICD-10-CM | POA: Diagnosis not present

## 2018-07-27 DIAGNOSIS — C61 Malignant neoplasm of prostate: Secondary | ICD-10-CM | POA: Diagnosis not present

## 2018-07-27 DIAGNOSIS — I10 Essential (primary) hypertension: Secondary | ICD-10-CM

## 2018-07-27 DIAGNOSIS — H9193 Unspecified hearing loss, bilateral: Secondary | ICD-10-CM | POA: Diagnosis not present

## 2018-07-27 DIAGNOSIS — H6123 Impacted cerumen, bilateral: Secondary | ICD-10-CM | POA: Diagnosis not present

## 2018-07-27 MED ORDER — CHOLECALCIFEROL 25 MCG (1000 UT) PO TABS: 1000.0000 [IU] | ORAL_TABLET | Freq: Every day | ORAL | 2 refills | Status: DC

## 2018-07-27 NOTE — Patient Instructions (Addendum)
Medicare Annual Wellness Visit  [facility] and the medical providers at [facility] strive to bring you the best medical care.  In doing so we not only want to address your current medical conditions and concerns but also to detect new conditions early and prevent illness, disease and health-related problems.    Medicare offers a yearly Wellness Visit which allows our clinical staff to assess your need for preventative services including immunizations, lifestyle education, counseling to decrease risk of preventable diseases and screening for fall risk and other medical concerns.    This visit is provided free of charge (no copay) for all Medicare recipients. The clinical pharmacists at [facility] have begun to conduct these Wellness Visits which will also include a thorough review of all your medications.    As you primary medical provider recommend that you make an appointment for your Annual Wellness Visit if you have not done so already this year.  You may set up this appointment before you leave today or you may call back ([phone]) and schedule an appointment.  Please make sure when you call that you mention that you are scheduling your Annual Wellness Visit with the clinical pharmacist so that the appointment may be made for the proper length of time.     Continue current medications. Continue good therapeutic lifestyle changes which include good diet and exercise. Fall precautions discussed with patient. If an FOBT was given today- please return it to our front desk. If you are over 21 years old - you may need Prevnar 70 or the adult Pneumonia vaccine.  **Flu shots are available--- please call and schedule a FLU-CLINIC appointment**  After your visit with [place] today you will receive a survey in the mail or online from [employer] regarding your care with [place]. Please take a moment to fill this out. Your feedback is very  important to [place] as you can help [place] better understand your patient needs as well as improve your experience and satisfaction. WE CARE ABOUT YOU!!!   If, you decide to go back and see an ear nose and throat specialist, please let us know and we will do a referral to Dr. Vicie Mutters the ear specialist in [place] and let them recheck your hearing aids and make sure there is nothing else that can be done to improve your hearing Continue to try to walk and exercise as much as possible Put support hose on the first thing with arising in the morning always try to drink plenty of water and stay well-hydrated

## 2018-07-27 NOTE — Progress Notes (Signed)
Subjective:    Patient ID: Myrtie Neither, male    DOB: [birth year], 84 y.o.   MRN: [id number]  HPI Pt here for follow up and management of chronic medical problems which includes hypertension and hyperlipidemia. He is taking medication regularly.  Florian Buff is doing fairly well other than some swelling in his right leg.  Is requesting a refill on his vitamin D.  His vital signs are stable and his weight is stable compared to the last visit in May.  He will get lab work today and will be given an FOBT to return.  He currently is taking a probiotic taking his vitamin D daily and is on lovastatin daily.  He continues to be followed for his prostate cancer by the urologist only if needed now.  The patient today denies any chest pain pressure tightness or shortness of breath.  He denies any trouble with swallowing heartburn indigestion nausea vomiting diarrhea blood in the stool or change in bowel habits.  He is passing his water well.  He does have some edema and this is always worse during the end of the day and the right side seems to be worse than the left.  His weight is stable.  He does have some support hose but is not wearing these and will start wearing them again and apply these the first thing with getting up out of the bed in the morning.  He and his wife will celebrate their 78th wedding anniversary in November 18 of this year.  They have some regrets about having to leave their home which is been sold here in [place] but they are happy and thankful that they can still be together at the local assisted living facility.  He does say that his worst problem is his hearing.    Patient Active Problem List   Diagnosis Date Noted  . 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 Heart block 12/18/2016  . Second degree AV block, Mobitz type I 12/17/2016  . Abdominal pain, epigastric 12/17/2016  . Mobitz type 1 second degree atrioventricular block   . Hyperlipemia 08/03/2013  . Depression 08/03/2013    . Vitamin D deficiency 03/07/2011  . GERD (gastroesophageal reflux disease) 03/07/2011  . Prostate cancer ([facility]) 01/19/2011  . Essential hypertension 01/19/2011  . Right bundle branch block 01/19/2011  . INTERNAL HEMORRHOIDS 01/19/2011  . DIVERTICULOSIS, COLON 01/19/2011  . COLONIC POLYPS, HX OF 01/19/2011   Outpatient Encounter Medications as of 07/27/2018  Medication Sig  . Cholecalciferol (VITAMIN D-1000 MAX ST) 1000 units tablet Take 1 capsule by mouth daily.  Marland Kitchen lovastatin (MEVACOR) 20 MG tablet TAKE 1 TABLET DAILY  . Probiotic Product (PROBIOTIC DAILY) CAPS Take 1 capsule by mouth daily.   No facility-administered encounter medications on file as of 07/27/2018.       Review of Systems  Constitutional: Negative.   HENT: Negative.   Eyes: Negative.   Respiratory: Negative.   Cardiovascular: Positive for leg swelling (right leg ).  Gastrointestinal: Negative.   Endocrine: Negative.   Genitourinary: Negative.   Musculoskeletal: Negative.   Skin: Negative.   Allergic/Immunologic: Negative.   Neurological: Negative.   Hematological: Negative.   Psychiatric/Behavioral: Negative.        Objective:   Physical Exam  Constitutional: He is oriented to person, place, and time. He appears well-developed and well-nourished. No distress.  The patient remains calm and alert and pleasant and despite his age of 84 is very with it.  HENT:  Head: Normocephalic  and atraumatic.  Nose: Nose normal.  Mouth/Throat: Oropharynx is clear and moist. No oropharyngeal exudate.  Excessive ear cerumen bilaterally  Eyes: Pupils are equal, round, and reactive to light. Conjunctivae and EOM are normal. Right eye exhibits no discharge. Left eye exhibits no discharge. No scleral icterus.  Neck: Normal range of motion. Neck supple. No thyromegaly present.  No bruits thyromegaly or anterior cervical adenopathy  Cardiovascular: Normal rate, regular rhythm and normal heart sounds.  No murmur heard. Heart  is regular at 72/min distal pulses were difficult to palpate.  Pulmonary/Chest: Effort normal and breath sounds normal. He has no wheezes. He exhibits no tenderness.  Clear anteriorly and posteriorly and no axillary adenopathy or chest wall masses.  Abdominal: Soft. Bowel sounds are normal. He exhibits no mass. There is no tenderness.  Genitourinary:  Genitourinary Comments: The patient has an agreement with the urologist that he does not have to come back for any further visits for the prostate cancer.  He was told that if he needed to come back he would be glad to see him any time.  Musculoskeletal: Normal range of motion. He exhibits edema. He exhibits no deformity.  There is some pretibial edema right greater than left and especially at the sock line.  The patient notes that this edema is less in the morning and accumulates more during the day.  He will start wearing his support hose more regularly.  Lymphadenopathy:    He has no cervical adenopathy.  Neurological: He is alert and oriented to person, place, and time. He has normal reflexes. No cranial nerve deficit.  The patient is alert and well oriented.  Skin: Skin is warm and dry. No rash noted.  Psychiatric: He has a normal mood and affect. His behavior is normal. Judgment and thought content normal.  The patient's mood affect and judgment are all normal.  Nursing note and vitals reviewed.  BP 129/76 (BP Location: Left Arm)   Pulse 74   Temp (!) 97.1 F (36.2 C) (Oral)   Ht _0  (1.88 m)   Wt 220 lb (99.8 kg)   BMI 28.25 kg/m         Assessment & Plan:  1. Gastroesophageal reflux disease, esophagitis presence not specified -No complaints with this today. - CBC with Differential/Platelet - Hepatic function panel  2. Pure hypercholesterolemia -Continue to exercise as much as possible and continue with current dose of lovastatin - CBC with Differential/Platelet - Lipid panel  3. Essential hypertension -Continue to  watch sodium intake and drink plenty of water - BMP8+EGFR - CBC with Differential/Platelet - Hepatic function panel  4. Vitamin D deficiency -Continue vitamin D replacement pending results of lab work - CBC with Differential/Platelet - VITAMIN D 25 Hydroxy (Vit-D Deficiency, Fractures)  5. Prostate cancer [facility]) -Patient has prostate cancer but is voiding well and has no further appointments scheduled with the urologist. - CBC with Differential/Platelet  6. Excessive cerumen in ear canal, bilateral -Ear irrigation today to remove cerumen bilaterally because of hearing impairment and wearing hearing aids.  7. Decreased hearing of both ears -Consider going back to ear specialist for further evaluation due to worsening hearing.  Patient will let us know if he needs an appointment  Meds ordered this encounter  Medications  . Cholecalciferol (VITAMIN D-1000 MAX ST) 1000 units tablet    Sig: Take 1 tablet (1,000 Units total) by mouth daily.    Dispense:  12 tablet    Refill:  2   Patient  Instructions                       Medicare Annual Wellness Visit  [facility] and the medical providers at [facility] strive to bring you the best medical care.  In doing so we not only want to address your current medical conditions and concerns but also to detect new conditions early and prevent illness, disease and health-related problems.    Medicare offers a yearly Wellness Visit which allows our clinical staff to assess your need for preventative services including immunizations, lifestyle education, counseling to decrease risk of preventable diseases and screening for fall risk and other medical concerns.    This visit is provided free of charge (no copay) for all Medicare recipients. The clinical pharmacists at [facility] have begun to conduct these Wellness Visits which will also include a thorough review of all your medications.    As you primary  medical provider recommend that you make an appointment for your Annual Wellness Visit if you have not done so already this year.  You may set up this appointment before you leave today or you may call back ([phone]) and schedule an appointment.  Please make sure when you call that you mention that you are scheduling your Annual Wellness Visit with the clinical pharmacist so that the appointment may be made for the proper length of time.     Continue current medications. Continue good therapeutic lifestyle changes which include good diet and exercise. Fall precautions discussed with patient. If an FOBT was given today- please return it to our front desk. If you are over 72 years old - you may need Prevnar 36 or the adult Pneumonia vaccine.  **Flu shots are available--- please call and schedule a FLU-CLINIC appointment**  After your visit with [place] today you will receive a survey in the mail or online from [employer] regarding your care with [place]. Please take a moment to fill this out. Your feedback is very important to [place] as you can help [place] better understand your patient needs as well as improve your experience and satisfaction. WE CARE ABOUT YOU!!!   If, you decide to go back and see an ear nose and throat specialist, please let us know and we will do a referral to Dr. Vicie Mutters the ear specialist in [place] and let them recheck your hearing aids and make sure there is nothing else that can be done to improve your hearing Continue to try to walk and exercise as much as possible Put support hose on the first thing with arising in the morning always try to drink plenty of water and stay well-hydrated   Arrie Senate MD

## 2018-07-28 LAB — LIPID PANEL
CHOLESTEROL TOTAL: 130 mg/dL (ref 100–199)
Chol/HDL Ratio: 3.6 ratio (ref 0.0–5.0)
HDL: 36 mg/dL — AB (ref >39)
LDL Calculated: 68 mg/dL (ref 0–99)
Triglycerides: 131 mg/dL (ref 0–149)
VLDL CHOLESTEROL CAL: 26 mg/dL (ref 5–40)

## 2018-07-28 LAB — BMP8+EGFR
BUN/Creatinine Ratio: 13 (ref 10–24)
BUN: 14 mg/dL (ref 10–36)
CALCIUM: 9.3 mg/dL (ref 8.6–10.2)
CHLORIDE: 102 mmol/L (ref 96–106)
CO2: 25 mmol/L (ref 20–29)
Creatinine, Ser: 1.09 mg/dL (ref 0.76–1.27)
GFR calc Af Amer: 67 mL/min/{1.73_m2} (ref >59)
GFR, EST NON AFRICAN AMERICAN: 58 mL/min/{1.73_m2} — AB (ref >59)
GLUCOSE: 99 mg/dL (ref 65–99)
POTASSIUM: 4.8 mmol/L (ref 3.5–5.2)
Sodium: 139 mmol/L (ref 134–144)

## 2018-07-28 LAB — CBC WITH DIFFERENTIAL/PLATELET
BASOS ABS: 0 10*3/uL (ref 0.0–0.2)
BASOS: 1 %
EOS (ABSOLUTE): 0.2 10*3/uL (ref 0.0–0.4)
Eos: 2 %
Hematocrit: 42 % (ref 37.5–51.0)
Hemoglobin: 14.2 g/dL (ref 13.0–17.7)
IMMATURE GRANULOCYTES: 0 %
Immature Grans (Abs): 0 10*3/uL (ref 0.0–0.1)
Lymphocytes Absolute: 1.8 10*3/uL (ref 0.7–3.1)
Lymphs: 23 %
MCH: 31.7 pg (ref 26.6–33.0)
MCHC: 33.8 g/dL (ref 31.5–35.7)
MCV: 94 fL (ref 79–97)
Monocytes Absolute: 1 10*3/uL — ABNORMAL HIGH (ref 0.1–0.9)
Monocytes: 12 %
NEUTROS PCT: 62 %
Neutrophils Absolute: 5 10*3/uL (ref 1.4–7.0)
PLATELETS: 263 10*3/uL (ref 150–450)
RBC: 4.48 x10E6/uL (ref 4.14–5.80)
RDW: 13.8 % (ref 12.3–15.4)
WBC: 8.1 10*3/uL (ref 3.4–10.8)

## 2018-07-28 LAB — HEPATIC FUNCTION PANEL
ALK PHOS: 85 IU/L (ref 39–117)
ALT: 11 IU/L (ref 0–44)
AST: 18 IU/L (ref 0–40)
Albumin: 3.9 g/dL (ref 3.2–4.6)
BILIRUBIN TOTAL: 0.3 mg/dL (ref 0.0–1.2)
BILIRUBIN, DIRECT: 0.12 mg/dL (ref 0.00–0.40)
Total Protein: 6.7 g/dL (ref 6.0–8.5)

## 2018-07-28 LAB — VITAMIN D 25 HYDROXY (VIT D DEFICIENCY, FRACTURES): VIT D 25 HYDROXY: 46.1 ng/mL (ref 30.0–100.0)

## 2018-10-03 DIAGNOSIS — Z23 Encounter for immunization: Secondary | ICD-10-CM | POA: Diagnosis not present

## 2018-11-10 ENCOUNTER — Other Ambulatory Visit: Payer: Self-pay | Admitting: Family Medicine

## 2018-11-27 IMAGING — DX DG LUMBAR SPINE 2-3V
2 series · 2 of 2 positions shown · non-contrast
Comparison: Chest x-ray 01/17/2017, 05/22/2016.

CLINICAL DATA: Fall.

EXAM:
LUMBAR SPINE - 2-3 VIEW

[l-spine ap]
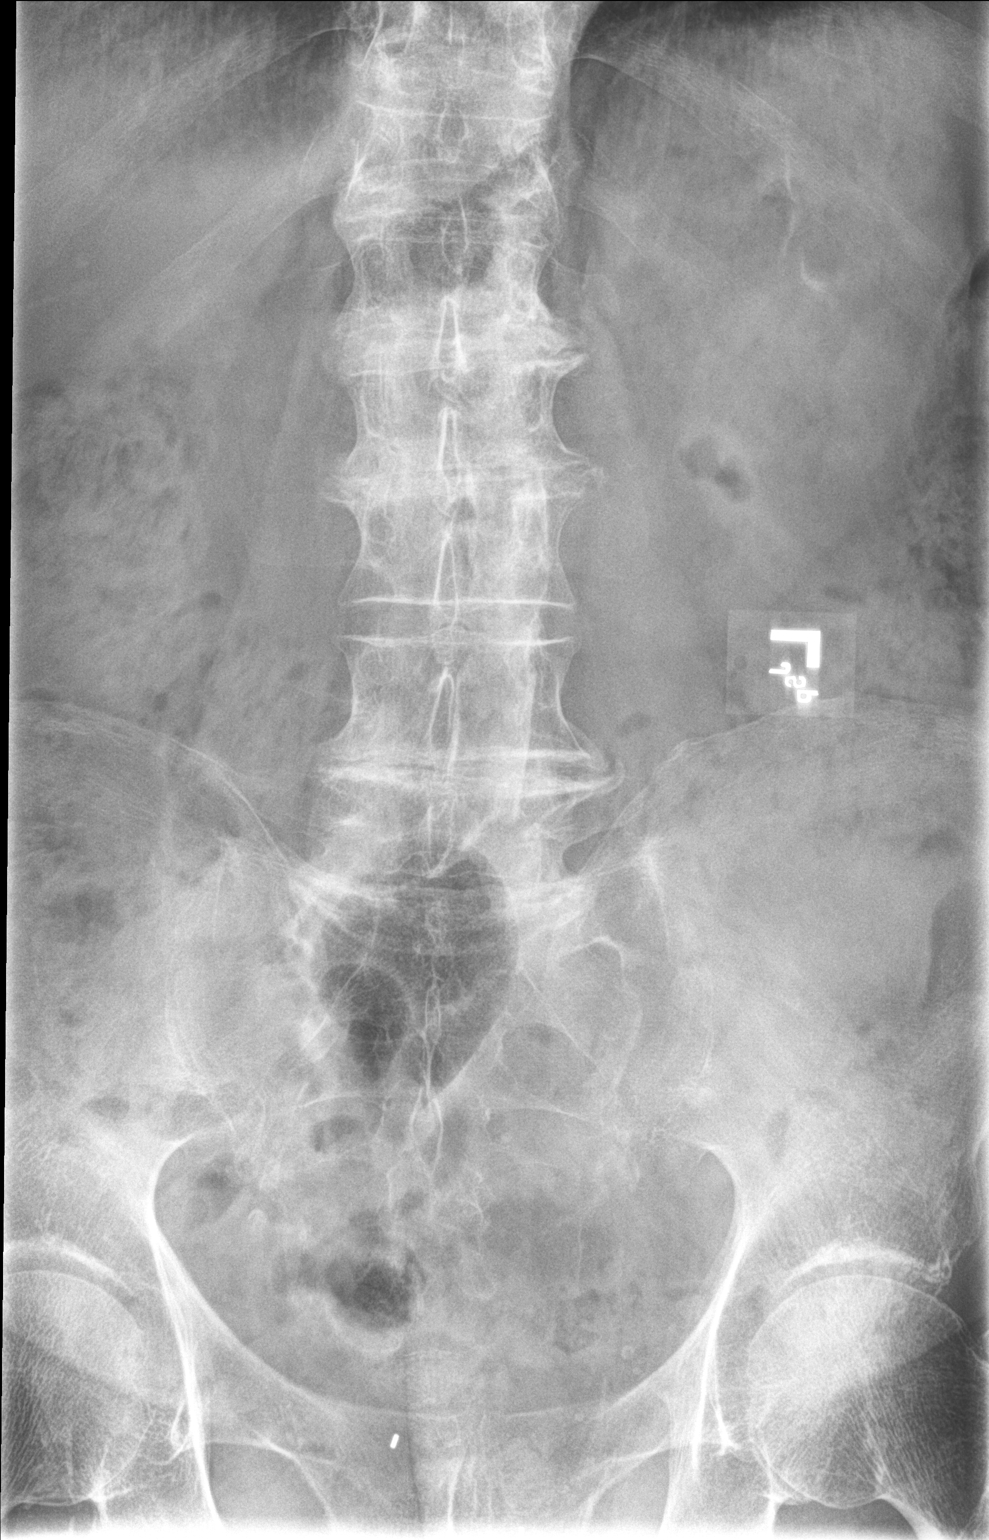

[l-spine lat]
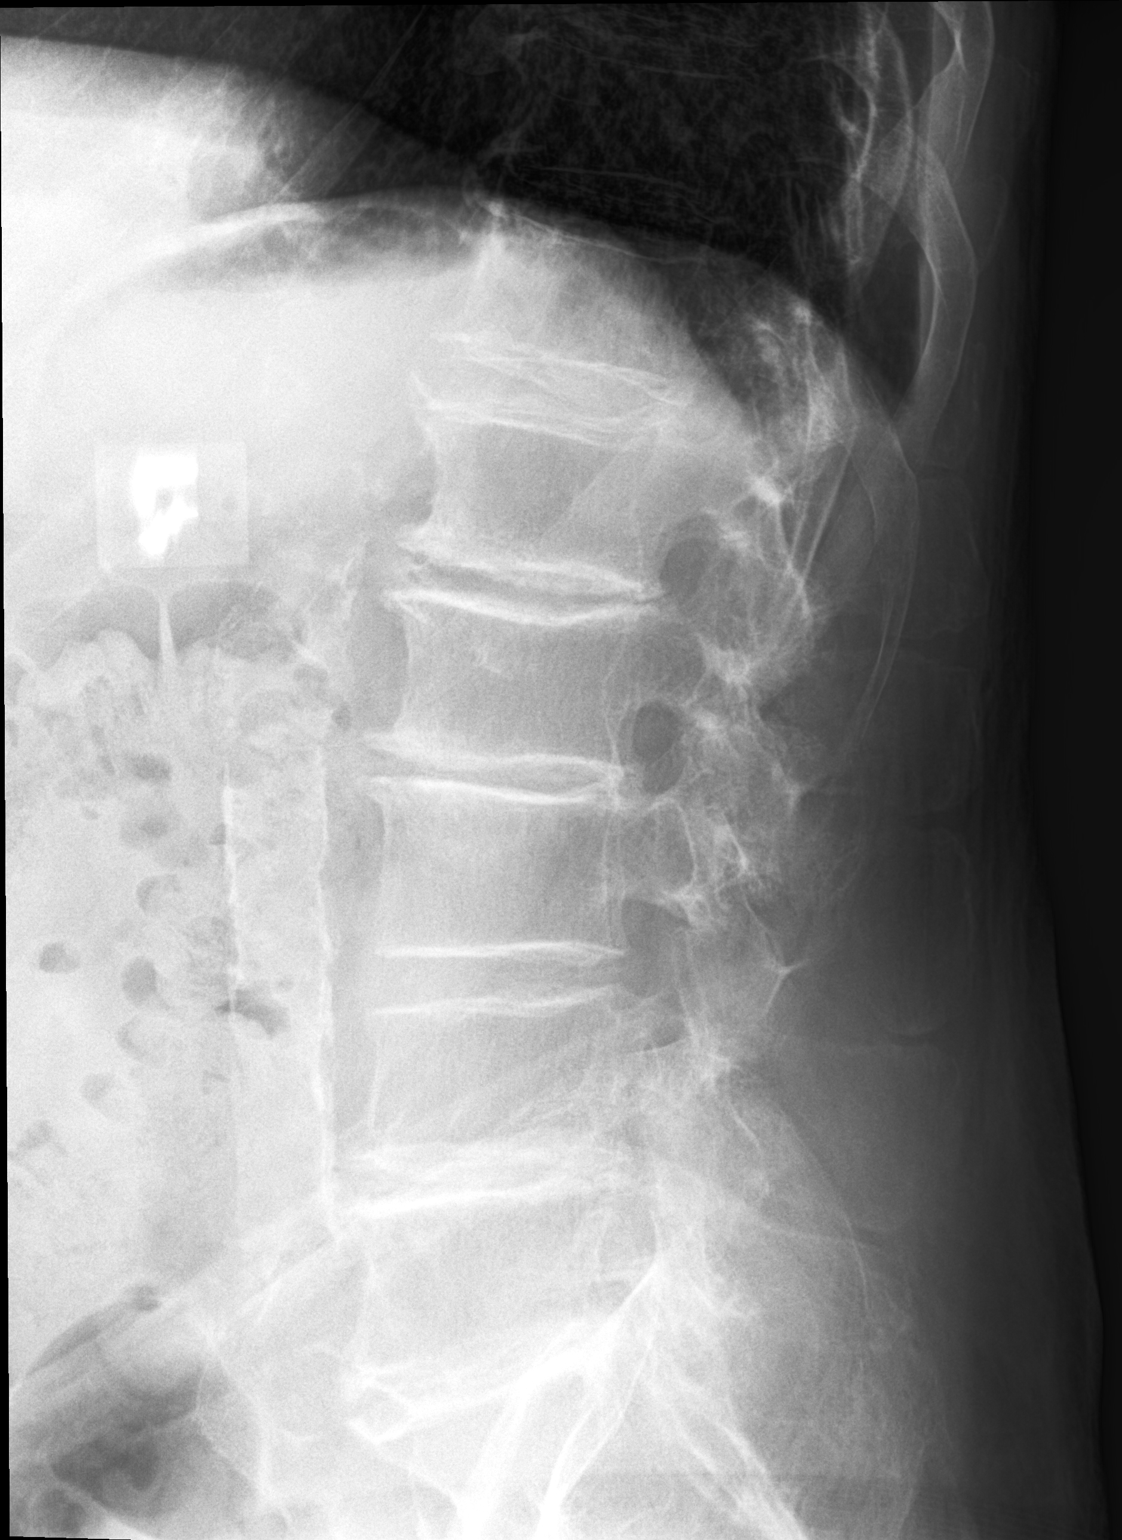

[2 of 2 positions shown; findings below may reference images not displayed]

FINDINGS: Mild lumbar spine scoliosis. Diffuse multilevel degenerative change.
Ankylosing spondylitis cannot be excluded. Stable mild T12
compression fracture. No acute abnormality . Aortoiliac
atherosclerotic vascular calcification. Pelvic clips noted.
IMPRESSION: 1. Mild lumbar scoliosis. Diffuse multilevel degenerative change.
Ankylosis spondylitis cannot be excluded. Stable mild T12
compression. No acute abnormality.

2. Aortoiliac atherosclerotic vascular disease.

## 2018-12-01 ENCOUNTER — Ambulatory Visit (INDEPENDENT_AMBULATORY_CARE_PROVIDER_SITE_OTHER): Payer: Medicare Other | Admitting: Family Medicine

## 2018-12-01 ENCOUNTER — Encounter: Payer: Self-pay | Admitting: Family Medicine

## 2018-12-01 VITALS — BP 119/72 | HR 78 | Temp 97.2°F | Ht 74.0 in | Wt 224.0 lb

## 2018-12-01 DIAGNOSIS — R200 Anesthesia of skin: Secondary | ICD-10-CM

## 2018-12-01 DIAGNOSIS — I10 Essential (primary) hypertension: Secondary | ICD-10-CM

## 2018-12-01 DIAGNOSIS — C61 Malignant neoplasm of prostate: Secondary | ICD-10-CM | POA: Diagnosis not present

## 2018-12-01 DIAGNOSIS — E559 Vitamin D deficiency, unspecified: Secondary | ICD-10-CM | POA: Diagnosis not present

## 2018-12-01 DIAGNOSIS — E7800 Pure hypercholesterolemia, unspecified: Secondary | ICD-10-CM

## 2018-12-01 DIAGNOSIS — K219 Gastro-esophageal reflux disease without esophagitis: Secondary | ICD-10-CM | POA: Diagnosis not present

## 2018-12-01 DIAGNOSIS — H6122 Impacted cerumen, left ear: Secondary | ICD-10-CM

## 2018-12-01 NOTE — Progress Notes (Signed)
Subjective:    Patient ID: Alec Davis, male    DOB: 04/07/1923, 84 y.o.   MRN: [id number]  HPI Pt here for follow up and management of chronic medical problems which includes hypertension and hyperlipidemia. He is taking medication regularly.  Patient today complains of numbness in his right ring finger and cramps in his hands.  He will have lab work done today and will be given an FOBT to return at his convenience.  His vital signs are stable his weight is up 4 pounds.  He is currently taking lovastatin vitamin D3 and a probiotic.  Patient has prostate cancer.  He and his wife are currently living in a residence at [place].  They have downsized from their own home to a private residence there.  He does have cataracts reflux hyperlipidemia and hypertension.  Patient has 1 brother who has Alzheimer's disease and another one that committed suicide.  Patient is currently taking vitamin D3 and lovastatin.  He is not followed by anyone anymore for the prostate cancer.  The patient denies any chest pain pressure tightness or shortness of breath.  He has been having some numbness in his right ring finger but not the fifth finger.  We encouraged him to lay his arm on a pillow and not on a hard surface at nighttime if possible since he sleeps on his back.  He denies any trouble with his stomach including nausea vomiting diarrhea blood in the stool or black tarry bowel movements and is passing his water without problems but just needs better control and it is slow.   Patient Active Problem List   Diagnosis Date Noted  . 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 Heart block 12/18/2016  . Second degree AV block, Mobitz type I 12/17/2016  . Abdominal pain, epigastric 12/17/2016  . Mobitz type 1 second degree atrioventricular block   . Hyperlipemia 08/03/2013  . Depression 08/03/2013  . Vitamin D deficiency 03/07/2011  . GERD (gastroesophageal reflux disease) 03/07/2011  . Prostate cancer  ([place]) 01/19/2011  . Essential hypertension 01/19/2011  . Right bundle branch block 01/19/2011  . INTERNAL HEMORRHOIDS 01/19/2011  . DIVERTICULOSIS, COLON 01/19/2011  . COLONIC POLYPS, HX OF 01/19/2011   Outpatient Encounter Medications as of 12/01/2018  Medication Sig  . Cholecalciferol (VITAMIN D-1000 MAX ST) 1000 units tablet Take 1 tablet (1,000 Units total) by mouth daily.  Marland Kitchen lovastatin (MEVACOR) 20 MG tablet TAKE 1 TABLET DAILY  . Probiotic Product (PROBIOTIC DAILY) CAPS Take 1 capsule by mouth daily.   No facility-administered encounter medications on file as of 12/01/2018.       Review of Systems  Constitutional: Negative.   HENT: Negative.   Eyes: Negative.   Respiratory: Negative.   Cardiovascular: Negative.   Gastrointestinal: Negative.   Endocrine: Negative.   Genitourinary: Negative.   Musculoskeletal: Negative.   Skin: Negative.   Allergic/Immunologic: Negative.   Neurological: Positive for numbness (right ring finger).       Hand cramping  Hematological: Negative.   Psychiatric/Behavioral: Negative.        Objective:   Physical Exam Vitals signs and nursing note reviewed.  Constitutional:      Appearance: Normal appearance. He is well-developed. He is obese.     Comments: Patient is pleasant calm and relaxed.  Wife says he is forgetting more than in the past.  HENT:     Head: Normocephalic and atraumatic.     Right Ear: Tympanic membrane, ear canal and external ear  normal.     Left Ear: There is impacted cerumen.     Ears:     Comments: Cerumen left ear canal    Nose: Nose normal.     Mouth/Throat:     Mouth: Mucous membranes are moist.     Pharynx: Oropharynx is clear. No oropharyngeal exudate.  Eyes:     General: No scleral icterus.       Right eye: No discharge.        Left eye: No discharge.     Conjunctiva/sclera: Conjunctivae normal.     Pupils: Pupils are equal, round, and reactive to light.  Neck:     Musculoskeletal: Normal range of  motion and neck supple.     Thyroid: No thyromegaly.     Trachea: No tracheal deviation.     Comments: Good range of motion and no anterior cervical adenopathy or thyroid enlargement Cardiovascular:     Rate and Rhythm: Normal rate and regular rhythm.     Heart sounds: Normal heart sounds. No murmur.  Pulmonary:     Effort: Pulmonary effort is normal.     Breath sounds: Normal breath sounds. No wheezing or rales.     Comments: Good breath sounds anteriorly and posteriorly Abdominal:     General: Bowel sounds are normal.     Palpations: Abdomen is soft. There is no mass.     Tenderness: There is no abdominal tenderness.     Comments: No masses tenderness or organ enlargement or bruits  Musculoskeletal: Normal range of motion.        General: No tenderness.     Right lower leg: Edema present.     Left lower leg: Edema present.  Lymphadenopathy:     Cervical: No cervical adenopathy.  Skin:    General: Skin is warm and dry.     Findings: No rash.  Neurological:     Mental Status: He is alert and oriented to person, place, and time. Mental status is at baseline.     Cranial Nerves: No cranial nerve deficit.     Deep Tendon Reflexes: Reflexes are normal and symmetric.  Psychiatric:        Mood and Affect: Mood normal.        Behavior: Behavior normal.        Thought Content: Thought content normal.        Judgment: Judgment normal.     Comments: The patient's mood affect and behavior are normal for him today.    BP 119/72 (BP Location: Left Arm)   Pulse 78   Temp (!) 97.2 F (36.2 C) (Oral)   Ht _0  (1.88 m)   Wt 224 lb (101.6 kg)   BMI 28.76 kg/m         Assessment & Plan:  1. Essential hypertension -Blood pressure is good today and he will continue with current treatment - CBC with Differential/Platelet - BMP8+EGFR - Hepatic function panel  2. Pure hypercholesterolemia -Continue with current treatment and as aggressive therapeutic lifestyle changes as  possible - CBC with Differential/Platelet - Lipid panel  3. Gastroesophageal reflux disease, esophagitis presence not specified -No complaints today with reflux. - CBC with Differential/Platelet  4. Vitamin D deficiency -Continue with vitamin D replacement pending results of lab work - CBC with Differential/Platelet - VITAMIN D 25 Hydroxy (Vit-D Deficiency, Fractures)  5. Prostate cancer [facility]) -Patient no longer gets rectal exams with any aggressive management of his prostate cancer. - CBC with Differential/Platelet  6. Finger  numbness -Encouraged patient to avoid sleeping with his elbow on a firm surface but elevate the right arm on a pillow at nighttime.  7. Excessive cerumen in left ear canal -Ear irrigation to remove cerumen  Patient Instructions                       Medicare Annual Wellness Visit  [facility] and the medical providers at [facility] strive to bring you the best medical care.  In doing so we not only want to address your current medical conditions and concerns but also to detect new conditions early and prevent illness, disease and health-related problems.    Medicare offers a yearly Wellness Visit which allows our clinical staff to assess your need for preventative services including immunizations, lifestyle education, counseling to decrease risk of preventable diseases and screening for fall risk and other medical concerns.    This visit is provided free of charge (no copay) for all Medicare recipients. The clinical pharmacists at [facility] have begun to conduct these Wellness Visits which will also include a thorough review of all your medications.    As you primary medical provider recommend that you make an appointment for your Annual Wellness Visit if you have not done so already this year.  You may set up this appointment before you leave today or you may call back ([phone]) and schedule an appointment.   Please make sure when you call that you mention that you are scheduling your Annual Wellness Visit with the clinical pharmacist so that the appointment may be made for the proper length of time.     Continue current medications. Continue good therapeutic lifestyle changes which include good diet and exercise. Fall precautions discussed with patient. If an FOBT was given today- please return it to our front desk. If you are over 26 years old - you may need Prevnar 68 or the adult Pneumonia vaccine.  **Flu shots are available--- please call and schedule a FLU-CLINIC appointment**  After your visit with [place] today you will receive a survey in the mail or online from [employer] regarding your care with [place]. Please take a moment to fill this out. Your feedback is very important to [place] as you can help [place] better understand your patient needs as well as improve your experience and satisfaction. WE CARE ABOUT YOU!!!  Continue to drink plenty of water and fluids Walk and exercise without falling as much as possible Encourage wife to drink water also   Arrie Senate MD

## 2018-12-01 NOTE — Patient Instructions (Addendum)
Medicare Annual Wellness Visit  [facility] and the medical providers at [facility] strive to bring you the best medical care.  In doing so we not only want to address your current medical conditions and concerns but also to detect new conditions early and prevent illness, disease and health-related problems.    Medicare offers a yearly Wellness Visit which allows our clinical staff to assess your need for preventative services including immunizations, lifestyle education, counseling to decrease risk of preventable diseases and screening for fall risk and other medical concerns.    This visit is provided free of charge (no copay) for all Medicare recipients. The clinical pharmacists at [facility] have begun to conduct these Wellness Visits which will also include a thorough review of all your medications.    As you primary medical provider recommend that you make an appointment for your Annual Wellness Visit if you have not done so already this year.  You may set up this appointment before you leave today or you may call back ([phone]) and schedule an appointment.  Please make sure when you call that you mention that you are scheduling your Annual Wellness Visit with the clinical pharmacist so that the appointment may be made for the proper length of time.     Continue current medications. Continue good therapeutic lifestyle changes which include good diet and exercise. Fall precautions discussed with patient. If an FOBT was given today- please return it to our front desk. If you are over 3 years old - you may need Prevnar 34 or the adult Pneumonia vaccine.  **Flu shots are available--- please call and schedule a FLU-CLINIC appointment**  After your visit with [place] today you will receive a survey in the mail or online from [employer] regarding your care with [place]. Please take a moment to fill this out. Your feedback is very  important to [place] as you can help [place] better understand your patient needs as well as improve your experience and satisfaction. WE CARE ABOUT YOU!!!  Continue to drink plenty of water and fluids Walk and exercise without falling as much as possible Encourage wife to drink water also

## 2018-12-02 LAB — CBC WITH DIFFERENTIAL/PLATELET
BASOS: 1 %
Basophils Absolute: 0.1 10*3/uL (ref 0.0–0.2)
EOS (ABSOLUTE): 0.2 10*3/uL (ref 0.0–0.4)
Eos: 2 %
Hematocrit: 41.9 % (ref 37.5–51.0)
Hemoglobin: 13.9 g/dL (ref 13.0–17.7)
IMMATURE GRANULOCYTES: 0 %
Immature Grans (Abs): 0 10*3/uL (ref 0.0–0.1)
Lymphocytes Absolute: 1.7 10*3/uL (ref 0.7–3.1)
Lymphs: 20 %
MCH: 30.8 pg (ref 26.6–33.0)
MCHC: 33.2 g/dL (ref 31.5–35.7)
MCV: 93 fL (ref 79–97)
MONOS ABS: 0.9 10*3/uL (ref 0.1–0.9)
Monocytes: 11 %
NEUTROS PCT: 66 %
Neutrophils Absolute: 5.7 10*3/uL (ref 1.4–7.0)
PLATELETS: 269 10*3/uL (ref 150–450)
RBC: 4.51 x10E6/uL (ref 4.14–5.80)
RDW: 13 % (ref 12.3–15.4)
WBC: 8.6 10*3/uL (ref 3.4–10.8)

## 2018-12-02 LAB — BMP8+EGFR
BUN/Creatinine Ratio: 8 — ABNORMAL LOW (ref 10–24)
BUN: 8 mg/dL — ABNORMAL LOW (ref 10–36)
CALCIUM: 9.1 mg/dL (ref 8.6–10.2)
CHLORIDE: 101 mmol/L (ref 96–106)
CO2: 24 mmol/L (ref 20–29)
Creatinine, Ser: 1.01 mg/dL (ref 0.76–1.27)
GFR calc non Af Amer: 64 mL/min/{1.73_m2} (ref >59)
GFR, EST AFRICAN AMERICAN: 74 mL/min/{1.73_m2} (ref >59)
Glucose: 110 mg/dL — ABNORMAL HIGH (ref 65–99)
Potassium: 4.4 mmol/L (ref 3.5–5.2)
SODIUM: 141 mmol/L (ref 134–144)

## 2018-12-02 LAB — HEPATIC FUNCTION PANEL
ALBUMIN: 4 g/dL (ref 3.2–4.6)
ALT: 11 IU/L (ref 0–44)
AST: 17 IU/L (ref 0–40)
Alkaline Phosphatase: 94 IU/L (ref 39–117)
BILIRUBIN, DIRECT: 0.11 mg/dL (ref 0.00–0.40)
Bilirubin Total: 0.3 mg/dL (ref 0.0–1.2)
TOTAL PROTEIN: 6.5 g/dL (ref 6.0–8.5)

## 2018-12-02 LAB — VITAMIN D 25 HYDROXY (VIT D DEFICIENCY, FRACTURES): VIT D 25 HYDROXY: 41.3 ng/mL (ref 30.0–100.0)

## 2018-12-02 LAB — LIPID PANEL
Chol/HDL Ratio: 3.2 ratio (ref 0.0–5.0)
Cholesterol, Total: 130 mg/dL (ref 100–199)
HDL: 41 mg/dL (ref >39)
LDL Calculated: 67 mg/dL (ref 0–99)
Triglycerides: 108 mg/dL (ref 0–149)
VLDL Cholesterol Cal: 22 mg/dL (ref 5–40)

## 2019-02-27 ENCOUNTER — Ambulatory Visit (INDEPENDENT_AMBULATORY_CARE_PROVIDER_SITE_OTHER): Payer: Medicare Other | Admitting: Family Medicine

## 2019-02-27 ENCOUNTER — Encounter: Payer: Self-pay | Admitting: Family Medicine

## 2019-02-27 VITALS — BP 110/67 | HR 74 | Temp 96.8°F | Ht 74.0 in | Wt 215.0 lb

## 2019-02-27 DIAGNOSIS — K30 Functional dyspepsia: Secondary | ICD-10-CM

## 2019-02-27 MED ORDER — FAMOTIDINE 20 MG PO TABS: 20.0000 mg | ORAL_TABLET | Freq: Two times a day (BID) | ORAL | 2 refills | Status: DC

## 2019-02-27 NOTE — Progress Notes (Signed)
Subjective:    Patient ID: Myrtie Neither, male    DOB: 11/29/1923, 85 y.o.   MRN: [id number]  HPI Patient here today for indigestion.  The patient in describing this says it is a lot like heartburn and then he says there is pressure.  He did say that Tums relieve this.  When he talks about these episodes of indigestion he says he has had 2 or 3 over several months.  The most recent episode was about mid afternoon and they have taken close to wash them and the laundry there and best when he had the indigestion.  Since that time he has walked to pick up food and done other activities without any "indigestion ".  This is most likely some reflux.  He denies any frank chest pain or shortness of breath or blood in the stool or problems passing his water anymore than usual.  In addition to doing an EKG we will give him a fecal occult blood test card to check and check a CBC.    Patient Active Problem List   Diagnosis Date Noted  . 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 Heart block 12/18/2016  . Second degree AV block, Mobitz type I 12/17/2016  . Abdominal pain, epigastric 12/17/2016  . Mobitz type 1 second degree atrioventricular block   . Hyperlipemia 08/03/2013  . Depression 08/03/2013  . Vitamin D deficiency 03/07/2011  . GERD (gastroesophageal reflux disease) 03/07/2011  . Prostate cancer ([place]) 01/19/2011  . Essential hypertension 01/19/2011  . Right bundle branch block 01/19/2011  . INTERNAL HEMORRHOIDS 01/19/2011  . DIVERTICULOSIS, COLON 01/19/2011  . COLONIC POLYPS, HX OF 01/19/2011   Outpatient Encounter Medications as of 02/27/2019  Medication Sig  . Cholecalciferol (VITAMIN D-1000 MAX ST) 1000 units tablet Take 1 tablet (1,000 Units total) by mouth daily.  Marland Kitchen lovastatin (MEVACOR) 20 MG tablet TAKE 1 TABLET DAILY  . vitamin E 1000 UNIT capsule Take 1,000 Units by mouth daily.   No facility-administered encounter medications on file as of 02/27/2019.      Review of Systems    Constitutional: Negative.   HENT: Negative.   Eyes: Negative.   Respiratory: Negative.   Cardiovascular: Negative.   Gastrointestinal: Negative.        Indigestion   Endocrine: Negative.   Genitourinary: Negative.   Musculoskeletal: Negative.   Skin: Negative.   Allergic/Immunologic: Negative.   Neurological: Negative.   Hematological: Negative.   Psychiatric/Behavioral: Negative.        Objective:   Physical Exam Vitals signs and nursing note reviewed.  Constitutional:      General: He is not in acute distress.    Appearance: Normal appearance. He is well-developed.     Comments: The patient is pleasant and alert.  HENT:     Head: Normocephalic and atraumatic.     Right Ear: External ear normal.     Left Ear: External ear normal.     Nose: Nose normal.     Mouth/Throat:     Pharynx: No oropharyngeal exudate.  Eyes:     General: No scleral icterus.       Right eye: No discharge.        Left eye: No discharge.     Extraocular Movements: Extraocular movements intact.     Conjunctiva/sclera: Conjunctivae normal.     Pupils: Pupils are equal, round, and reactive to light.  Neck:     Musculoskeletal: Normal range of motion and neck supple.     Thyroid:  No thyromegaly.     Trachea: No tracheal deviation.  Cardiovascular:     Rate and Rhythm: Normal rate and regular rhythm.     Heart sounds: Normal heart sounds. No murmur.     Comments: The heart is 72/min and regular.  There is no edema. Pulmonary:     Effort: Pulmonary effort is normal. No respiratory distress.     Breath sounds: Normal breath sounds. No wheezing or rales.     Comments: Lungs are clear anteriorly and posteriorly. Abdominal:     General: Bowel sounds are normal. There is no distension.     Palpations: Abdomen is soft. There is no mass.     Tenderness: There is no abdominal tenderness. There is no guarding.     Comments: No liver or spleen enlargement no masses no tenderness and no suprapubic  tenderness.  Musculoskeletal: Normal range of motion.        General: No tenderness.     Right lower leg: No edema.     Left lower leg: No edema.  Lymphadenopathy:     Cervical: No cervical adenopathy.  Skin:    General: Skin is warm and dry.     Findings: No rash.  Neurological:     Mental Status: He is alert and oriented to person, place, and time. Mental status is at baseline.     Cranial Nerves: No cranial nerve deficit.     Motor: No weakness.     Gait: Gait normal.     Deep Tendon Reflexes: Reflexes are normal and symmetric.  Psychiatric:        Mood and Affect: Mood normal.        Behavior: Behavior normal.        Thought Content: Thought content normal.        Judgment: Judgment normal.     Comments: Mood affect and behavior for this patient appear to be normal.     BP 110/67 (BP Location: Left Arm)   Pulse 74   Temp (!) 96.8 F (36 C) (Oral)   Ht 6\' 2"  (1.88 m)   Wt 215 lb (97.5 kg)   SpO2 96%   BMI 27.60 kg/m        Assessment & Plan:  1. Indigestion -Take Pepcid AC 1 twice daily on a regular basis for the next 4 to 5 weeks before breakfast and supper -If the indigestion episodes get worse get back in touch with [place] -Let us know how he is doing when he comes back with his wife in 4 to 5 weeks for recheck of her pro time - CBC with Differential/Platelet -FOBT - EKG 12-Lead  Patient Instructions  Take Pepcid AC 1 twice daily before breakfast and supper and when the patient is back with his wife in this office in 4 weeks for recheck of her pro time please let us know how the episodes of indigestion are doing at that time Continue to drink plenty of fluids and stay well-hydrated Continue to be careful not to put himself at risk for falling Return the FOBT card  Arrie Senate MD

## 2019-02-27 NOTE — Patient Instructions (Addendum)
Take Pepcid AC 1 twice daily before breakfast and supper and when the patient is back with his wife in this office in 4 weeks for recheck of her pro time please let us know how the episodes of indigestion are doing at that time Continue to drink plenty of fluids and stay well-hydrated Continue to be careful not to put himself at risk for falling Return the FOBT card

## 2019-03-15 ENCOUNTER — Other Ambulatory Visit: Payer: Self-pay | Admitting: Family Medicine

## 2019-04-06 ENCOUNTER — Ambulatory Visit: Payer: Medicare Other | Admitting: Family Medicine

## 2019-06-27 ENCOUNTER — Other Ambulatory Visit: Payer: Self-pay | Admitting: Family Medicine

## 2019-06-27 NOTE — Telephone Encounter (Signed)
rx refill approved pt has f/u appt with Dr Lajuana Ripple 07/04/19.

## 2019-07-04 ENCOUNTER — Ambulatory Visit: Payer: Medicare Other | Admitting: Family Medicine

## 2019-07-13 ENCOUNTER — Ambulatory Visit: Payer: Medicare Other | Admitting: Family Medicine

## 2019-08-02 ENCOUNTER — Telehealth: Payer: Self-pay | Admitting: Internal Medicine

## 2019-08-02 ENCOUNTER — Other Ambulatory Visit: Payer: Self-pay

## 2019-08-02 ENCOUNTER — Emergency Department (HOSPITAL_COMMUNITY): Payer: Medicare Other

## 2019-08-02 ENCOUNTER — Encounter (HOSPITAL_COMMUNITY): Payer: Self-pay | Admitting: Emergency Medicine

## 2019-08-02 ENCOUNTER — Emergency Department (HOSPITAL_COMMUNITY)
Admission: EM | Admit: 2019-08-02 | Discharge: 2019-08-02 | Disposition: A | Discharge status: Home / Self Care | Payer: Medicare Other | Attending: Emergency Medicine | Admitting: Emergency Medicine

## 2019-08-02 DIAGNOSIS — R1084 Generalized abdominal pain: Secondary | ICD-10-CM | POA: Diagnosis not present

## 2019-08-02 DIAGNOSIS — Z8546 Personal history of malignant neoplasm of prostate: Secondary | ICD-10-CM | POA: Insufficient documentation

## 2019-08-02 DIAGNOSIS — Z743 Need for continuous supervision: Secondary | ICD-10-CM | POA: Diagnosis not present

## 2019-08-02 DIAGNOSIS — R279 Unspecified lack of coordination: Secondary | ICD-10-CM | POA: Diagnosis not present

## 2019-08-02 DIAGNOSIS — I440 Atrioventricular block, first degree: Secondary | ICD-10-CM | POA: Diagnosis not present

## 2019-08-02 DIAGNOSIS — I4891 Unspecified atrial fibrillation: Secondary | ICD-10-CM | POA: Diagnosis not present

## 2019-08-02 DIAGNOSIS — E279 Disorder of adrenal gland, unspecified: Secondary | ICD-10-CM | POA: Diagnosis not present

## 2019-08-02 DIAGNOSIS — R109 Unspecified abdominal pain: Secondary | ICD-10-CM | POA: Diagnosis not present

## 2019-08-02 DIAGNOSIS — R52 Pain, unspecified: Secondary | ICD-10-CM | POA: Diagnosis not present

## 2019-08-02 DIAGNOSIS — R7989 Other specified abnormal findings of blood chemistry: Secondary | ICD-10-CM

## 2019-08-02 DIAGNOSIS — Z79899 Other long term (current) drug therapy: Secondary | ICD-10-CM | POA: Insufficient documentation

## 2019-08-02 DIAGNOSIS — R945 Abnormal results of liver function studies: Secondary | ICD-10-CM | POA: Diagnosis not present

## 2019-08-02 DIAGNOSIS — Z87891 Personal history of nicotine dependence: Secondary | ICD-10-CM | POA: Diagnosis not present

## 2019-08-02 DIAGNOSIS — I10 Essential (primary) hypertension: Secondary | ICD-10-CM | POA: Insufficient documentation

## 2019-08-02 DIAGNOSIS — I4510 Unspecified right bundle-branch block: Secondary | ICD-10-CM | POA: Diagnosis not present

## 2019-08-02 DIAGNOSIS — R509 Fever, unspecified: Secondary | ICD-10-CM | POA: Diagnosis not present

## 2019-08-02 DIAGNOSIS — R609 Edema, unspecified: Secondary | ICD-10-CM | POA: Diagnosis not present

## 2019-08-02 LAB — CBC WITH DIFFERENTIAL/PLATELET
Abs Immature Granulocytes: 0.04 10*3/uL (ref 0.00–0.07)
Basophils Absolute: 0 10*3/uL (ref 0.0–0.1)
Basophils Relative: 0 %
Eosinophils Absolute: 0 10*3/uL (ref 0.0–0.5)
Eosinophils Relative: 0 %
HCT: 43.5 % (ref 39.0–52.0)
Hemoglobin: 14.4 g/dL (ref 13.0–17.0)
Immature Granulocytes: 0 %
Lymphocytes Relative: 6 %
Lymphs Abs: 0.6 10*3/uL — ABNORMAL LOW (ref 0.7–4.0)
MCH: 31.2 pg (ref 26.0–34.0)
MCHC: 33.1 g/dL (ref 30.0–36.0)
MCV: 94.2 fL (ref 80.0–100.0)
Monocytes Absolute: 0.6 10*3/uL (ref 0.1–1.0)
Monocytes Relative: 6 %
Neutro Abs: 8.3 10*3/uL — ABNORMAL HIGH (ref 1.7–7.7)
Neutrophils Relative %: 88 %
Platelets: 200 10*3/uL (ref 150–400)
RBC: 4.62 MIL/uL (ref 4.22–5.81)
RDW: 13.2 % (ref 11.5–15.5)
WBC: 9.5 10*3/uL (ref 4.0–10.5)
nRBC: 0 % (ref 0.0–0.2)

## 2019-08-02 LAB — URINALYSIS, ROUTINE W REFLEX MICROSCOPIC
Bacteria, UA: NONE SEEN
Bilirubin Urine: NEGATIVE
Glucose, UA: NEGATIVE mg/dL
Ketones, ur: 5 mg/dL — AB
Leukocytes,Ua: NEGATIVE
Nitrite: NEGATIVE
Protein, ur: 30 mg/dL — AB
Specific Gravity, Urine: 1.018 (ref 1.005–1.030)
pH: 6 (ref 5.0–8.0)

## 2019-08-02 LAB — COMPREHENSIVE METABOLIC PANEL
ALT: 549 U/L — ABNORMAL HIGH (ref 0–44)
AST: 377 U/L — ABNORMAL HIGH (ref 15–41)
Albumin: 3.4 g/dL — ABNORMAL LOW (ref 3.5–5.0)
Alkaline Phosphatase: 96 U/L (ref 38–126)
Anion gap: 9 (ref 5–15)
BUN: 11 mg/dL (ref 8–23)
CO2: 25 mmol/L (ref 22–32)
Calcium: 8.6 mg/dL — ABNORMAL LOW (ref 8.9–10.3)
Chloride: 102 mmol/L (ref 98–111)
Creatinine, Ser: 1.02 mg/dL (ref 0.61–1.24)
GFR calc Af Amer: >60 mL/min (ref >60)
GFR calc non Af Amer: >60 mL/min (ref >60)
Glucose, Bld: 113 mg/dL — ABNORMAL HIGH (ref 70–99)
Potassium: 3.5 mmol/L (ref 3.5–5.1)
Sodium: 136 mmol/L (ref 135–145)
Total Bilirubin: 1.8 mg/dL — ABNORMAL HIGH (ref 0.3–1.2)
Total Protein: 6.9 g/dL (ref 6.5–8.1)

## 2019-08-02 LAB — LIPASE, BLOOD: Lipase: 20 U/L (ref 11–51)

## 2019-08-02 MED ORDER — IOHEXOL 300 MG/ML  SOLN
100.0000 mL | Freq: Once | INTRAMUSCULAR | Status: AC | PRN
  Administered 2019-08-02: 16:00:00 100 mL via INTRAVENOUS

## 2019-08-02 NOTE — ED Provider Notes (Signed)
[facility] EMERGENCY DEPARTMENT Provider Note   CSN: [id number] Arrival date & time: 08/02/19  1222     History   Chief Complaint Chief Complaint  Patient presents with  . Abdominal Pain    HPI Alec Davis is a 85 y.o. male presenting for evaluation of abdominal pain.  Patient states he had abdominal discomfort earlier this morning.  Staff at the facility he lives that called EMS, but he refused transport at that time.  He is here because his family and staff still wanted him to be seen, even though he reports all pain has resolved.  He denies recent fevers, chills, chest pain, shortness breath, nausea, vomiting, urinary symptoms, normal bowel movements.  Per triage note, patient has been found to have urinary incontinence of the possible days, however patient denies any change in his urination.  He states he ate breakfast without pain or difficulty this morning.  No recent medication changes.  No sick contacts.     HPI  Past Medical History:  Diagnosis Date  . BPH (benign prostatic hyperplasia)   . Cataract   . GERD (gastroesophageal reflux disease)   . Hyperlipidemia   . Hypertension   . PROSTATE CANCER 01/19/2011   Qualifier: Diagnosis of  By: Nils Pyle CMA ([place]), [name]    . Vitamin D deficiency     Patient Active Problem List   Diagnosis Date Noted  . 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 Heart block 12/18/2016  . Second degree AV block, Mobitz type I 12/17/2016  . Abdominal pain, epigastric 12/17/2016  . Mobitz type 1 second degree atrioventricular block   . Hyperlipemia 08/03/2013  . Depression 08/03/2013  . Vitamin D deficiency 03/07/2011  . GERD (gastroesophageal reflux disease) 03/07/2011  . Prostate cancer ([place]) 01/19/2011  . Essential hypertension 01/19/2011  . Right bundle branch block 01/19/2011  . INTERNAL HEMORRHOIDS 01/19/2011  . DIVERTICULOSIS, COLON 01/19/2011  . COLONIC POLYPS, HX OF 01/19/2011    Past Surgical History:   Procedure Laterality Date  . cataracts  Bilateral    Dr [name]  . LAPAROTOMY N/A 04/03/2017   Procedure: EXPLORATORY LAPAROTOMY AND RESECTION OF GALLSTONE ILEUS.;  Surgeon: [name], MD;  Location: [facility];  Service: General;  Laterality: N/A;  . SPINE SURGERY     in [place] age 32         Home Medications    Prior to Admission medications   Medication Sig Start Date End Date Taking? Authorizing Provider  Cholecalciferol (VITAMIN D-1000 MAX ST) 1000 units tablet Take 1 tablet (1,000 Units total) by mouth daily. 07/27/18   Chipper Herb, MD  famotidine (PEPCID) 20 MG tablet Take 1 tablet (20 mg total) by mouth 2 (two) times daily. 02/27/19   Chipper Herb, MD  lovastatin (MEVACOR) 20 MG tablet TAKE 1 TABLET DAILY 06/27/19   Ronnie Doss M, DO  vitamin E 1000 UNIT capsule Take 1,000 Units by mouth daily.    [provider]    Family History Family History  Problem Relation Age of Onset  . Suicidality Father   . Alzheimer's disease Brother   . Other Brother        polio  . Arthritis Son        shoulders     Social History Social History   Tobacco Use  . Smoking status: Former Smoker    Quit date: 01/25/1950    Years since quitting: 69.5  . Smokeless tobacco: Never Used  Substance Use Topics  .  Alcohol use: No  . Drug use: No     Allergies   Patient has no known allergies.   Review of Systems Review of Systems  Gastrointestinal: Positive for abdominal pain (resolved).  All other systems reviewed and are negative.    Physical Exam Updated Vital Signs BP (!) 137/92 (BP Location: Right Arm)   Pulse 80   Temp 99.4 F (37.4 C) (Rectal)   Resp 19   Ht 6\' 2"  (1.88 m)   Wt 97.5 kg   SpO2 98%   BMI 27.60 kg/m   Physical Exam Vitals signs and nursing note reviewed.  Constitutional:      General: He is not in acute distress.    Appearance: He is well-developed.     Comments: Elderly male resting comfortably in the bed in no acute distress   HENT:     Head: Normocephalic and atraumatic.  Eyes:     Conjunctiva/sclera: Conjunctivae normal.     Pupils: Pupils are equal, round, and reactive to light.  Neck:     Musculoskeletal: Normal range of motion and neck supple.  Cardiovascular:     Rate and Rhythm: Normal rate and regular rhythm.  Pulmonary:     Effort: Pulmonary effort is normal. No respiratory distress.     Breath sounds: Normal breath sounds. No wheezing.  Abdominal:     General: There is no distension.     Palpations: Abdomen is soft.     Tenderness: There is no abdominal tenderness.     Comments: No tenderness palpation the abdomen.  Soft without rigidity, guarding, distention.  Negative rebound.  No signs of peritonitis.  Genitourinary:    Comments: Pt able to urinate into urinal without difficulty Musculoskeletal: Normal range of motion.     Right lower leg: Edema present.     Left lower leg: Edema present.     Comments: Bilateral lower ext edema, baseline per pt.   Skin:    General: Skin is warm and dry.     Capillary Refill: Capillary refill takes less than 2 seconds.  Neurological:     Mental Status: He is alert and oriented to person, place, and time.      ED Treatments / Results  Labs (all labs ordered are listed, but only abnormal results are displayed) Labs Reviewed  CBC WITH DIFFERENTIAL/PLATELET - Abnormal; Notable for the following components:      Result Value   Neutro Abs 8.3 (*)    Lymphs Abs 0.6 (*)    All other components within normal limits  COMPREHENSIVE METABOLIC PANEL - Abnormal; Notable for the following components:   Glucose, Bld 113 (*)    Calcium 8.6 (*)    Albumin 3.4 (*)    AST 377 (*)    ALT 549 (*)    Total Bilirubin 1.8 (*)    All other components within normal limits  URINALYSIS, ROUTINE W REFLEX MICROSCOPIC - Abnormal; Notable for the following components:   Color, Urine AMBER (*)    Hgb urine dipstick SMALL (*)    Ketones, ur 5 (*)    Protein, ur 30 (*)     All other components within normal limits  URINE CULTURE  LIPASE, BLOOD    EKG EKG Interpretation  Date/Time:  Wednesday August 02 2019 12:39:32 EDT Ventricular Rate:  79 PR Interval:    QRS Duration: 131 QT Interval:  422 QTC Calculation: 484 R Axis:   81 Text Interpretation:  Sinus rhythm Ventricular premature complex Prolonged PR  interval Probable left atrial enlargement Right bundle branch block similar to April 2018 Confirmed by Sherwood Gambler [phone]) on 08/02/2019 12:57:41 PM   Radiology Dg Chest Portable 1 View  Result Date: 08/02/2019 CLINICAL DATA:  Fever EXAM: PORTABLE CHEST 1 VIEW COMPARISON:  02/17/2017 FINDINGS: Cardiomediastinal silhouette is within normal limits. Calcific aortic knob. Calcified granuloma in the right upper lobe. No focal airspace consolidation, pleural effusion, or pneumothorax. IMPRESSION: No acute cardiopulmonary findings. Electronically Signed   By: Davina Poke M.D.   On: 08/02/2019 13:53    Procedures Procedures (including critical care time)  Medications Ordered in ED Medications - No data to display   Initial Impression / Assessment and Plan / ED Course  I have reviewed the triage vital signs and the nursing notes.  Pertinent labs & imaging results that were available during my care of the patient were reviewed by me and considered in my medical decision making (see chart for details).        Patient presenting for evaluation of abdominal pain which has since resolved.  Physical examination, he appears nontoxic.  No tenderness palpation of the abdomen.  Overall, he is fairly well-appearing.  Will obtain basic screening abdominal labs, urine, and reassess.  Likely he will be able to go home.  Urine negative for infection.  Labs show acute elevation in liver enzymes.  As such, will obtain CT to assess for retained stone versus mass.  He had a lap cholecystectomy 2 years ago.  Case discussed with attending, Dr. Regenia Skeeter evaluated the  patient.  CT pending.  Patient signed out to Alec James, PA-C for f/u on CT. Pt can likely be d/c afterwards with f/u with PCP.   Final Clinical Impressions(s) / ED Diagnoses   Final diagnoses:  None    ED Discharge Orders    None       Franchot Heidelberg, PA-C 08/02/19 1553    Sherwood Gambler, MD 08/03/19 [phone]

## 2019-08-02 NOTE — ED Notes (Signed)
Pt resting in room. Previously spoke with pt who was able to communicate well and ambulate without assistance.

## 2019-08-02 NOTE — ED Triage Notes (Signed)
Pt arrives via EMS from [facility] independent living, walks with walker at baseline, [facility],  has been having abdominal pain since this morning at 2am. EMS was called at that time but patient refused transport. Family and staff wanted pt to still be seen. Pt noted to have new urinary incontinence over the last several days. Denies recent fever. EKG for EMS NSR with 1st degree block and RBBB. Vitals 139/89, hr 80, 97% RA, 98.2 Temp, denies pain.

## 2019-08-02 NOTE — ED Notes (Signed)
Pt was discharged to care of [facility]. IV removed from right arm prior to discharge.

## 2019-08-02 NOTE — ED Provider Notes (Addendum)
85 year old male presents with acute abdominal pain. He is noted to have elevated LFTs here. Has hx of gallstone ileus. Has not had his gall bladder removed. His vitals are normal. CBC is normal. AST is 377 and ALT is 549. Bili is 1.8. He is currently asymptomatic. CT abdomen/pelvis is pending at shift change.  CT does not show any acute process. Gallbladder appears normal. There is evidence of mesenteritis. There is also scarring in the right lung base with a ground-glass opacity and nodules which could be infectious or inflammatory. Also a left adrenal nodule which is stable.   5:22 PM Spoke with Dr. Carlean Purl with GI. He states if pt is asymptomatic and the rest of labs are reassuring he can f/u in the office. A hepatitis panel and tylenol level was sent although pt denies taking a lot of tylenol or heavy drinking. Strongly encouraged close PCP and GI f/u.  I discussed results with his daughter regarding f/u.    Recardo Evangelist, PA-C 08/02/19 1723    Recardo Evangelist, PA-C 08/02/19 1805    Margette Fast, MD 08/03/19 1239

## 2019-08-02 NOTE — ED Notes (Signed)
[facility] called @ 1732-per Annie Main, RN called by Levada Dy

## 2019-08-02 NOTE — ED Notes (Signed)
Pt found wandering in hallway, incontinent of urine, pulled all wires and IV out. Pt cleaned, reoriented, placed back on monitor.

## 2019-08-02 NOTE — ED Notes (Signed)
Attempted to call report to [facility], spoke with Silva Bandy, RN who states that since pt lives in independent living, no report is necessary.

## 2019-08-02 NOTE — Discharge Instructions (Addendum)
Your work up today showed elevation in liver enzymes. Avoid things that may cause liver damage including tylenol and alcohol.  Follow up with your primary care doctor and GI for further evaluation of your liver.  Return to the ER with any new, worsening, or concerning symptoms.

## 2019-08-02 NOTE — Telephone Encounter (Signed)
Prior Alec Davis patient Has abnl LFts and abdominal pain which resolved CT shows NL post chole bile ducts, steatosis Some mesenteritis and RLL process  Needs f/u [facility] or [facility] next week   If no openings with anyone I can work him in   Hepatitis panel is pending

## 2019-08-02 NOTE — ED Notes (Signed)
Spoke with patient's daughter about labs pending, CT pending, will call back for update in 2 hours.

## 2019-08-03 LAB — URINE CULTURE

## 2019-08-03 NOTE — Telephone Encounter (Signed)
Patient has been scheduled with Dr. Loletha Carrow on 08/11/19 11:00.  Patient's wife notified of the appt dates and time.

## 2019-08-08 ENCOUNTER — Encounter: Payer: Self-pay | Admitting: Family Medicine

## 2019-08-08 ENCOUNTER — Other Ambulatory Visit: Payer: Self-pay

## 2019-08-08 ENCOUNTER — Ambulatory Visit (INDEPENDENT_AMBULATORY_CARE_PROVIDER_SITE_OTHER): Payer: Medicare Other | Admitting: Family Medicine

## 2019-08-08 VITALS — BP 111/66 | HR 65 | Temp 96.7°F

## 2019-08-08 DIAGNOSIS — R945 Abnormal results of liver function studies: Secondary | ICD-10-CM

## 2019-08-08 DIAGNOSIS — R7989 Other specified abnormal findings of blood chemistry: Secondary | ICD-10-CM

## 2019-08-08 DIAGNOSIS — I10 Essential (primary) hypertension: Secondary | ICD-10-CM

## 2019-08-08 DIAGNOSIS — R110 Nausea: Secondary | ICD-10-CM | POA: Diagnosis not present

## 2019-08-08 LAB — CMP14+EGFR
ALT: 90 IU/L — ABNORMAL HIGH (ref 0–44)
AST: 67 IU/L — ABNORMAL HIGH (ref 0–40)
Albumin/Globulin Ratio: 1.3 (ref 1.2–2.2)
Albumin: 3.6 g/dL (ref 3.5–4.6)
Alkaline Phosphatase: 119 IU/L — ABNORMAL HIGH (ref 39–117)
BUN/Creatinine Ratio: 10 (ref 10–24)
BUN: 10 mg/dL (ref 10–36)
Bilirubin Total: 0.8 mg/dL (ref 0.0–1.2)
CO2: 24 mmol/L (ref 20–29)
Calcium: 9.3 mg/dL (ref 8.6–10.2)
Chloride: 98 mmol/L (ref 96–106)
Creatinine, Ser: 1.01 mg/dL (ref 0.76–1.27)
GFR calc Af Amer: 73 mL/min/{1.73_m2} (ref >59)
GFR calc non Af Amer: 63 mL/min/{1.73_m2} (ref >59)
Globulin, Total: 2.7 g/dL (ref 1.5–4.5)
Glucose: 92 mg/dL (ref 65–99)
Potassium: 4.3 mmol/L (ref 3.5–5.2)
Sodium: 139 mmol/L (ref 134–144)
Total Protein: 6.3 g/dL (ref 6.0–8.5)

## 2019-08-08 LAB — CBC
Hematocrit: 43.5 % (ref 37.5–51.0)
Hemoglobin: 14.2 g/dL (ref 13.0–17.7)
MCH: 30.3 pg (ref 26.6–33.0)
MCHC: 32.6 g/dL (ref 31.5–35.7)
MCV: 93 fL (ref 79–97)
Platelets: 320 10*3/uL (ref 150–450)
RBC: 4.68 x10E6/uL (ref 4.14–5.80)
RDW: 13 % (ref 11.6–15.4)
WBC: 8.8 10*3/uL (ref 3.4–10.8)

## 2019-08-08 NOTE — Progress Notes (Signed)
Subjective: CC: Elevated liver function tests PCP: [name], DO BSJ:Alec Davis is a 85 y.o. male presenting to clinic today for:  1.  Elevated liver function tests Patient was seen in the emergency department on 08/02/2019 noted to have elevated liver function tests.  Because the patient was asymptomatic, he was instructed to follow-up with GI outpatient.  The nurse notes that he has follow-up with gastroenterology on Friday.  He does report some nausea today but notes this is a chronic issue that is been ongoing for about 2 years now.  He did have breakfast.  No vomiting.  No hematochezia, melena.  He is having normal bowel movements.  Denies constipation or diarrhea.  Denies any abdominal pain.  He has not taken his acid reflux medicine this morning.  ROS: Per HPI  No Known Allergies Past Medical History:  Diagnosis Date  . BPH (benign prostatic hyperplasia)   . Cataract   . GERD (gastroesophageal reflux disease)   . Hyperlipidemia   . Hypertension   . PROSTATE CANCER 01/19/2011   Qualifier: Diagnosis of  By: Nils Pyle CMA ([place]), [name]    . Vitamin D deficiency     Current Outpatient Medications:  .  Cholecalciferol (VITAMIN D-1000 MAX ST) 1000 units tablet, Take 1 tablet (1,000 Units total) by mouth daily., Disp: 12 tablet, Rfl: 2 .  famotidine (PEPCID) 20 MG tablet, Take 1 tablet (20 mg total) by mouth 2 (two) times daily., Disp: 180 tablet, Rfl: 2 .  lovastatin (MEVACOR) 20 MG tablet, TAKE 1 TABLET DAILY, Disp: 90 tablet, Rfl: 0 .  Multiple Vitamin (MULTIVITAMIN WITH MINERALS) TABS tablet, Take 1 tablet by mouth daily., Disp: , Rfl:  .  vitamin E 1000 UNIT capsule, Take 1,000 Units by mouth daily., Disp: , Rfl:  Social History   Socioeconomic History  . Marital status: Married    Spouse name: emma  . Number of children: 2  . Years of education: Not on file  . Highest education level: Not on file  Occupational History  . 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 Financial resource strain: Not on file  . Food insecurity    Worry: Not on file    Inability: Not on file  . Transportation needs    Medical: Not on file    Non-medical: Not on file  Tobacco Use  . Smoking status: Former Smoker    Quit date: 01/25/1950    Years since quitting: 69.5  . Smokeless tobacco: Never Used  Substance and Sexual Activity  . Alcohol use: No  . Drug use: No  . Sexual activity: Never  Lifestyle  . Physical activity    Days per week: Not on file    Minutes per session: Not on file  . Stress: Not on file  Relationships  . Social Herbalist on phone: Not on file    Gets together: Not on file    Attends religious service: Not on file    Active member of club or organization: Not on file    Attends meetings of clubs or organizations: Not on file    Relationship status: Not on file  . Intimate partner violence    Fear of current or ex partner: Not on file    Emotionally abused: Not on file    Physically abused: Not on file    Forced sexual activity: Not on file  Other  Topics Concern  . Not on file  Social History Narrative  . Not on file   Family History  Problem Relation Age of Onset  . Suicidality Father   . Alzheimer's disease Brother   . Other Brother        polio  . Arthritis Son        shoulders     Objective: Office vital signs reviewed. BP 111/66   Pulse 65   Temp (!) 96.7 F (35.9 C) (Oral)   Physical Examination:  General: Awake, alert, elderly, limited mobility. No acute distress HEENT: Normal, sclera white.  Cardio: regular rate and rhythm, S1S2 heard, no murmurs appreciated Pulm: clear to auscultation bilaterally, no wheezes, rhonchi or rales; normal work of breathing on room air GI: soft, non-tender, non-distended, bowel sounds present x4, no hepatomegaly, no splenomegaly, no masses  Assessment/ Plan: 85 y.o. male   1. Elevated liver  function tests I reviewed the emergency department note and recommendations.  CMP and CBC repeated.  Will forward to Dr. Arelia Longest once these have resulted. - CMP14+EGFR - CBC  2. Essential hypertension Controlled without medication - CMP14+EGFR  3. Chronic nausea Possibly related to uncontrolled GERD.  I advised him to use his Pepcid.  His abdominal exam was unremarkable today.  He has follow-up with GI on Friday.   No orders of the defined types were placed in this encounter.  No orders of the defined types were placed in this encounter.    [name], DO [place] [phone]

## 2019-08-08 NOTE — Patient Instructions (Signed)

## 2019-08-11 ENCOUNTER — Encounter: Payer: Self-pay | Admitting: Gastroenterology

## 2019-08-11 ENCOUNTER — Ambulatory Visit (INDEPENDENT_AMBULATORY_CARE_PROVIDER_SITE_OTHER): Payer: Medicare Other | Admitting: Gastroenterology

## 2019-08-11 VITALS — BP 132/68 | HR 68 | Temp 97.9°F | Ht 75.0 in | Wt 202.0 lb

## 2019-08-11 DIAGNOSIS — R1010 Upper abdominal pain, unspecified: Secondary | ICD-10-CM

## 2019-08-11 DIAGNOSIS — R7989 Other specified abnormal findings of blood chemistry: Secondary | ICD-10-CM

## 2019-08-11 DIAGNOSIS — R110 Nausea: Secondary | ICD-10-CM | POA: Diagnosis not present

## 2019-08-11 DIAGNOSIS — R945 Abnormal results of liver function studies: Secondary | ICD-10-CM | POA: Diagnosis not present

## 2019-08-11 NOTE — Progress Notes (Signed)
[facility] Consult Note:  History: Alec Davis 08/11/2019  Referring provider: [name], DO  Reason for consult/chief complaint: Nausea (lots of nausea) and Abdominal Pain (mid abdomen )   Subjective  HPI: ED visit 8/12 for abd pain and found to have elevated LFTs.  Alec Davis is a very pleasant 85 year old man accompanied by an aide from his long-term care facility and referred by primary care.  He is alert and conversational, but has some memory difficulty, which limits his history.  He cannot clearly recall the events of the ED visit, but believes he must of been having abdominal pain.  ED provider report indicates abdominal pain for about a day that was mid and upper.  There were no other associated reported symptoms such as fever vomiting diarrhea or bleeding.  He apparently had no residual symptoms by the time of ED evaluation, and was discharged back to his nursing facility.  He believes he has been feeling well since then.  The staff member with him today only met him earlier this week, but says that 3 days ago he felt unwell much of the day with "indigestion" and nausea and he did not want to eat.  By the next day he apparently felt fine, and he continues to feel well today.  His appetite has reportedly been good in general and he has been maintaining stable weight.  His bowel habits are reportedly regular with no rectal bleeding.  He is not known to be on any new medicines, no definite sick contacts.  Noted below on the CT is also a right lower lobe groundglass opacity, but there has not been reported fever cough or sputum production or dyspnea.  Primary care saw him since the ED visit and felt these findings were probably asymptomatic, and they plan to follow them radiographically.  Repeat LFTs were also significantly improved.   ROS:  Review of Systems  Constitutional: Negative for appetite change and unexpected weight change.  HENT: Negative  for mouth sores and voice change.   Eyes: Negative for pain and redness.  Respiratory: Negative for cough and shortness of breath.   Cardiovascular: Negative for chest pain and palpitations.  Genitourinary: Negative for dysuria and hematuria.  Musculoskeletal: Positive for arthralgias. Negative for myalgias.  Skin: Negative for pallor and rash.  Neurological: Negative for weakness and headaches.       Memory trouble  Hematological: Negative for adenopathy.     Past Medical History: Past Medical History:  Diagnosis Date  . BPH (benign prostatic hyperplasia)   . Cataract   . GERD (gastroesophageal reflux disease)   . Hyperlipidemia   . Hypertension   . PROSTATE CANCER 01/19/2011   Qualifier: Diagnosis of  By: [name], [name]    . Vitamin D deficiency      Past Surgical History: Past Surgical History:  Procedure Laterality Date  . cataracts  Bilateral    Dr [name]  . 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 SPINE SURGERY     in [place] age 22    Reviewed 2018 Op Note re: resection segment ileum with primary anastomosis for gallstone ileus  Family History: Family History  Problem Relation Age of Onset  . Suicidality Father   . Alzheimer's disease Brother   . Other Brother        polio  . Arthritis  Son        shoulders   . Colon cancer Neg Hx   . Stomach cancer Neg Hx   . Pancreatic cancer Neg Hx     Social History: Social History   Socioeconomic History  . Marital status: Married    Spouse name: emma  . Number of children: 2  . Years of education: Not on file  . Highest education level: Not on file  Occupational History  . 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 Financial resource strain: Not on file  . Food insecurity    Worry: Not on  file    Inability: Not on file  . Transportation needs    Medical: Not on file    Non-medical: Not on file  Tobacco Use  . Smoking status: Former Smoker    Quit date: 01/25/1950    Years since quitting: 69.5  . Smokeless tobacco: Never Used  Substance and Sexual Activity  . Alcohol use: No  . Drug use: No  . Sexual activity: Never  Lifestyle  . Physical activity    Days per week: Not on file    Minutes per session: Not on file  . Stress: Not on file  Relationships  . Social Herbalist on phone: Not on file    Gets together: Not on file    Attends religious service: Not on file    Active member of club or organization: Not on file    Attends meetings of clubs or organizations: Not on file    Relationship status: Not on file  Other Topics Concern  . Not on file  Social History Narrative  . 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 Cholecalciferol (VITAMIN D-1000 MAX ST) 1000 units tablet Take 1 tablet (1,000 Units total) by mouth daily. 12 tablet 2  . famotidine (PEPCID) 20 MG tablet Take 1 tablet (20 mg total) by mouth 2 (two) times daily. 180 tablet 2  . lovastatin (MEVACOR) 20 MG tablet TAKE 1 TABLET DAILY 90 tablet 0  . Multiple Vitamin (MULTIVITAMIN WITH MINERALS) TABS tablet Take 1 tablet by mouth daily.    . vitamin E 1000 UNIT capsule Take 1,000 Units by mouth daily.     No current facility-administered medications for this visit.       ___________________________________________________________________ Objective   Exam:  BP 132/68   Pulse 68   Temp 97.9 F (36.6 C)   Ht 6' 3" (1.905 m)   Wt 202 lb (91.6 kg)   BMI 25.25 kg/m    General: Well-appearing, well-nourished.  Pleasant and conversational, ambulatory with the aid of a walker and gets on exam table without assistance.  Eyes: sclera  anicteric, no redness  ENT: oral mucosa moist without lesions, no cervical or supraclavicular lymphadenopathy  CV: RRR without murmur, S1/S2, no JVD, no peripheral edema  Resp: clear to auscultation bilaterally, normal RR and effort noted  GI: soft, no tenderness, with active bowel sounds. No guarding or palpable organomegaly noted.  Lower midline scar without hernia  Skin; warm and dry, no rash or jaundice noted.  Seborrheic keratosis  Neuro: awake, alert and oriented x 3. Normal gross motor function and fluent speech  Labs:  CMP Latest Ref Rng & Units 08/08/2019 08/02/2019  12/01/2018  Glucose 65 - 99 mg/dL 92 113(H) 110(H)  BUN 10 - 36 mg/dL 10 11 8(L)  Creatinine 0.76 - 1.27 mg/dL 1.01 1.02 1.01  Sodium 134 - 144 mmol/L 139 136 141  Potassium 3.5 - 5.2 mmol/L 4.3 3.5 4.4  Chloride 96 - 106 mmol/L 98 102 101  CO2 20 - 29 mmol/L _0 Calcium 8.6 - 10.2 mg/dL 9.3 8.6(L) 9.1  Total Protein 6.0 - 8.5 g/dL 6.3 6.9 6.5  Total Bilirubin 0.0 - 1.2 mg/dL 0.8 1.8(H) 0.3  Alkaline Phos 39 - 117 IU/L 119(H) 96 94  AST 0 - 40 IU/L 67(H) 377(H) 17  ALT 0 - 44 IU/L 90(H) 549(H) 11   CBC Latest Ref Rng & Units 08/08/2019 08/02/2019 12/01/2018  WBC 3.4 - 10.8 x10E3/uL 8.8 9.5 8.6  Hemoglobin 13.0 - 17.7 g/dL 14.2 14.4 13.9  Hematocrit 37.5 - 51.0 % 43.5 43.5 41.9  Platelets 150 - 450 x10E3/uL 320 200 269   No COVID testing  Radiologic Studies:  CLINICAL DATA:  Abdominal pain for 1 day   EXAM: CT ABDOMEN AND PELVIS WITH CONTRAST   TECHNIQUE: Multidetector CT imaging of the abdomen and pelvis was performed using the standard protocol following bolus administration of intravenous contrast.   CONTRAST:  173m OMNIPAQUE IOHEXOL 300 MG/ML  SOLN   COMPARISON:  CT abdomen pelvis 04/03/2017, same day radiograph   FINDINGS: Lower chest: There is increasing consolidation in the right lung base in the region of scarring with adjacent areas of ground-glass opacity. Several sub solid  nodules present as well measuring up to 6 mm. Stable appearance of a 7 mm partially calcified nodule in the left lung base. Left atrial enlargement. Cardiac dimensions are otherwise unremarkable. Extensive mitral annular and aortic leaflet calcifications. Three-vessel coronary artery disease present. Atherosclerotic plaque within the normal caliber aorta.   Hepatobiliary: 2 mm calcification in the right lobe liver (3/29), likely granuloma. Diffuse hepatic hypoattenuation compatible with hepatic steatosis. No concerning focal liver abnormality is seen. Gallbladder is largely decompressed. No gallstones, gallbladder wall thickening, or biliary dilatation.   Pancreas: Partial fatty replacement of the pancreas. No ductal dilatation or peripancreatic inflammation.   Spleen: Normal in size without focal abnormality. Small accessory splenules   Adrenals/Urinary Tract: Stable size and appearance of a 1.7 cm mixed attenuation nodule in the body of the left adrenal gland. No right adrenal lesion. Bilateral fluid attenuation renal cysts are again seen, decreased in size on the right now measuring only 3.2 cm, previously 5.6 cm. Mild bilateral nonspecific perinephric stranding, a nonspecific finding though may correlate with either age or decreased renal function. Kidneys are otherwise unremarkable, without renal calculi, suspicious lesion, or hydronephrosis. Bladder is unremarkable.   Stomach/Bowel: Distal esophagus, stomach and duodenal sweep are unremarkable. No bowel wall thickening or dilatation. No evidence of obstruction. A normal appendix is visualized. Scattered colonic diverticula without focal pericolonic inflammation to suggest diverticulitis.   Vascular/Lymphatic: Atherosclerotic plaque within the normal caliber aorta. No pathologically enlarged lymph nodes   Reproductive: Prostatomegaly with several metallic foci suggesting either fiducials or cyst brachytherapy implants.    Other: Focal region of mid mesenteric hazy stranding with numerous reactive appearing clustered mid mesenteric lymph nodes compatible with mesenteritis. No abdominopelvic free fluid or free gas. No bowel containing hernias.   Musculoskeletal: Multilevel degenerative changes are present in the imaged portions of the spine. No acute osseous abnormality or suspicious osseous lesion.   IMPRESSION: 1. No evidence of acute intra-abdominal process. 2.  Focal region of mid mesenteric hazy stranding with numerous reactive appearing clustered mid mesenteric lymph nodes compatible with mesenteritis. 3. Interval increase in consolidation in the right lung base in the region of scarring with adjacent areas of ground-glass opacity and multiple ground-glass nodules. Findings are nonspecific, and could reflect an infectious/inflammatory process including potential viral pneumonia. Non-contrast chest CT at 3-6 months is recommended. If nodules persist, subsequent management will be based upon the most suspicious nodule(s). This recommendation follows the consensus statement: Guidelines for Management of Incidental Pulmonary Nodules Detected on CT Images: From the Fleischner Society 2017; Radiology 2017; 284:228-243. 4. Unchanged size and appearance of a 1.7 cm mixed attenuation nodule in the body of the left adrenal gland. Prolonged course of stability favors benignity. Further evaluation with additional 12 month follow-up adrenal CT should be based on a discussion with the patient. 5. Aortic Atherosclerosis (ICD10-I70.0).     Electronically Signed   By: Lovena Le M.D.   On: 08/02/2019 16:28  Images personally reviewed.  Assessment: Encounter Diagnoses  Name Primary?  Marland Kitchen Upper abdominal pain Yes  . LFTs abnormal   . Nausea     He probably had an acute infectious illness causing the pain and CT scan findings.  The cause of significant transaminitis in relation to the pain and CT scan  findings is unclear, but might also be infectious.  He was not COVID tested, but had no respiratory symptoms. He has apparently been well since then with the exception of several days ago.  Primary care note from about 6 months ago indicates some ill-defined "indigestion".  CT scan does not show obstruction, mass, gallstones or biliary ductal dilatation, speaking against choledocholithiasis.  Also, the pattern of LFT elevation was not typical for that.  Plan:  At this point, I would continue to manage him conservatively since his symptoms are improved and his LFTs are much improved as well.  His upper digestive symptoms from a few days ago apparently improved after Pepcid, and that can certainly be used as needed.  He does not seem to need further work-up or a new medicine at this point.  I would be conservative regarding the use of antiemetics at this age due to the risk of potential side effects.  I will gladly see him again as needed, certainly if he has ongoing upper digestive symptoms, and if his LFTs do not normalize.  Total time 45 minutes, over half spent in record review, counseling and coordinating care.  Thank you for the courtesy of this consult.  Please call me with any questions or concerns.  Nelida Meuse III  CC: Referring provider noted above

## 2019-08-11 NOTE — Patient Instructions (Signed)
If you are age 83 or older, your body mass index should be between 23-30. Your Body mass index is 25.25 kg/m. If this is out of the aforementioned range listed, please consider follow up with your Primary Care Provider.  If you are age 61 or younger, your body mass index should be between 19-25. Your Body mass index is 25.25 kg/m. If this is out of the aformentioned range listed, please consider follow up with your Primary Care Provider.   Follow up as needed. [phone]  It was a pleasure to see you today!  Dr. Loletha Carrow

## 2019-09-11 ENCOUNTER — Other Ambulatory Visit: Payer: Self-pay | Admitting: *Deleted

## 2019-09-11 MED ORDER — LOVASTATIN 20 MG PO TABS: 20.0000 mg | ORAL_TABLET | Freq: Every day | ORAL | 1 refills | Status: DC

## 2019-12-16 ENCOUNTER — Other Ambulatory Visit: Payer: Self-pay | Admitting: Family Medicine

## 2020-04-09 ENCOUNTER — Telehealth: Payer: Self-pay | Admitting: Family Medicine

## 2020-04-09 NOTE — Chronic Care Management (AMB) (Signed)
  Chronic Care Management   Note  04/09/2020 Name: Alec Davis MRN: [id number] DOB: [birth year]  Alec Davis is a 86 y.o. year old male who is a primary care patient of [name], DO. I reached out to [employer] by phone today in response to a referral sent by Mr. Alec Davis's health plan.     [name] was given information about Chronic Care Management services today including:  1. CCM service includes personalized support from designated clinical staff supervised by his physician, including individualized plan of care and coordination with other care providers 2. 24/7 contact phone numbers for assistance for urgent and routine care needs. 3. Service will only be billed when office clinical staff spend 20 minutes or more in a month to coordinate care. 4. Only one practitioner may furnish and bill the service in a calendar month. 5. The patient may stop CCM services at any time (effective at the end of the month) by phone call to the office staff. 6. The patient will be responsible for cost sharing (co-pay) of up to 20% of the service fee (after annual deductible is met).  Patient agreed to services and verbal consent obtained.   Follow up plan: Telephone appointment with care management team member scheduled for:05/24/2020  [address], [place], [place] [address] Direct Dial: [email or web address]: [email or web address]

## 2020-05-09 ENCOUNTER — Ambulatory Visit (INDEPENDENT_AMBULATORY_CARE_PROVIDER_SITE_OTHER): Payer: Medicare Other | Admitting: Nurse Practitioner

## 2020-05-09 ENCOUNTER — Other Ambulatory Visit: Payer: Self-pay

## 2020-05-09 ENCOUNTER — Encounter: Payer: Self-pay | Admitting: Nurse Practitioner

## 2020-05-09 VITALS — BP 126/73 | HR 80 | Temp 98.0°F | Ht 75.0 in | Wt 219.6 lb

## 2020-05-09 DIAGNOSIS — N3945 Continuous leakage: Secondary | ICD-10-CM

## 2020-05-09 DIAGNOSIS — E559 Vitamin D deficiency, unspecified: Secondary | ICD-10-CM

## 2020-05-09 DIAGNOSIS — R600 Localized edema: Secondary | ICD-10-CM | POA: Diagnosis not present

## 2020-05-09 DIAGNOSIS — E7800 Pure hypercholesterolemia, unspecified: Secondary | ICD-10-CM | POA: Diagnosis not present

## 2020-05-09 DIAGNOSIS — L989 Disorder of the skin and subcutaneous tissue, unspecified: Secondary | ICD-10-CM | POA: Insufficient documentation

## 2020-05-09 MED ORDER — FUROSEMIDE 20 MG PO TABS: 20.0000 mg | ORAL_TABLET | Freq: Every day | ORAL | 0 refills | Status: DC

## 2020-05-09 MED ORDER — TAMSULOSIN HCL 0.4 MG PO CAPS: 0.4000 mg | ORAL_CAPSULE | Freq: Every day | ORAL | 0 refills | Status: DC

## 2020-05-09 NOTE — Assessment & Plan Note (Signed)
Patient is well controlled on current medication no changes to medication. labs collected today.  Results pending, patient has not been seen in clinic since the last PCP retired.  Provided education to patient to maintain healthy diet moderate exercise and follow-up as required.  Adjustments will be made to current medication dose when lab results come back.

## 2020-05-09 NOTE — Assessment & Plan Note (Signed)
Patient is reporting new urinary incontinence.  Worsening symptoms in the last 3 months.  Patient awakes several times per night to urinate.  Provided education to patient and family.  Written handout provided.  Patient started on Flomax 0.4 mg once daily.  Labs collected.  Prescription sent to pharmacy.  Patient follow-up.  Patient to follow-up with Dr. Lajuana Ripple in 2 to 3 weeks.

## 2020-05-09 NOTE — Progress Notes (Signed)
Established Patient Office Visit  Subjective:  Patient ID: Alec Davis, male    DOB: 08/25/1923  Age: 86 y.o. MRN: [id number]  CC:  Chief Complaint  Patient presents with  . Medical Management of Chronic Issues  . Hyperlipidemia    HPI [name] presents with hyperlipidemia. Patient was diagnosed in 2014. Compliance with treatment has been good; current medication levastatin 20 mg tablet daily.  The patient is compliant with medications, maintains a low cholesterol diet , follows up as directed , and maintains a moderate exercise regimen . The patient denies experiencing any hypercholesterolemia related symptoms.   Patient following up on vitamin D deficiency.  No current changes to medication.  Patient tolerating medication.  Patient was diagnosed in 2012. compliance with treatment has been good; current medication is vitamin D tablets.  Patient compliant with medication maintains a well-balanced diet and moderate exercise. patient has denies experiencing any signs or symptoms of vitamin D deficiency.  Patient is to follow-up as directed.   Concerning lower extremity edema, this is a new problem for patient in the last for 4 weeks.  +2 pitting edema bilateral lower extremity with right leg greater than left.  Patient is not experiencing shortness of breath or pain.   Patient is experiencing urinary incontinence.  With worsening symptoms in the last 2 months.  Patient wakes up several times at night to use the restroom.  Patient is having feelings of fullness and not emptying bladder completely.  Patient is not reporting dysuria, pelvic pain, nausea, or burning with urination. Past Medical History:  Diagnosis Date  . BPH (benign prostatic hyperplasia)   . Cataract   . GERD (gastroesophageal reflux disease)   . Hyperlipidemia   . Hypertension   . PROSTATE CANCER 01/19/2011   Qualifier: Diagnosis of  By: Nils Pyle CMA ([place]), [name]    . Vitamin D deficiency     Past  Surgical History:  Procedure Laterality Date  . cataracts  Bilateral    Dr [name]  . 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 SPINE SURGERY     in [place] age 74     Family History  Problem Relation Age of Onset  . Suicidality Father   . Alzheimer's disease Brother   . Other Brother        polio  . Arthritis Son        shoulders   . Colon cancer Neg Hx   . Stomach cancer Neg Hx   . Pancreatic cancer Neg Hx     Social History   Socioeconomic History  . Marital status: Married    Spouse name: emma  . Number of children: 2  . Years of education: Not on file  . Highest education level: Not on file  Occupational History  . 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 Smoking status: Former Smoker    Quit date: 01/25/1950    Years since quitting: 70.3  . Smokeless tobacco: Never Used  Substance and Sexual Activity  . Alcohol use: No  . Drug use: No  . Sexual activity: Never  Other Topics Concern  . Not on file  Social History Narrative  . Not on file   Social Determinants of Health   Financial Resource Strain:   . Difficulty of Paying  Living Expenses:   Food Insecurity:   . Worried About Charity fundraiser in the Last Year:   . Arboriculturist in the Last Year:   Transportation Needs:   . Film/video editor (Medical):   Marland Kitchen Lack of Transportation (Non-Medical):   Physical Activity:   . Days of Exercise per Week:   . Minutes of Exercise per Session:   Stress:   . Feeling of Stress :   Social Connections:   . Frequency of Communication with Friends and Family:   . Frequency of Social Gatherings with Friends and Family:   . Attends Religious Services:   . Active Member of Clubs or Organizations:   . Attends Archivist Meetings:   Marland Kitchen Marital Status:    Intimate Partner Violence:   . Fear of Current or Ex-Partner:   . Emotionally Abused:   Marland Kitchen Physically Abused:   . Sexually Abused:     Outpatient Medications Prior to Visit  Medication Sig Dispense Refill  . lovastatin (MEVACOR) 20 MG tablet TAKE ONE TABLET BY MOUTH EVERY DAY 90 tablet 1  . Multiple Vitamin (MULTIVITAMIN WITH MINERALS) TABS tablet Take 1 tablet by mouth daily.    . Cholecalciferol (VITAMIN D-1000 MAX ST) 1000 units tablet Take 1 tablet (1,000 Units total) by mouth daily. 12 tablet 2  . famotidine (PEPCID) 20 MG tablet Take 1 tablet (20 mg total) by mouth 2 (two) times daily. 180 tablet 2  . vitamin E 1000 UNIT capsule Take 1,000 Units by mouth daily.     No facility-administered medications prior to visit.    No Known Allergies  ROS Review of Systems  Constitutional: Negative for activity change, appetite change, fatigue and fever.  HENT: Negative.   Eyes: Negative.   Respiratory: Negative for cough, chest tightness and shortness of breath.   Cardiovascular: Negative for chest pain and palpitations.  Gastrointestinal: Negative for abdominal distention, abdominal pain, constipation, diarrhea and nausea.  Endocrine: Negative.   Genitourinary: Positive for frequency and urgency. Negative for difficulty urinating and dysuria.       Intermittent pain   Musculoskeletal: Negative for arthralgias and myalgias.  Skin: Negative for rash.  Neurological: Negative for facial asymmetry.  Psychiatric/Behavioral: The patient is not nervous/anxious.       Objective:    Physical Exam  Constitutional: He is oriented to person, place, and time. He appears well-developed and well-nourished.  HENT:  Head: Normocephalic.  Mouth/Throat: Oropharynx is clear and moist.  Eyes: Conjunctivae are normal.  Cardiovascular: Normal rate, regular rhythm and normal heart sounds.  Pulmonary/Chest: Breath sounds normal.  Abdominal: Soft. Bowel sounds are normal.  Musculoskeletal:         General: Edema present.     Cervical back: Neck supple.  Neurological: He is oriented to person, place, and time.  Skin: Skin is dry. Rash noted.  Scalp lesion  Psychiatric: He has a normal mood and affect.    BP 126/73   Pulse 80   Temp 98 F (36.7 C)   Ht 6' 3"  (1.905 m)   Wt 219 lb 9.6 oz (99.6 kg)   SpO2 96%   BMI 27.45 kg/m  Wt Readings from Last 3 Encounters:  05/09/20 219 lb 9.6 oz (99.6 kg)  08/11/19 202 lb (91.6 kg)  08/02/19 215 lb (97.5 kg)      Office Visit from 05/09/2020 in [place]  PHQ-9 Total Score  3      Lab Results  Component Value Date   TSH 1.360 07/19/2017   Lab Results  Component Value Date   WBC 8.8 08/08/2019   HGB 14.2 08/08/2019   HCT 43.5 08/08/2019   MCV 93 08/08/2019   PLT 320 08/08/2019   Lab Results  Component Value Date   NA 139 08/08/2019   K 4.3 08/08/2019   CO2 24 08/08/2019   GLUCOSE 92 08/08/2019   BUN 10 08/08/2019   CREATININE 1.01 08/08/2019   BILITOT 0.8 08/08/2019   ALKPHOS 119 (H) 08/08/2019   AST 67 (H) 08/08/2019   ALT 90 (H) 08/08/2019   PROT 6.3 08/08/2019   ALBUMIN 3.6 08/08/2019   CALCIUM 9.3 08/08/2019   ANIONGAP 9 08/02/2019   Lab Results  Component Value Date   CHOL 130 12/01/2018   Lab Results  Component Value Date   HDL 41 12/01/2018   Lab Results  Component Value Date   LDLCALC 67 12/01/2018   Lab Results  Component Value Date   TRIG 108 12/01/2018   Lab Results  Component Value Date   CHOLHDL 3.2 12/01/2018   Lab Results  Component Value Date   HGBA1C 4.8 09/11/2015      Assessment & Plan:  Vitamin D deficiency Patient is well managed on current regimen.  No changes to current medication.  Labs collected today results pending.  Education provided to patient with printed handout.  Follow-up in 2 weeks with Dr. Lajuana Ripple.  Hyperlipemia Patient is well controlled on current medication no changes to medication. labs collected today.  Results  pending, patient has not been seen in clinic since the last PCP retired.  Provided education to patient to maintain healthy diet moderate exercise and follow-up as required.  Adjustments will be made to current medication dose when lab results come back.  Scalp lesion New scalp lesion found on patient's head on assessment today.  This is new for patient, he reports scalp leison is very itcy. Area around lesion is red in color with a white scaly top.  After assessment referral made to dermatology.  Education provided to patient on the importance of sunscreen while outdoors.  Patient will follow up as required.  Continuous leakage of urine Patient is reporting new urinary incontinence.  Worsening symptoms in the last 3 months.  Patient awakes several times per night to urinate.  Provided education to patient and family.  Written handout provided.  Patient started on Flomax 0.4 mg once daily.  Labs collected.  Prescription sent to pharmacy.  Patient follow-up.  Patient to follow-up with Dr. Lajuana Ripple in 2 to 3 weeks.  Concerning patient's bilateral lower extremity edema, +2 pitting with right leg greater than left, patient started on Lasix 20 mg 1 pill p.o. daily.  Patient recommendation; elevate feet while sitting down, compression socks, education provided to patient with written handouts available.  Prescription sent to pharmacy.  Patient to follow-up with Dr. Lajuana Ripple in 2 to 3 weeks.     Problem List Items Addressed This Visit      Musculoskeletal and Integument   Scalp lesion    New scalp lesion found on patient's head on assessment today.  This is new for patient, he reports scalp leison is very itcy. Area around lesion is red in color with a white scaly top.  After assessment referral made to dermatology.  Education provided to patient on the importance of sunscreen while outdoors.  Patient will follow up as required.      Relevant Orders   Ambulatory referral  to Dermatology     Other    Vitamin D deficiency    Patient is well managed on current regimen.  No changes to current medication.  Labs collected today results pending.  Education provided to patient with printed handout.  Follow-up in 2 weeks with Dr. Lajuana Ripple.      Relevant Orders   VITAMIN D 25 Hydroxy (Vit-D Deficiency, Fractures)   Hyperlipemia    Patient is well controlled on current medication no changes to medication. labs collected today.  Results pending, patient has not been seen in clinic since the last PCP retired.  Provided education to patient to maintain healthy diet moderate exercise and follow-up as required.  Adjustments will be made to current medication dose when lab results come back.      Relevant Medications   furosemide (LASIX) 20 MG tablet   Other Relevant Orders   CBC with Differential/Platelet   CMP14+EGFR   Lipid panel   Continuous leakage of urine - Primary    Patient is reporting new urinary incontinence.  Worsening symptoms in the last 3 months.  Patient awakes several times per night to urinate.  Provided education to patient and family.  Written handout provided.  Patient started on Flomax 0.4 mg once daily.  Labs collected.  Prescription sent to pharmacy.  Patient follow-up.  Patient to follow-up with Dr. Lajuana Ripple in 2 to 3 weeks.      Relevant Medications   tamsulosin (FLOMAX) 0.4 MG CAPS capsule   Edema, lower extremity    Concerning patient's bilateral lower extremity edema, +2 pitting with right leg greater than left, patient started on Lasix 20 mg 1 pill p.o. daily.  Patient recommendation; elevate feet while sitting down, compression socks, education provided to patient with written handouts available.  Prescription sent to pharmacy.  Patient to follow-up with Dr. Lajuana Ripple in 2 to 3 weeks.        Relevant Medications   furosemide (LASIX) 20 MG tablet      Meds ordered this encounter  Medications  . tamsulosin (FLOMAX) 0.4 MG CAPS capsule    Sig: Take 1 capsule  (0.4 mg total) by mouth daily.    Dispense:  30 capsule    Refill:  0    Order Specific Question:   Supervising Kerria Sapien    Answer:   Caryl Pina A [id number]  . furosemide (LASIX) 20 MG tablet    Sig: Take 1 tablet (20 mg total) by mouth daily.    Dispense:  14 tablet    Refill:  0    Order Specific Question:   Supervising Markis Langland    Answer:   Caryl Pina A [[id number]]    Follow-up: Return in about 2 weeks (around 05/23/2020) for New medication.    Ivy Lynn, NP

## 2020-05-09 NOTE — Assessment & Plan Note (Signed)
New scalp lesion found on patient's head on assessment today.  This is new for patient, he reports scalp leison is very itcy. Area around lesion is red in color with a white scaly top.  After assessment referral made to dermatology.  Education provided to patient on the importance of sunscreen while outdoors.  Patient will follow up as required.

## 2020-05-09 NOTE — Patient Instructions (Addendum)
Vitamin D deficiency Patient is well managed on current regimen.  No changes to current medication.  Labs collected today results pending.  Education provided to patient with printed handout.  Follow-up in 2 weeks with Dr. Lajuana Ripple.  Hyperlipemia Patient is well controlled on current medication no changes to medication. labs collected today.  Results pending, patient has not been seen in clinic since the last PCP retired.  Provided education to patient to maintain healthy diet moderate exercise and follow-up as required.  Adjustments will be made to current medication dose when lab results come back.  Scalp lesion New scalp lesion found on patient's head on assessment today.  This is new for patient, he reports scalp leison is very itcy. Area around lesion is red in color with a white scaly top.  After assessment referral made to dermatology.  Education provided to patient on the importance of sunscreen while outdoors.  Patient will follow up as required.  Continuous leakage of urine Patient is reporting new urinary incontinence.  Worsening symptoms in the last 3 months.  Patient awakes several times per night to urinate.  Provided education to patient and family.  Written handout provided.  Patient started on Flomax 0.4 mg once daily.  Labs collected.  Prescription sent to pharmacy.  Patient follow-up.  Patient to follow-up with Dr. Lajuana Ripple in 2 to 3 weeks.  Edema, lower extremity Concerning patient's bilateral lower extremity edema, +2 pitting with right leg greater than left, patient started on Lasix 20 mg 1 pill p.o. daily.  Patient recommendation; elevate feet while sitting down, compression socks, education provided to patient with written handouts available.  Prescription sent to pharmacy.  Patient to follow-up with Dr. Lajuana Ripple in 2 to 3 weeks.     High Cholesterol  High cholesterol is a condition in which the blood has high levels of a white, waxy, fat-like substance  (cholesterol). The human body needs small amounts of cholesterol. The liver makes all the cholesterol that the body needs. Extra (excess) cholesterol comes from the food that we eat. Cholesterol is carried from the liver by the blood through the blood vessels. If you have high cholesterol, deposits (plaques) may build up on the walls of your blood vessels (arteries). Plaques make the arteries narrower and stiffer. Cholesterol plaques increase your risk for heart attack and stroke. Work with your health care provider to keep your cholesterol levels in a healthy range. What increases the risk? This condition is more likely to develop in people who:  Eat foods that are high in animal fat (saturated fat) or cholesterol.  Are overweight.  Are not getting enough exercise.  Have a family history of high cholesterol. What are the signs or symptoms? There are no symptoms of this condition. How is this diagnosed? This condition may be diagnosed from the results of a blood test.  If you are older than age 72, your health care provider may check your cholesterol every 4-6 years.  You may be checked more often if you already have high cholesterol or other risk factors for heart disease. The blood test for cholesterol measures:  "Bad" cholesterol (LDL cholesterol). This is the main type of cholesterol that causes heart disease. The desired level for LDL is less than 100.  "Good" cholesterol (HDL cholesterol). This type helps to protect against heart disease by cleaning the arteries and carrying the LDL away. The desired level for HDL is 60 or higher.  Triglycerides. These are fats that the body can store or burn  for energy. The desired number for triglycerides is lower than 150.  Total cholesterol. This is a measure of the total amount of cholesterol in your blood, including LDL cholesterol, HDL cholesterol, and triglycerides. A healthy number is less than 200. How is this treated? This condition is  treated with diet changes, lifestyle changes, and medicines. Diet changes  This may include eating more whole grains, fruits, vegetables, nuts, and fish.  This may also include cutting back on red meat and foods that have a lot of added sugar. Lifestyle changes  Changes may include getting at least 40 minutes of aerobic exercise 3 times a week. Aerobic exercises include walking, biking, and swimming. Aerobic exercise along with a healthy diet can help you maintain a healthy weight.  Changes may also include quitting smoking. Medicines  Medicines are usually given if diet and lifestyle changes have failed to reduce your cholesterol to healthy levels.  Your health care provider may prescribe a statin medicine. Statin medicines have been shown to reduce cholesterol, which can reduce the risk of heart disease. Follow these instructions at home: Eating and drinking If told by your health care provider:  Eat chicken (without skin), fish, veal, shellfish, ground [place] breast, and round or loin cuts of red meat.  Do not eat fried foods or fatty meats, such as hot dogs and salami.  Eat plenty of fruits, such as apples.  Eat plenty of vegetables, such as broccoli, potatoes, and carrots.  Eat beans, peas, and lentils.  Eat grains such as barley, rice, couscous, and bulgur wheat.  Eat pasta without cream sauces.  Use skim or nonfat milk, and eat low-fat or nonfat yogurt and cheeses.  Do not eat or drink whole milk, cream, ice cream, egg yolks, or hard cheeses.  Do not eat stick margarine or tub margarines that contain trans fats (also called partially hydrogenated oils).  Do not eat saturated tropical oils, such as coconut oil and palm oil.  Do not eat cakes, cookies, crackers, or other baked goods that contain trans fats.  General instructions  Exercise as directed by your health care provider. Increase your activity level with activities such as gardening, walking, and taking the  stairs.  Take over-the-counter and prescription medicines only as told by your health care provider.  Do not use any products that contain nicotine or tobacco, such as cigarettes and e-cigarettes. If you need help quitting, ask your health care provider.  Keep all follow-up visits as told by your health care provider. This is important. Contact a health care provider if:  You are struggling to maintain a healthy diet or weight.  You need help to start on an exercise program.  You need help to stop smoking. Get help right away if:  You have chest pain.  You have trouble breathing. This information is not intended to replace advice given to you by your health care provider. Make sure you discuss any questions you have with your health care provider. Document Revised: 12/10/2017 Document Reviewed: 06/06/2016 Elsevier Patient Education  [place].  Edema  Edema is when you have too much fluid in your body or under your skin. Edema may make your legs, feet, and ankles swell up. Swelling is also common in looser tissues, like around your eyes. This is a common condition. It gets more common as you get older. There are many possible causes of edema. Eating too much salt (sodium) and being on your feet or sitting for a long time can  cause edema in your legs, feet, and ankles. Hot weather may make edema worse. Edema is usually painless. Your skin may look swollen or shiny. Follow these instructions at home: Keep the swollen body part raised (elevated) above the level of your heart when you are sitting or lying down. Do not sit still or stand for a long time. Do not wear tight clothes. Do not wear garters on your upper legs. Exercise your legs. This can help the swelling go down. Wear elastic bandages or support stockings as told by your doctor. Eat a low-salt (low-sodium) diet to reduce fluid as told by your doctor. Depending on the cause of your swelling, you may need to limit how  much fluid you drink (fluid restriction). Take over-the-counter and prescription medicines only as told by your doctor. Contact a doctor if: Treatment is not working. You have heart, liver, or kidney disease and have symptoms of edema. You have sudden and unexplained weight gain. Get help right away if: You have shortness of breath or chest pain. You cannot breathe when you lie down. You have pain, redness, or warmth in the swollen areas. You have heart, liver, or kidney disease and get edema all of a sudden. You have a fever and your symptoms get worse all of a sudden. Summary Edema is when you have too much fluid in your body or under your skin. Edema may make your legs, feet, and ankles swell up. Swelling is also common in looser tissues, like around your eyes. Raise (elevate) the swollen body part above the level of your heart when you are sitting or lying down. Follow your doctor's instructions about diet and how much fluid you can drink (fluid restriction). This information is not intended to replace advice given to you by your health care provider. Make sure you discuss any questions you have with your health care provider. Document Revised: 12/10/2017 Document Reviewed: 12/25/2016 Elsevier Patient Education  [place].  Urinary Incontinence  Urinary incontinence refers to a condition in which a person is unable to control where and when to pass urine. A person with this condition will urinate when he or she does not mean to (involuntarily). What are the causes? This condition may be caused by: Medicines. Infections. Constipation. Overactive bladder muscles. Weak bladder muscles. Weak pelvic floor muscles. These muscles provide support for the bladder, intestine, and, in women, the uterus. Enlarged prostate in men. The prostate is a gland near the bladder. When it gets too big, it can pinch the urethra. With the urethra blocked, the bladder can weaken and lose the  ability to empty properly. Surgery. Emotional factors, such as anxiety, stress, or post-traumatic stress disorder (PTSD). Pelvic organ prolapse. This happens in women when organs shift out of place and into the vagina. This shift can prevent the bladder and urethra from working properly. What increases the risk? The following factors may make you more likely to develop this condition: Older age. Obesity and physical inactivity. Pregnancy and childbirth. Menopause. Diseases that affect the nerves or spinal cord (neurological diseases). Long-term (chronic) coughing. This can increase pressure on the bladder and pelvic floor muscles. What are the signs or symptoms? Symptoms may vary depending on the type of urinary incontinence you have. They include: A sudden urge to urinate, but passing urine involuntarily before you can get to a bathroom (urge incontinence). Suddenly passing urine with any activity that forces urine to pass, such as coughing, laughing, exercise, or sneezing (stress incontinence). Needing to urinate  often, but urinating only a small amount, or constantly dribbling urine (overflow incontinence). Urinating because you cannot get to the bathroom in time due to a physical disability, such as arthritis or injury, or communication and thinking problems, such as Alzheimer disease (functional incontinence). How is this diagnosed? This condition may be diagnosed based on: Your medical history. A physical exam. Tests, such as: Urine tests. X-rays of your kidney and bladder. Ultrasound. CT scan. Cystoscopy. In this procedure, a health care provider inserts a tube with a light and camera (cystoscope) through the urethra and into the bladder in order to check for problems. Urodynamic testing. These tests assess how well the bladder, urethra, and sphincter can store and release urine. There are different types of urodynamic tests, and they vary depending on what the test is  measuring. To help diagnose your condition, your health care provider may recommend that you keep a log of when you urinate and how much you urinate. How is this treated? Treatment for this condition depends on the type of incontinence that you have and its cause. Treatment may include: Lifestyle changes, such as: Quitting smoking. Maintaining a healthy weight. Staying active. Try to get 150 minutes of moderate-intensity exercise every week. Ask your health care provider which activities are safe for you. Eating a healthy diet. Avoid high-fat foods, like fried foods. Avoid refined carbohydrates like white bread and white rice. Limit how much alcohol and caffeine you drink. Increase your fiber intake. Foods such as fresh fruits, vegetables, beans, and whole grains are healthy sources of fiber. Pelvic floor muscle exercises. Bladder training, such as lengthening the amount of time between bathroom breaks, or using the bathroom at regular intervals. Using techniques to suppress bladder urges. This can include distraction techniques or controlled breathing exercises. Medicines to relax the bladder muscles and prevent bladder spasms. Medicines to help slow or prevent the growth of a man's prostate. Botox injections. These can help relax the bladder muscles. Using pulses of electricity to help change bladder reflexes (electrical nerve stimulation). For women, using a medical device to prevent urine leaks. This is a small, tampon-like, disposable device that is inserted into the urethra. Injecting collagen or carbon beads (bulking agents) into the urinary sphincter. These can help thicken tissue and close the bladder opening. Surgery. Follow these instructions at home: Lifestyle Limit alcohol and caffeine. These can fill your bladder quickly and irritate it. Keep yourself clean to help prevent odors and skin damage. Ask your doctor about special skin creams and cleansers that can protect the skin  from urine. Consider wearing pads or adult diapers. Make sure to change them regularly, and always change them right after experiencing incontinence. General instructions Take over-the-counter and prescription medicines only as told by your health care provider. Use the bathroom about every 3-4 hours, even if you do not feel the need to urinate. Try to empty your bladder completely every time. After urinating, wait a minute. Then try to urinate again. Make sure you are in a relaxed position while urinating. If your incontinence is caused by nerve problems, keep a log of the medicines you take and the times you go to the bathroom. Keep all follow-up visits as told by your health care provider. This is important. Contact a health care provider if: You have pain that gets worse. Your incontinence gets worse. Get help right away if: You have a fever or chills. You are unable to urinate. You have redness in your groin area or down your legs.  Summary Urinary incontinence refers to a condition in which a person is unable to control where and when to pass urine. This condition may be caused by medicines, infection, weak bladder muscles, weak pelvic floor muscles, enlargement of the prostate (in men), or surgery. The following factors increase your risk for developing this condition: older age, obesity, pregnancy and childbirth, menopause, neurological diseases, and chronic coughing. There are several types of urinary incontinence. They include urge incontinence, stress incontinence, overflow incontinence, and functional incontinence. This condition is usually treated first with lifestyle and behavioral changes, such as quitting smoking, eating a healthier diet, and doing regular pelvic floor exercises. Other treatment options include medicines, bulking agents, medical devices, electrical nerve stimulation, or surgery. This information is not intended to replace advice given to you by your health care  provider. Make sure you discuss any questions you have with your health care provider. Document Revised: 12/17/2017 Document Reviewed: 03/18/2017 Elsevier Patient Education  [place].

## 2020-05-09 NOTE — Assessment & Plan Note (Signed)
Concerning patient's bilateral lower extremity edema, +2 pitting with right leg greater than left, patient started on Lasix 20 mg 1 pill p.o. daily.  Patient recommendation; elevate feet while sitting down, compression socks, education provided to patient with written handouts available.  Prescription sent to pharmacy.  Patient to follow-up with Dr. Lajuana Ripple in 2 to 3 weeks.

## 2020-05-09 NOTE — Assessment & Plan Note (Addendum)
Patient is well managed on current regimen.  No changes to current medication.  Labs collected today results pending.  Education provided to patient with printed handout.  Follow-up in 2 weeks with Dr. Lajuana Ripple.

## 2020-05-10 LAB — CMP14+EGFR
ALT: 14 IU/L (ref 0–44)
AST: 19 IU/L (ref 0–40)
Albumin/Globulin Ratio: 1.6 (ref 1.2–2.2)
Albumin: 4.2 g/dL (ref 3.5–4.6)
Alkaline Phosphatase: 92 IU/L (ref 48–121)
BUN/Creatinine Ratio: 12 (ref 10–24)
BUN: 12 mg/dL (ref 10–36)
Bilirubin Total: 0.4 mg/dL (ref 0.0–1.2)
CO2: 22 mmol/L (ref 20–29)
Calcium: 9.3 mg/dL (ref 8.6–10.2)
Chloride: 102 mmol/L (ref 96–106)
Creatinine, Ser: 1.02 mg/dL (ref 0.76–1.27)
GFR calc Af Amer: 72 mL/min/{1.73_m2} (ref >59)
GFR calc non Af Amer: 63 mL/min/{1.73_m2} (ref >59)
Globulin, Total: 2.6 g/dL (ref 1.5–4.5)
Glucose: 100 mg/dL — ABNORMAL HIGH (ref 65–99)
Potassium: 4.4 mmol/L (ref 3.5–5.2)
Sodium: 141 mmol/L (ref 134–144)
Total Protein: 6.8 g/dL (ref 6.0–8.5)

## 2020-05-10 LAB — VITAMIN D 25 HYDROXY (VIT D DEFICIENCY, FRACTURES): Vit D, 25-Hydroxy: 35.3 ng/mL (ref 30.0–100.0)

## 2020-05-10 LAB — CBC WITH DIFFERENTIAL/PLATELET
Basophils Absolute: 0.1 10*3/uL (ref 0.0–0.2)
Basos: 1 %
EOS (ABSOLUTE): 0.1 10*3/uL (ref 0.0–0.4)
Eos: 2 %
Hematocrit: 44.5 % (ref 37.5–51.0)
Hemoglobin: 14.7 g/dL (ref 13.0–17.7)
Immature Grans (Abs): 0.1 10*3/uL (ref 0.0–0.1)
Immature Granulocytes: 1 %
Lymphocytes Absolute: 1.6 10*3/uL (ref 0.7–3.1)
Lymphs: 18 %
MCH: 31.5 pg (ref 26.6–33.0)
MCHC: 33 g/dL (ref 31.5–35.7)
MCV: 95 fL (ref 79–97)
Monocytes Absolute: 0.9 10*3/uL (ref 0.1–0.9)
Monocytes: 10 %
Neutrophils Absolute: 6.2 10*3/uL (ref 1.4–7.0)
Neutrophils: 68 %
Platelets: 282 10*3/uL (ref 150–450)
RBC: 4.67 x10E6/uL (ref 4.14–5.80)
RDW: 12.8 % (ref 11.6–15.4)
WBC: 8.9 10*3/uL (ref 3.4–10.8)

## 2020-05-10 LAB — LIPID PANEL
Chol/HDL Ratio: 3.8 ratio (ref 0.0–5.0)
Cholesterol, Total: 159 mg/dL (ref 100–199)
HDL: 42 mg/dL (ref >39)
LDL Chol Calc (NIH): 93 mg/dL (ref 0–99)
Triglycerides: 135 mg/dL (ref 0–149)
VLDL Cholesterol Cal: 24 mg/dL (ref 5–40)

## 2020-05-10 LAB — PSA, TOTAL AND FREE
PSA, Free Pct: 20 %
PSA, Free: 0.04 ng/mL
Prostate Specific Ag, Serum: 0.2 ng/mL (ref 0.0–4.0)

## 2020-05-10 LAB — SPECIMEN STATUS REPORT

## 2020-05-24 ENCOUNTER — Ambulatory Visit (INDEPENDENT_AMBULATORY_CARE_PROVIDER_SITE_OTHER): Payer: Medicare Other | Admitting: Licensed Clinical Social Worker

## 2020-05-24 DIAGNOSIS — E7800 Pure hypercholesterolemia, unspecified: Secondary | ICD-10-CM | POA: Diagnosis not present

## 2020-05-24 DIAGNOSIS — K219 Gastro-esophageal reflux disease without esophagitis: Secondary | ICD-10-CM

## 2020-05-24 DIAGNOSIS — I10 Essential (primary) hypertension: Secondary | ICD-10-CM | POA: Diagnosis not present

## 2020-05-24 DIAGNOSIS — C61 Malignant neoplasm of prostate: Secondary | ICD-10-CM

## 2020-05-24 NOTE — Chronic Care Management (AMB) (Signed)
  Chronic Care Management    Clinical Social Work Follow Up Note  05/24/2020 Name: RICHAR DUNKLEE MRN: [id number] DOB: 1923/08/20  Myrtie Neither is a 86 y.o. year old male who is a primary care patient of [name], DO. The CCM team was consulted for assistance with [employer] .   Review of patient status, including review of consultants reports, other relevant assessments, and collaboration with appropriate care team members and the patient's provider was performed as part of comprehensive patient evaluation and provision of chronic care management services.    SDOH (Social Determinants of Health) assessments performed: Yes;risk for tobacco use; risk for depression  SDOH Interventions     Most Recent Value  SDOH Interventions  Depression Interventions/Treatment   -- [talked with Laverta Baltimore about LCSW support and about RNCM support]        Chronic Care Management from 05/24/2020 in [place]  PHQ-9 Total Score  3      GAD 7 : Generalized Anxiety Score 05/24/2020  Nervous, Anxious, on Edge 1  Control/stop worrying 1  Worry too much - different things 0  Trouble relaxing 0  Restless 0  Easily annoyed or irritable 0  Afraid - awful might happen 0  Total GAD 7 Score 2  Anxiety Difficulty Somewhat difficult    Outpatient Encounter Medications as of 05/24/2020  Medication Sig  . furosemide (LASIX) 20 MG tablet Take 1 tablet (20 mg total) by mouth daily.  Marland Kitchen lovastatin (MEVACOR) 20 MG tablet TAKE ONE TABLET BY MOUTH EVERY DAY  . Multiple Vitamin (MULTIVITAMIN WITH MINERALS) TABS tablet Take 1 tablet by mouth daily.  . tamsulosin (FLOMAX) 0.4 MG CAPS capsule Take 1 capsule (0.4 mg total) by mouth daily.   No facility-administered encounter medications on file as of 05/24/2020.    Goals Addressed            This Visit's Progress   . Client will talk with LCSW in next 30 days to discuss community resources of assistance for client  (pt-stated)       CARE PLAN Entry    Current Barriers:  Marland Kitchen Mobility issues (uses walker to ambulate) . Some vision challenges in client with chronic diagnoses of Depression, HLD, GERD, HTN , Prostate Cancer  Clinical Social Work Clinical Goal(s):  Marland Kitchen LCSW will call client in next 30 days to discuss community resources of help to client  Interventions: . Talked with Laverta Baltimore, spouse of client ,about CCM program . Talked with Terrence Dupont about relaxation techniques of client (likes to watch TV, likes to read, enjoys golf programs) . Talked with Terrence Dupont about upcoming client appointments . Talked with Terrence Dupont about transport needs of client . Talked with Terrence Dupont about activities at facility (she and client reside at [facility] in [place], [place]) . Talked with Terrence Dupont about sleeping issues of client . Talked with Terrence Dupont about energy level of client . Talked with Terrence Dupont about medication procurement for client . Talked with Terrence Dupont about social support network for client  Patient Self Care Activities:  Eats meals with set up assistance Attends medical appointments as scheduled  Patient Self Care Deficits:   Mobility challenges Some vision challenges   Initial goal documentation        Follow Up Plan: LCSW to call client/spouse in next 4 weeks to talk with client /spouse about community resources of help to client  Norva Riffle.Ladaisha Portillo MSW, LCSW Licensed Clinical Social Worker [facility] [phone]

## 2020-05-24 NOTE — Patient Instructions (Addendum)
Licensed Clinical Social Worker Visit Information  Goals we discussed today:  Goals Addressed            This Visit's Progress   . Client will talk with LCSW in next 30 days to discuss community resources of assistance for client (pt-stated)       CARE PLAN Entry    Current Barriers:  Marland Kitchen Mobility issues (uses walker to ambulate) . Some vision challenges in client with chronic diagnoses of Depression, HLD, GERD, HTN , Prostate Cancer  Clinical Social Work Clinical Goal(s):  Marland Kitchen LCSW will call client in next 30 days to discuss community resources of help to client  Interventions: . Talked with Laverta Baltimore, spouse of client ,about CCM program . Talked with Terrence Dupont about relaxation techniques of client (likes to watch TV, likes to read, enjoys golf programs) . Talked with Terrence Dupont about upcoming client appointments . Talked with Terrence Dupont about transport needs of client . Talked with Terrence Dupont about activities at facility (she and client reside at [facility] in [place], [place]) . Talked with Terrence Dupont about sleeping issues of client . Talked with Terrence Dupont about energy level of client . Talked with Terrence Dupont about medication procurement for client . Talked with Terrence Dupont about social support network for client  Patient Self Care Activities:  Eats meals with set up assistance Attends medical appointments as scheduled  Patient Self Care Deficits:   Mobility challenges Some vision challenges   Initial goal documentation        Materials Provided: No  Follow Up Plan: LCSW to call client/spouse in next 4 weeks to talk with client /spouse about community resources of help to client  The patient/[name], spouse, verbalized understanding of instructions provided today and declined a print copy of patient instruction materials.   Norva Riffle.Alezandra Egli MSW, LCSW Licensed Clinical Social Worker [facility] [phone]

## 2020-06-04 ENCOUNTER — Ambulatory Visit (INDEPENDENT_AMBULATORY_CARE_PROVIDER_SITE_OTHER): Payer: Medicare Other | Admitting: Family Medicine

## 2020-06-04 ENCOUNTER — Encounter: Payer: Self-pay | Admitting: Family Medicine

## 2020-06-04 ENCOUNTER — Other Ambulatory Visit: Payer: Self-pay

## 2020-06-04 VITALS — BP 140/84 | HR 89 | Temp 97.6°F | Ht 72.0 in | Wt 217.0 lb

## 2020-06-04 DIAGNOSIS — N3945 Continuous leakage: Secondary | ICD-10-CM

## 2020-06-04 DIAGNOSIS — L859 Epidermal thickening, unspecified: Secondary | ICD-10-CM

## 2020-06-04 DIAGNOSIS — R600 Localized edema: Secondary | ICD-10-CM

## 2020-06-04 DIAGNOSIS — Z8546 Personal history of malignant neoplasm of prostate: Secondary | ICD-10-CM | POA: Diagnosis not present

## 2020-06-04 MED ORDER — FUROSEMIDE 20 MG PO TABS: 20.0000 mg | ORAL_TABLET | Freq: Every day | ORAL | 0 refills | Status: DC | PRN

## 2020-06-04 NOTE — Progress Notes (Signed)
Subjective: CC: Follow-up edema, urinary incontinence PCP: [name], DO LNL:[id number] [name] is a 86 y.o. male presenting to clinic today for:  1.  Bilateral lower extremity edema Patient was seen about 3 weeks ago for bilateral lower extremity edema.  He was started on Lasix 20 mg daily.  He reports good urine output with this but lower extremities continued to be edematous.  He does not use compression hose nor does he leg elevate at home.  He eats various degrees of salt pending on what is preprepared for him at the facility.  Denies any change in exercise tolerance, orthopnea, shortness of breath or chest pain.  2.  Urinary incontinence Patient with history of prostate cancer status post radiation therapy.  He was empirically started on Flomax in efforts to see if perhaps this would improve his urinary incontinence.  Ever, he continues to have quite a bit of urinary incontinence and depends on adult diapers.  3.  Skin lesion Patient was seen again about 3 weeks ago for a lesion on the apex of his head.  This lesion has been present for many years but has been gradually getting larger.  He denies any spontaneous bleeding but does note irritation at the scalp.  ROS: Per HPI  No Known Allergies Past Medical History:  Diagnosis Date  . BPH (benign prostatic hyperplasia)   . Cataract   . GERD (gastroesophageal reflux disease)   . Hyperlipidemia   . Hypertension   . PROSTATE CANCER 01/19/2011   Qualifier: Diagnosis of  By: Nils Pyle CMA ([place]), [name]    . Vitamin D deficiency     Current Outpatient Medications:  .  furosemide (LASIX) 20 MG tablet, Take 1 tablet (20 mg total) by mouth daily., Disp: 14 tablet, Rfl: 0 .  lovastatin (MEVACOR) 20 MG tablet, TAKE ONE TABLET BY MOUTH EVERY DAY, Disp: 90 tablet, Rfl: 1 .  Multiple Vitamin (MULTIVITAMIN WITH MINERALS) TABS tablet, Take 1 tablet by mouth daily., Disp: , Rfl:  .  tamsulosin (FLOMAX) 0.4 MG CAPS capsule, Take 1 capsule  (0.4 mg total) by mouth daily., Disp: 30 capsule, Rfl: 0 Social History   Socioeconomic History  . Marital status: Married    Spouse name: emma  . Number of children: 2  . Years of education: Not on file  . Highest education level: Not on file  Occupational History  . 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 Smoking status: Former Smoker    Quit date: 01/25/1950    Years since quitting: 70.4  . Smokeless tobacco: Never Used  Vaping Use  . Vaping Use: Never used  Substance and Sexual Activity  . Alcohol use: No  . Drug use: No  . Sexual activity: Never  Other Topics Concern  . Not on file  Social History Narrative  . Not on file   Social Determinants of Health   Financial Resource Strain:   . Difficulty of Paying Living Expenses:   Food Insecurity:   . Worried About Charity fundraiser in the Last Year:   . Arboriculturist in the Last Year:   Transportation Needs:   . Film/video editor (Medical):   Marland Kitchen Lack of Transportation (Non-Medical):   Physical Activity:   . Days of Exercise per Week:   . Minutes of Exercise per Session:   Stress:   . Feeling of Stress :   Social Connections:   .  Frequency of Communication with Friends and Family:   . Frequency of Social Gatherings with Friends and Family:   . Attends Religious Services:   . Active Member of Clubs or Organizations:   . Attends Archivist Meetings:   Marland Kitchen Marital Status:   Intimate Partner Violence:   . Fear of Current or Ex-Partner:   . Emotionally Abused:   Marland Kitchen Physically Abused:   . Sexually Abused:    Family History  Problem Relation Age of Onset  . Suicidality Father   . Alzheimer's disease Brother   . Other Brother        polio  . Arthritis Son        shoulders   . Colon cancer Neg Hx   . Stomach cancer Neg Hx   . Pancreatic cancer Neg Hx     Objective: Office vital signs reviewed. BP 140/84   Pulse 89   Temp  97.6 F (36.4 C) (Temporal)   Ht 6' (1.829 m)   Wt 217 lb (98.4 kg)   SpO2 97%   BMI 29.43 kg/m   Physical Examination:  General: Awake, alert, well nourished, elderly male. No acute distress HEENT: Normal; no JVD Cardio: regular rate and rhythm, S1S2 heard, no murmurs appreciated Pulm: clear to auscultation bilaterally, no wheezes, rhonchi or rales; normal work of breathing on room air Extremities: warm, well perfused. 1-2 pitting edema to lower leg, No cyanosis or clubbing; +2 pulses bilaterally MSK: slow gait and hunched station Skin: Approximately 1 cm x 3 cm irregularly shaped, yellowish hyperkeratotic lesion noted at the apex of the head concerning for squamous cell carcinoma.  No active bleeding or ulceration noted.  Assessment/ Plan: 86 y.o. male   1. Skin hyperkeratosis Very suspicious for squamous cell carcinoma.  I agree with referral to dermatology and have asked that referrals check in on this for him as he certainly needs to have this taken off.  2. Bilateral lower extremity edema Advised for compression hose, leg elevation and salt restriction.  He can use the Lasix as needed if needed for severe edema but I would avoid using this regularly as it can cause electrolyte imbalance.  Check BMP to check potassium today - Compression stockings - furosemide (LASIX) 20 MG tablet; Take 1 tablet (20 mg total) by mouth daily as needed for edema.  Dispense: 30 tablet; Refill: 0 - Basic Metabolic Panel  3. Continuous leakage of urine Likely secondary to history of prostate cancer and treatment for the prostate cancer.  I advised against the Flomax since it does not seem to be helping.  Continue to use adult diapers.  Hopefully reducing the frequency of Lasix as above will also help with the urinary symptoms  4. History of prostate cancer   No orders of the defined types were placed in this encounter.  No orders of the defined types were placed in this encounter.   I like to  see him back in 3 months to recheck these legs Maxon Kresse Windell Moulding, [place] [phone]

## 2020-06-05 LAB — BASIC METABOLIC PANEL
BUN/Creatinine Ratio: 10 (ref 10–24)
BUN: 12 mg/dL (ref 10–36)
CO2: 22 mmol/L (ref 20–29)
Calcium: 8.9 mg/dL (ref 8.6–10.2)
Chloride: 102 mmol/L (ref 96–106)
Creatinine, Ser: 1.16 mg/dL (ref 0.76–1.27)
GFR calc Af Amer: 62 mL/min/{1.73_m2} (ref >59)
GFR calc non Af Amer: 54 mL/min/{1.73_m2} — ABNORMAL LOW (ref >59)
Glucose: 111 mg/dL — ABNORMAL HIGH (ref 65–99)
Potassium: 4.5 mmol/L (ref 3.5–5.2)
Sodium: 139 mmol/L (ref 134–144)

## 2020-06-27 ENCOUNTER — Telehealth: Payer: Self-pay

## 2020-07-30 ENCOUNTER — Telehealth: Payer: Medicare Other

## 2020-08-05 ENCOUNTER — Ambulatory Visit: Payer: Medicare Other | Admitting: Podiatry

## 2020-08-14 ENCOUNTER — Telehealth: Payer: Self-pay | Admitting: Family Medicine

## 2020-08-14 NOTE — Telephone Encounter (Signed)
Yes please give verbal for CMP, CBC, Urinalysis with culture

## 2020-08-14 NOTE — Telephone Encounter (Signed)
Pts daughter states father was vaccinated for covid and is now sick with covid. He is not eating or drinking well, and covid is making his dementia worse. [facility], the facility he is at, would like to see if the doctor can give them an order to draw a CBC and CMP to check for other infections to try and keep him out of the ER. Please advise.

## 2020-08-14 NOTE — Telephone Encounter (Signed)
Left message for Stanton Kidney with [facility] to call back about verbal order for patient.

## 2020-08-14 NOTE — Telephone Encounter (Signed)
Rx given to Dr. Synthia Innocent nurse for her to have her sign and fax.

## 2020-08-14 NOTE — Telephone Encounter (Signed)
Faxed as requested

## 2020-08-14 NOTE — Telephone Encounter (Signed)
Mary from [facility] returned missed call from [facility] regarding verbal lab order for patient. Stanton Kidney said that because they don't have an "in house" lab, they need an actual order faxed to them. Can fax order to [phone] Attn: Carmelina Paddock

## 2020-08-15 ENCOUNTER — Emergency Department (HOSPITAL_COMMUNITY): Payer: Medicare Other

## 2020-08-15 ENCOUNTER — Inpatient Hospital Stay (HOSPITAL_COMMUNITY)
Admission: EM | Admit: 2020-08-15 | Discharge: 2020-08-22 | DRG: 193 | Disposition: A | Discharge status: Skilled Nursing Facility | Payer: Medicare Other | Source: Skilled Nursing Facility | Attending: Internal Medicine | Admitting: Internal Medicine

## 2020-08-15 ENCOUNTER — Encounter (HOSPITAL_COMMUNITY): Payer: Self-pay | Admitting: Student

## 2020-08-15 ENCOUNTER — Other Ambulatory Visit: Payer: Self-pay

## 2020-08-15 DIAGNOSIS — Z8616 Personal history of COVID-19: Secondary | ICD-10-CM

## 2020-08-15 DIAGNOSIS — K219 Gastro-esophageal reflux disease without esophagitis: Secondary | ICD-10-CM | POA: Diagnosis present

## 2020-08-15 DIAGNOSIS — Z87891 Personal history of nicotine dependence: Secondary | ICD-10-CM | POA: Diagnosis not present

## 2020-08-15 DIAGNOSIS — I10 Essential (primary) hypertension: Secondary | ICD-10-CM | POA: Diagnosis present

## 2020-08-15 DIAGNOSIS — R109 Unspecified abdominal pain: Secondary | ICD-10-CM | POA: Diagnosis not present

## 2020-08-15 DIAGNOSIS — Z820 Family history of epilepsy and other diseases of the nervous system: Secondary | ICD-10-CM | POA: Diagnosis not present

## 2020-08-15 DIAGNOSIS — G20 Parkinson's disease: Secondary | ICD-10-CM | POA: Diagnosis not present

## 2020-08-15 DIAGNOSIS — Z789 Other specified health status: Secondary | ICD-10-CM | POA: Diagnosis not present

## 2020-08-15 DIAGNOSIS — G4700 Insomnia, unspecified: Secondary | ICD-10-CM | POA: Diagnosis not present

## 2020-08-15 DIAGNOSIS — R627 Adult failure to thrive: Secondary | ICD-10-CM | POA: Diagnosis present

## 2020-08-15 DIAGNOSIS — L89312 Pressure ulcer of right buttock, stage 2: Secondary | ICD-10-CM | POA: Diagnosis present

## 2020-08-15 DIAGNOSIS — I4510 Unspecified right bundle-branch block: Secondary | ICD-10-CM | POA: Diagnosis present

## 2020-08-15 DIAGNOSIS — Z8546 Personal history of malignant neoplasm of prostate: Secondary | ICD-10-CM

## 2020-08-15 DIAGNOSIS — E43 Unspecified severe protein-calorie malnutrition: Secondary | ICD-10-CM | POA: Diagnosis present

## 2020-08-15 DIAGNOSIS — F0281 Dementia in other diseases classified elsewhere with behavioral disturbance: Secondary | ICD-10-CM

## 2020-08-15 DIAGNOSIS — A419 Sepsis, unspecified organism: Secondary | ICD-10-CM | POA: Diagnosis not present

## 2020-08-15 DIAGNOSIS — R64 Cachexia: Secondary | ICD-10-CM | POA: Diagnosis present

## 2020-08-15 DIAGNOSIS — L8990 Pressure ulcer of unspecified site, unspecified stage: Secondary | ICD-10-CM | POA: Insufficient documentation

## 2020-08-15 DIAGNOSIS — Z515 Encounter for palliative care: Secondary | ICD-10-CM | POA: Diagnosis not present

## 2020-08-15 DIAGNOSIS — Z66 Do not resuscitate: Secondary | ICD-10-CM | POA: Diagnosis not present

## 2020-08-15 DIAGNOSIS — U071 COVID-19: Secondary | ICD-10-CM | POA: Diagnosis not present

## 2020-08-15 DIAGNOSIS — E785 Hyperlipidemia, unspecified: Secondary | ICD-10-CM | POA: Diagnosis present

## 2020-08-15 DIAGNOSIS — K5790 Diverticulosis of intestine, part unspecified, without perforation or abscess without bleeding: Secondary | ICD-10-CM | POA: Diagnosis present

## 2020-08-15 DIAGNOSIS — R6510 Systemic inflammatory response syndrome (SIRS) of non-infectious origin without acute organ dysfunction: Secondary | ICD-10-CM | POA: Diagnosis present

## 2020-08-15 DIAGNOSIS — L89321 Pressure ulcer of left buttock, stage 1: Secondary | ICD-10-CM | POA: Diagnosis present

## 2020-08-15 DIAGNOSIS — F0391 Unspecified dementia with behavioral disturbance: Secondary | ICD-10-CM | POA: Diagnosis present

## 2020-08-15 DIAGNOSIS — J189 Pneumonia, unspecified organism: Principal | ICD-10-CM | POA: Diagnosis present

## 2020-08-15 DIAGNOSIS — R531 Weakness: Secondary | ICD-10-CM | POA: Diagnosis present

## 2020-08-15 DIAGNOSIS — N400 Enlarged prostate without lower urinary tract symptoms: Secondary | ICD-10-CM | POA: Diagnosis present

## 2020-08-15 DIAGNOSIS — F03918 Unspecified dementia, unspecified severity, with other behavioral disturbance: Secondary | ICD-10-CM | POA: Diagnosis present

## 2020-08-15 DIAGNOSIS — I441 Atrioventricular block, second degree: Secondary | ICD-10-CM | POA: Diagnosis present

## 2020-08-15 DIAGNOSIS — Z6825 Body mass index (BMI) 25.0-25.9, adult: Secondary | ICD-10-CM | POA: Diagnosis not present

## 2020-08-15 DIAGNOSIS — Z7189 Other specified counseling: Secondary | ICD-10-CM | POA: Diagnosis not present

## 2020-08-15 LAB — COMPREHENSIVE METABOLIC PANEL
ALT: 29 U/L (ref 0–44)
AST: 26 U/L (ref 15–41)
Albumin: 3 g/dL — ABNORMAL LOW (ref 3.5–5.0)
Alkaline Phosphatase: 57 U/L (ref 38–126)
Anion gap: 9 (ref 5–15)
BUN: 16 mg/dL (ref 8–23)
CO2: 24 mmol/L (ref 22–32)
Calcium: 8.7 mg/dL — ABNORMAL LOW (ref 8.9–10.3)
Chloride: 108 mmol/L (ref 98–111)
Creatinine, Ser: 1.22 mg/dL (ref 0.61–1.24)
GFR calc Af Amer: 58 mL/min — ABNORMAL LOW (ref >60)
GFR calc non Af Amer: 50 mL/min — ABNORMAL LOW (ref >60)
Glucose, Bld: 162 mg/dL — ABNORMAL HIGH (ref 70–99)
Potassium: 4.1 mmol/L (ref 3.5–5.1)
Sodium: 141 mmol/L (ref 135–145)
Total Bilirubin: 1.1 mg/dL (ref 0.3–1.2)
Total Protein: 7.3 g/dL (ref 6.5–8.1)

## 2020-08-15 LAB — CBC WITH DIFFERENTIAL/PLATELET
Abs Immature Granulocytes: 0.18 10*3/uL — ABNORMAL HIGH (ref 0.00–0.07)
Basophils Absolute: 0.1 10*3/uL (ref 0.0–0.1)
Basophils Relative: 0 %
Eosinophils Absolute: 0 10*3/uL (ref 0.0–0.5)
Eosinophils Relative: 0 %
HCT: 43.6 % (ref 39.0–52.0)
Hemoglobin: 13.9 g/dL (ref 13.0–17.0)
Immature Granulocytes: 1 %
Lymphocytes Relative: 7 %
Lymphs Abs: 1.3 10*3/uL (ref 0.7–4.0)
MCH: 30.3 pg (ref 26.0–34.0)
MCHC: 31.9 g/dL (ref 30.0–36.0)
MCV: 95 fL (ref 80.0–100.0)
Monocytes Absolute: 1.9 10*3/uL — ABNORMAL HIGH (ref 0.1–1.0)
Monocytes Relative: 10 %
Neutro Abs: 15.5 10*3/uL — ABNORMAL HIGH (ref 1.7–7.7)
Neutrophils Relative %: 82 %
Platelets: 309 10*3/uL (ref 150–400)
RBC: 4.59 MIL/uL (ref 4.22–5.81)
RDW: 13 % (ref 11.5–15.5)
WBC: 19 10*3/uL — ABNORMAL HIGH (ref 4.0–10.5)
nRBC: 0 % (ref 0.0–0.2)

## 2020-08-15 LAB — URINALYSIS, ROUTINE W REFLEX MICROSCOPIC
Bilirubin Urine: NEGATIVE
Glucose, UA: NEGATIVE mg/dL
Hgb urine dipstick: NEGATIVE
Ketones, ur: NEGATIVE mg/dL
Leukocytes,Ua: NEGATIVE
Nitrite: NEGATIVE
Protein, ur: NEGATIVE mg/dL
Specific Gravity, Urine: 1.021 (ref 1.005–1.030)
pH: 5 (ref 5.0–8.0)

## 2020-08-15 LAB — LIPASE, BLOOD: Lipase: 24 U/L (ref 11–51)

## 2020-08-15 LAB — PROCALCITONIN: Procalcitonin: <0.1 ng/mL

## 2020-08-15 LAB — HIV ANTIBODY (ROUTINE TESTING W REFLEX): HIV Screen 4th Generation wRfx: NONREACTIVE

## 2020-08-15 LAB — LACTIC ACID, PLASMA
Lactic Acid, Venous: 1.6 mmol/L (ref 0.5–1.9)
Lactic Acid, Venous: 2 mmol/L (ref 0.5–1.9)
Lactic Acid, Venous: 1.1 mmol/L (ref 0.5–1.9)

## 2020-08-15 LAB — STREP PNEUMONIAE URINARY ANTIGEN: Strep Pneumo Urinary Antigen: NEGATIVE

## 2020-08-15 LAB — APTT: aPTT: 29 seconds (ref 24–36)

## 2020-08-15 LAB — PROTIME-INR
INR: 1.2 (ref 0.8–1.2)
Prothrombin Time: 14.8 seconds (ref 11.4–15.2)

## 2020-08-15 MED ORDER — IOHEXOL 300 MG/ML  SOLN
100.0000 mL | Freq: Once | INTRAMUSCULAR | Status: AC | PRN
  Administered 2020-08-15: 100 mL via INTRAVENOUS

## 2020-08-15 MED ORDER — SODIUM CHLORIDE 0.9 % IV SOLN: 2.0000 g | INTRAVENOUS | Status: DC

## 2020-08-15 MED ORDER — VANCOMYCIN HCL IN DEXTROSE 1-5 GM/200ML-% IV SOLN
1000.0000 mg | Freq: Once | INTRAVENOUS | Status: AC
  Administered 2020-08-15: 1000 mg via INTRAVENOUS
  Filled 2020-08-15: qty 200

## 2020-08-15 MED ORDER — SODIUM CHLORIDE 0.9 % IV SOLN
2.0000 g | Freq: Once | INTRAVENOUS | Status: AC
  Administered 2020-08-15: 2 g via INTRAVENOUS
  Filled 2020-08-15: qty 2

## 2020-08-15 MED ORDER — ENOXAPARIN SODIUM 40 MG/0.4ML ~~LOC~~ SOLN
40.0000 mg | SUBCUTANEOUS | Status: DC
  Administered 2020-08-15 – 2020-08-18 (×4): 40 mg via SUBCUTANEOUS
  Filled 2020-08-15 (×7): qty 0.4

## 2020-08-15 MED ORDER — SODIUM CHLORIDE 0.9 % IV SOLN
500.0000 mg | INTRAVENOUS | Status: DC
  Administered 2020-08-15 – 2020-08-17 (×3): 500 mg via INTRAVENOUS
  Filled 2020-08-15 (×4): qty 500

## 2020-08-15 MED ORDER — LACTATED RINGERS IV BOLUS
500.0000 mL | Freq: Once | INTRAVENOUS | Status: AC
  Administered 2020-08-15: 500 mL via INTRAVENOUS

## 2020-08-15 MED ORDER — SODIUM CHLORIDE 0.9 % IV SOLN
2.0000 g | INTRAVENOUS | Status: AC
  Administered 2020-08-15 – 2020-08-18 (×4): 2 g via INTRAVENOUS
  Filled 2020-08-15 (×4): qty 20

## 2020-08-15 MED ORDER — METRONIDAZOLE IN NACL 5-0.79 MG/ML-% IV SOLN
500.0000 mg | Freq: Once | INTRAVENOUS | Status: AC
  Administered 2020-08-15: 500 mg via INTRAVENOUS
  Filled 2020-08-15: qty 100

## 2020-08-15 MED ORDER — SODIUM CHLORIDE 0.9 % IV SOLN: 500.0000 mg | INTRAVENOUS | Status: DC

## 2020-08-15 NOTE — ED Notes (Signed)
Received bedside report from [facility]. Pt resting comfortably at this time with safety sitter at the bedside.

## 2020-08-15 NOTE — ED Provider Notes (Signed)
[facility] EMERGENCY DEPARTMENT Provider Note   CSN: [id number] Arrival date & time: 08/15/20  7035     History Chief Complaint  Patient presents with  . Weakness    Alec Davis is a 86 y.o. 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 Patient was diagnosed with COVID 19 3 weeks ago- no testing negative, however since diagnoses he has had general decline. He has been weaker with decreased appetite, his daughter relayed to staff that she had not seen him eat or drink in 3 days. History is primarily provided by EMS, patient mildly confused at baseline & hard of hearing. He denies pain, vomiting, or trouble breathing. EMS States there was a smell of urine, they were concerned for UTI and activated a code sepsis. They also relay SpO2 was 87% on RA, applied 4L via [place] with improvement. Received 500 cc of fluid en route.  HPI     Past Medical History:  Diagnosis Date  . BPH (benign prostatic hyperplasia)   . Cataract   . GERD (gastroesophageal reflux disease)   . Hyperlipidemia   . Hypertension   . PROSTATE CANCER 01/19/2011   Qualifier: Diagnosis of  By: Nils Pyle CMA ([place]), [name]    . Vitamin D deficiency     Patient Active Problem List   Diagnosis Date Noted  . Continuous leakage of urine 05/09/2020  . Edema, lower extremity 05/09/2020  . Scalp lesion 05/09/2020  . 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 Heart block 12/18/2016  . Second degree AV block, Mobitz type I 12/17/2016  . Abdominal pain, epigastric 12/17/2016  . Mobitz type 1 second degree atrioventricular block   . Hyperlipemia 08/03/2013  . Depression 08/03/2013  . Vitamin D deficiency 03/07/2011  . GERD (gastroesophageal reflux  disease) 03/07/2011  . Prostate cancer ([place]) 01/19/2011  . Essential hypertension 01/19/2011  . Right bundle branch block 01/19/2011  . INTERNAL HEMORRHOIDS 01/19/2011  . DIVERTICULOSIS, COLON 01/19/2011  . COLONIC POLYPS, HX OF 01/19/2011    Past Surgical History:  Procedure Laterality Date  . cataracts  Bilateral    Dr [name]  . 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 SPINE SURGERY     in [place] age 15        Family History  Problem Relation Age of Onset  . Suicidality Father   . Alzheimer's disease Brother   . Other Brother        polio  . Arthritis Son        shoulders   . Colon cancer Neg Hx   . Stomach cancer Neg Hx   . Pancreatic cancer Neg Hx     Social History   Tobacco Use  . Smoking status: Former Smoker    Quit date: 01/25/1950    Years since quitting: 70.6  . Smokeless tobacco: Never Used  Vaping Use  . Vaping Use: Never used  Substance Use Topics  . Alcohol use: No  . Drug use: No    Home Medications Prior to Admission medications   Medication Sig Start Date End Date Taking? Authorizing Provider  furosemide (LASIX) 20 MG  tablet Take 1 tablet (20 mg total) by mouth daily as needed for edema. 06/04/20   Ronnie Doss M, DO  lovastatin (MEVACOR) 20 MG tablet TAKE ONE TABLET BY MOUTH EVERY DAY 12/18/19   Ronnie Doss M, DO  Multiple Vitamin (MULTIVITAMIN WITH MINERALS) TABS tablet Take 1 tablet by mouth daily.    [provider]    Allergies    Patient has no known allergies.  Review of Systems   Review of Systems  Constitutional: Positive for appetite change. Negative for fever.  Respiratory: Negative for cough and shortness of breath.   Cardiovascular: Negative for chest pain.  Gastrointestinal: Negative for abdominal pain and vomiting.  Neurological: Positive for weakness.  All other  systems reviewed and are negative.  Physical Exam Updated Vital Signs BP 123/74 (BP Location: Right Arm)   Pulse 90   Temp 99.2 F (37.3 C) (Rectal)   Resp (!) 22   Ht 5\' 10"  (1.778 m)   Wt 90.7 kg   SpO2 94%   BMI 28.70 kg/m   Physical Exam Vitals and nursing note reviewed.  Constitutional:      General: He is not in acute distress.    Appearance: He is well-developed. He is ill-appearing. He is not toxic-appearing.  HENT:     Head: Normocephalic and atraumatic.     Mouth/Throat:     Mouth: Mucous membranes are dry.  Eyes:     General:        Right eye: No discharge.        Left eye: No discharge.     Conjunctiva/sclera: Conjunctivae normal.  Cardiovascular:     Rate and Rhythm: Normal rate and regular rhythm.  Pulmonary:     Effort: Tachypnea present. No respiratory distress.     Breath sounds: No wheezing, rhonchi or rales.     Comments: Poor air movement.  Abdominal:     General: There is no distension.     Palpations: Abdomen is soft.     Tenderness: There is abdominal tenderness (RUQ & epigastrium). There is no guarding or rebound.  Musculoskeletal:     Cervical back: Neck supple.     Right lower leg: No edema.     Left lower leg: No edema.  Skin:    General: Skin is warm and dry.     Findings: No rash.  Neurological:     Mental Status: He is alert.     Comments: Clear speech.   Psychiatric:        Behavior: Behavior normal.    ED Results / Procedures / Treatments   Labs (all labs ordered are listed, but only abnormal results are displayed) Labs Reviewed  LACTIC ACID, PLASMA - Abnormal; Notable for the following components:      Result Value   Lactic Acid, Venous 2.0 (*)    All other components within normal limits  COMPREHENSIVE METABOLIC PANEL - Abnormal; Notable for the following components:   Glucose, Bld 162 (*)    Calcium 8.7 (*)    Albumin 3.0 (*)    GFR calc non Af Amer 50 (*)    GFR calc Af Amer 58 (*)    All other components within  normal limits  CBC WITH DIFFERENTIAL/PLATELET - Abnormal; Notable for the following components:   WBC 19.0 (*)    Neutro Abs 15.5 (*)    Monocytes Absolute 1.9 (*)    Abs Immature Granulocytes 0.18 (*)    All other components within normal limits  CULTURE, BLOOD (SINGLE)  URINE CULTURE  CULTURE, BLOOD (SINGLE)  LACTIC ACID, PLASMA  PROTIME-INR  APTT  URINALYSIS, ROUTINE W REFLEX MICROSCOPIC  LIPASE, BLOOD    EKG None  Radiology CT Abdomen Pelvis W Contrast  Result Date: 08/15/2020 CLINICAL DATA:  Epigastric pain, right upper quadrant pain and tenderness, history of gallstone ileus EXAM: CT ABDOMEN AND PELVIS WITH CONTRAST TECHNIQUE: Multidetector CT imaging of the abdomen and pelvis was performed using the standard protocol following bolus administration of intravenous contrast. CONTRAST:  1102mL OMNIPAQUE IOHEXOL 300 MG/ML  SOLN COMPARISON:  08/02/2019 FINDINGS: Lower chest: Heterogeneous airspace opacity and consolidation of the dependent bilateral lung bases. Three-vessel coronary artery calcifications. Mitral annulus calcifications. Aortic valve calcifications. Aortic atherosclerosis. Hepatobiliary: No solid liver abnormality is seen. Hepatic steatosis. Status post cholecystectomy. No biliary ductal dilatation Pancreas: Unremarkable. No pancreatic ductal dilatation or surrounding inflammatory changes. Spleen: Normal in size without significant abnormality. Adrenals/Urinary Tract: Stable, definitively benign left adrenal adenoma. Benign exophytic cyst of the posterior right kidney. Kidneys are normal, without renal calculi, solid lesion, or hydronephrosis. Tiny calculi in the dependent urinary bladder (series 3, image 96). Stomach/Bowel: Stomach is within normal limits. Appendix appears normal. No evidence of bowel wall thickening, distention, or inflammatory changes. Descending and sigmoid diverticulosis. Vascular/Lymphatic: No significant vascular findings are present. No enlarged  abdominal or pelvic lymph nodes. Reproductive: Prostatomegaly. Other: No abdominal wall hernia or abnormality. No abdominopelvic ascites. Musculoskeletal: No acute or significant osseous findings. IMPRESSION: 1. No acute CT findings within the abdomen or pelvis to explain epigastric pain. 2. Dependent bibasilar heterogeneous airspace opacity and consolidation, concerning for infection or aspiration. 3. Hepatic steatosis. 4. Descending and sigmoid diverticulosis without evidence of acute diverticulitis. 5. Tiny calculi in the dependent urinary bladder. No ureteral calculi. No hydronephrosis. 6. Prostatomegaly. 7. Coronary artery disease.  Aortic Atherosclerosis (ICD10-I70.0). Electronically Signed   By: Eddie Candle M.D.   On: 08/15/2020 13:27   DG Chest Port 1 View  Result Date: 08/15/2020 CLINICAL DATA:  Questionable sepsis. EXAM: PORTABLE CHEST 1 VIEW COMPARISON:  August 02, 2019 FINDINGS: Trachea is midline. Cardiomediastinal contours are stable. Lung volumes are low. No consolidation or pleural effusion. On limited assessment skeletal structures without acute process. Multiple leads overlie the chest also limiting assessment. IMPRESSION: Low lung volumes without acute cardiopulmonary process. Electronically Signed   By: Zetta Bills M.D.   On: 08/15/2020 09:29    Procedures .Critical Care Performed by: Amaryllis Dyke, PA-C Authorized by: Amaryllis Dyke, PA-C    CRITICAL CARE Performed by: Kennith Maes   Total critical care time: 35 minutes  Critical care time was exclusive of separately billable procedures and treating other patients.  Critical care was necessary to treat or prevent imminent or life-threatening deterioration.  Critical care was time spent personally by me on the following activities: development of treatment plan with patient and/or surrogate as well as nursing, discussions with consultants, evaluation of patient's response to treatment,  examination of patient, obtaining history from patient or surrogate, ordering and performing treatments and interventions, ordering and review of laboratory studies, ordering and review of radiographic studies, pulse oximetry and re-evaluation of patient's condition.    (including critical care time)  Medications Ordered in ED Medications  vancomycin (VANCOCIN) IVPB 1000 mg/200 mL premix (1,000 mg Intravenous New Bag/Given 08/15/20 1323)  lactated ringers bolus 500 mL (0 mLs Intravenous Stopped 08/15/20 1030)  ceFEPIme (MAXIPIME) 2 g in sodium chloride 0.9 % 100 mL IVPB (0 g Intravenous Stopped 08/15/20 1340)  metroNIDAZOLE (FLAGYL) IVPB 500 mg (500 mg Intravenous New Bag/Given 08/15/20 1151)  iohexol (OMNIPAQUE) 300 MG/ML solution 100 mL (100 mLs Intravenous Contrast Given 08/15/20 1249)    ED Course  I have reviewed the triage vital signs and the nursing notes.  Pertinent labs & imaging results that were available during my care of the patient were reviewed by me and considered in my medical decision making (see chart for details).    KALEP FULL was evaluated in Emergency Department on 08/15/2020 for the symptoms described in the history of present illness. He/she was evaluated in the context of the global COVID-19 pandemic, which necessitated consideration that the patient might be at risk for infection with the SARS-CoV-2 virus that causes COVID-19. Institutional protocols and algorithms that pertain to the evaluation of patients at risk for COVID-19 are in a state of rapid change based on information released by regulatory bodies including the CDC and federal and state organizations. These policies and algorithms were followed during the patient's care in the ED.  MDM Rules/Calculators/A&P                          Patient presents to the ED for evaluation of generalized weakness & poor PO intake.  On arrival SpO2 94% on RA, mild tachypnea noted. Rectal temp 99.7.  Exam notable for  RUQ/epigastric abdominal tenderness& dry MM. Received 500 ccs of fluid PTA, will give another 500 cc. Work-up initiated at this time.   Additional history obtained:  Additional history obtained from EMS. Previous records obtained and reviewed.   Lab Tests:  I Ordered, reviewed, and interpreted labs, which included:  CBC: Leukocytosis @ 19 with left shift. No Anemia.  CMP: Renal function & LFTs preserved. Hyperglycemia without acidosis or anion gap elevation.  Lipase: WNL APTT/PT/INR: WNL Lactic acid: WNL Urinalysis: no UTI Imaging Studies ordered:  I ordered imaging studies which included CXR & CT A/P, I independently visualized and interpreted imaging which showed no acute abdominal process however did show bibasilar airspace opacity & consolidation concerning for infection or aspiration.   ED Course:  10:18: Leukocytosis and tachypnea- concern for sepsis- start abx, can still hold off on 30 cc/kg bolus as patient is normotensive and is pending lactic acid at this time. --> lactic acid not > 4  CT w/ findings concerning for pneumonia/aspiration, patient has received abx, with his leukocytosis & tachypnea will consult for admission.   14:04: CONSULT: Discussed case with hospitalist Dr. Tamala Julian- accepts admission.   Findings and plan of care discussed with supervising physician Dr. Tyrone Nine who has evaluated the patient & is in agreement.   Portions of this note were generated with [employer]. Dictation errors may occur despite best attempts at proofreading.  Final Clinical Impression(s) / ED Diagnoses Final diagnoses:  Sepsis, due to unspecified organism, unspecified whether acute organ dysfunction present [facility])  Weakness    Rx / DC Orders ED Discharge Orders    None       Amaryllis Dyke, PA-C 08/15/20 [place], DO 08/15/20 1410

## 2020-08-15 NOTE — ED Notes (Signed)
Hand mits ordered by Emilie NS

## 2020-08-15 NOTE — ED Triage Notes (Signed)
Patient presents to ed via [facility] from [facility] [place], states patient was dx. With covid 3 weeks ago however tested neg Monday, c/o deccreased appetite and generalized weakness last several weeks decreased po intake last 3 days.

## 2020-08-15 NOTE — ED Notes (Signed)
Safety sitter at bedside 

## 2020-08-15 NOTE — Progress Notes (Signed)
Notified provider - Dr. Fuller Plan - of need to order repeat lactic acid (3rd) since 2nd LA increased from first despite fluid bolus.

## 2020-08-15 NOTE — Consult Note (Signed)
Consultation Note Date: 08/15/2020   Patient Name: Alec Davis  DOB: 02/03/1923  MRN: [id number]  Age / Sex: 86 y.o., male  PCP: [name], DO Referring Physician: Norval Morton, MD  Reason for Consultation: Establishing goals of care  HPI/Patient Profile: 86 y.o. male  with past medical history of HTN, hyperlipidemia, prostate cancer, BPH, and possible dementia. He presents to the ED on 08/15/2020 with complaints of weakness. Patient was in his usual state of health until he was diagnosed with COVID-19 infection approximately 3 weeks ago (he was fully vaccinated in March). Reportedly had a significant cough, and then stopped eating about 2 weeks ago. Yesterday, he slid off the bed and family was unable to get him up so called EMS.  In the ED, he is noted to be tachypneic. Labs are significant for WBC-19, lipase-24, and lactic acid 1.6->2. CT scan of the abdomen and pelvis noted bibasilar airspace opacity and consolidation concerning for possible infection versus aspiration. Sepsis protocol was initiated and he was treated with broad-spectrum antibiotics and IV fluid.  Palliative care has been consulted to assist with goals of care.   Clinical Assessment and Goals of Care: Primary decision maker: Daughter [name] [phone]) and son, they have joint HCPOA  I have reviewed medical records including EPIC notes, labs and imaging, and examined the patient in the ED. He is agitated, mittens on bilaterally, "grabbing" at the air. He is not oriented to self. Unable to answer any questions.   I called daughter [name] to discuss diagnosis, prognosis, GOC, disposition, and options.  I introduced Palliative Medicine as specialized medical care for people living with serious illness. It focuses on providing relief from the symptoms and stress of a serious illness.   We discussed a brief life review of the  patient. He is originally from a small town in [place], moved to [place] for work. 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 He and his wife have been married for 28 years. They have 1 daughter and 1 son.   As far as functional and nutritional status, patient and wife live in [facility] (independent living). At baseline, he ambulates with a walker. He is able to feed and dress himself. However, [name] reports a rapid decline over the past 2-3 weeks.   We discussed his current illness and what it means in the larger context of his ongoing co-morbidities. Natural disease trajectory of chronic illness was discussed. I provide education that COVID-19 infection can unfortunately exacerbate underlying chronic illness and/or dementia, especially in elderly patients. [name] states that she has previously been made aware of this by a family friend who is a Marine scientist.   [name] states that her mother and brother will need to be involved in any formal decision making. Her brother is traveling to the hospital tomorrow. We discuss a plan to meet sometime tomorrow.   SUMMARY OF RECOMMENDATIONS - full code full scope for now   - plan for full [place] meeting tomorrow with spouse,  daughter, and son (time TBD) - I have asked daughter to bring a copy of living will and HCPOA documents  Code Status/Advance Care Planning:  Full code  Symptom Management:   Per primary team  Palliative Prophylaxis:   Aspiration, Oral Care and Turn Reposition  Psycho-social/Spiritual:   Desire for further Chaplaincy support: not assessed  Prognosis:   Unable to determine at this point  Discharge Planning: To Be Determined      Primary Diagnoses: Present on Admission: . Sepsis due to pneumonia ([place]) . Dementia with behavioral disturbance ([place])   I have reviewed the medical record, interviewed the patient and family, and examined the patient. The following aspects are pertinent.  Past  Medical History:  Diagnosis Date  . BPH (benign prostatic hyperplasia)   . Cataract   . GERD (gastroesophageal reflux disease)   . Hyperlipidemia   . Hypertension   . PROSTATE CANCER 01/19/2011   Qualifier: Diagnosis of  By: Nils Pyle CMA ([place]), [name]    . Vitamin D deficiency    Social History   Socioeconomic History  . Marital status: Married    Spouse name: emma  . Number of children: 2  . Years of education: Not on file  . Highest education level: Not on file  Occupational History  . 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 Smoking status: Former Smoker    Quit date: 01/25/1950    Years since quitting: 70.6  . Smokeless tobacco: Never Used  Vaping Use  . Vaping Use: Never used  Substance and Sexual Activity  . Alcohol use: No  . Drug use: No  . Sexual activity: Never  Other Topics Concern  . Not on file  Social History Narrative  . Not on file    Family History  Problem Relation Age of Onset  . Suicidality Father   . Alzheimer's disease Brother   . Other Brother        polio  . Arthritis Son        shoulders   . Colon cancer Neg Hx   . Stomach cancer Neg Hx   . Pancreatic cancer Neg Hx    Scheduled Meds: . enoxaparin (LOVENOX) injection  40 mg Subcutaneous Q24H   Continuous Infusions: . azithromycin    . cefTRIAXone (ROCEPHIN)  IV     PRN Meds:. Medications Prior to Admission:  Prior to Admission medications   Medication Sig Start Date End Date Taking? Authorizing Provider  furosemide (LASIX) 20 MG tablet Take 1 tablet (20 mg total) by mouth daily as needed for edema. 06/04/20  Yes Gottschalk, Ashly M, DO  lovastatin (MEVACOR) 20 MG tablet TAKE ONE TABLET BY MOUTH EVERY DAY Patient not taking: Reported on 08/15/2020 12/18/19   [name], DO   No Known Allergies Review of Systems  Unable to perform ROS: Dementia    Physical Exam Vitals reviewed.  Constitutional:       General: He is not in acute distress.    Comments: Elderly white male  HENT:     Head: Normocephalic and atraumatic.  Cardiovascular:     Rate and Rhythm: Normal rate and regular rhythm.  Pulmonary:     Effort: Pulmonary effort is normal. Tachypnea present.  Psychiatric:        Behavior: Behavior is agitated.     Vital Signs: BP 131/75 (BP Location: Left Arm)   Pulse 86   Temp 99.2 F (37.3 C) (  Rectal)   Resp (!) 22   Ht 5\' 10"  (1.778 m)   Wt 90.7 kg   SpO2 95%   BMI 28.70 kg/m  Pain Scale: 0-10   Pain Score: 0-No pain   SpO2: SpO2: 95 % O2 Device:SpO2: 95 % O2 Flow Rate: .   IO: Intake/output summary: No intake or output data in the 24 hours ending 08/15/20 1959  LBM:   Baseline Weight: Weight: 90.7 kg Most recent weight: Weight: 90.7 kg      Palliative Assessment/Data: PPS 30%    Time In: 19:10 Time Out: 20:00 Time Total: 50 minutes Greater than 50%  of this time was spent counseling and coordinating care related to the above assessment and plan.  Signed by: Lavena Bullion, NP   Please contact Palliative Medicine Team phone at [phone] for questions and concerns.  For individual provider: See Shea Evans

## 2020-08-15 NOTE — ED Notes (Signed)
Spoke to Daughter [name]) [phone]

## 2020-08-15 NOTE — ED Notes (Signed)
Palliative care at bedside to assess pt.

## 2020-08-15 NOTE — ED Notes (Signed)
Pt given iced tea. Tolerating well. 1 mitten removed.

## 2020-08-15 NOTE — ED Notes (Signed)
Transported to xray for CT

## 2020-08-15 NOTE — H&P (Addendum)
History and Physical    JEREMIE GIANGRANDE XNA:[id number] DOB: [birth year] DOA: 08/15/2020  Referring Davis/NP/PA: Kennith Maes, PA-C PCP: [name], DO  Patient coming from: Nursing facility Via EMS  Chief Complaint: Weakness  I have personally briefly reviewed patient's old medical records in Alec Davis   HPI: Alec Davis is a 86 y.o. male with medical history significant of HTN, HLD, prostate cancer, and BPH presents with complaints of weakness. History is obtained from the patient's daughter who is present at bedside as he is lethargic and reported to likely have dementia.  At baseline patient lives with his wife who is also 61 years old at a independent living facility.  Patient normally had been getting around with use of a walker.  Patient had been diagnosed with COVID-19 infection approximately 3 weeks ago after receiving his Covid-19 vaccinations in March.  Reported that he had a significant cough when symptoms were started, but stopped eating approximately 2 weeks ago.  Family have been trying to get him to eat and drink, but they report that he is not been eating much.  Yesterday, he slid off the bed and they were unable to get him up.  He reportedly did not hit his head or lose consciousness.  EMS was called to bring him to the hospital.  He reportedly only has a mild intermittent cough now that is dry.  His daughter did note that he appears to have been complaining of stomach and side pain when they were trying to get him to eat.  Denied any reports of nausea or vomiting.  Reportedly patient was retested for COVID-19 3 days ago and tested negative.  Daughter notes that she has to go home to check to see if patient is to remain a full code or has a DO NOT RESUSCITATE order in place.  As far as medications goes patient is only on Lasix as needed for swelling.  ED Course: Upon admission into the emergency department patient was seen to be afebrile, but was noted to be  mildly tachypneic.  Labs significant for WBC 19, lipase 24, and lactic acid 1.6->2.  CT scan of the abdomen and pelvis noted bibasilar heterogeneous airspace opacity and consolidation concerning for possible infection versus aspiration.  Sepsis protocol has been initiated and patient has been started on broad-spectrum antibiotics with vancomycin, metronidazole, and cefepime.  He also have been given 500 ml of lactated Ringer's.  Review of Systems  Constitutional: Negative for fever and malaise/fatigue.  HENT: Negative for ear discharge and nosebleeds.   Eyes: Negative for photophobia and pain.  Respiratory: Positive for cough and shortness of breath. Negative for sputum production.   Cardiovascular: Negative for chest pain and leg swelling.  Gastrointestinal: Positive for abdominal pain. Negative for diarrhea and vomiting.  Genitourinary: Negative for dysuria and hematuria.  Musculoskeletal: Positive for falls. Negative for joint pain and myalgias.  Skin: Negative for rash.  Neurological: Positive for weakness. Negative for loss of consciousness.  Psychiatric/Behavioral: Positive for memory loss. Negative for substance abuse. The patient does not have insomnia.     Past Medical History:  Diagnosis Date  . BPH (benign prostatic hyperplasia)   . Cataract   . GERD (gastroesophageal reflux disease)   . Hyperlipidemia   . Hypertension   . PROSTATE CANCER 01/19/2011   Qualifier: Diagnosis of  By: Nils Pyle CMA ([place]), [name]    . Vitamin D deficiency     Past Surgical History:  Procedure Laterality Date  .  cataracts  Bilateral    Dr [name]  . LAPAROTOMY N/A 04/03/2017   Procedure: EXPLORATORY LAPAROTOMY AND RESECTION OF GALLSTONE ILEUS.;  Surgeon: Alec Davis;  Location: [facility];  Service: General;  Laterality: N/A;  . [place]     in [place] age 53      reports that he quit smoking about 70 years ago. He has never used smokeless tobacco. He reports that he does not drink alcohol  and does not use drugs.  No Known Allergies  Family History  Problem Relation Age of Onset  . Suicidality Father   . Alzheimer's disease Brother   . Other Brother        polio  . Arthritis Son        shoulders   . Colon cancer Neg Hx   . Stomach cancer Neg Hx   . Pancreatic cancer Neg Hx     Prior to Admission medications   Medication Sig Start Date End Date Taking? Authorizing Provider  furosemide (LASIX) 20 MG tablet Take 1 tablet (20 mg total) by mouth daily as needed for edema. 06/04/20  Yes Gottschalk, Ashly M, DO  lovastatin (MEVACOR) 20 MG tablet TAKE ONE TABLET BY MOUTH EVERY DAY Patient not taking: Reported on 08/15/2020 12/18/19   [name], DO    Physical Exam:  Constitutional: Elderly male who is lethargic, but is able to be aroused and we will respond Vitals:   08/15/20 0906 08/15/20 0908 08/15/20 1156  BP: 123/74  131/75  Pulse: 90  86  Resp: (!) 22  (!) 22  Temp: 99.2 F (37.3 C)    TempSrc: Rectal    SpO2: 94%  95%  Weight:  90.7 kg   Height:  5\' 10"  (1.778 m)    Eyes: PERRL, lids and conjunctivae normal ENMT: Mucous membranes are dry.  Posterior pharynx clear of any exudate or lesions.  Neck: normal, supple, no masses, no thyromegaly Respiratory: Mildly tachypneic with mild basilar crackles appreciated in lower lung fields. Cardiovascular: Regular rate and rhythm, no murmurs / rubs / gallops. No extremity edema. 2+ pedal pulses. No carotid bruits.  Abdomen: no tenderness, no masses palpated. No hepatosplenomegaly. Bowel sounds positive.  Musculoskeletal: no clubbing / cyanosis. No joint deformity upper and lower extremities. Good ROM, no contractures. Normal muscle tone.  Skin: Poor skin turgor. Neurologic: CN 2-12 grossly intact.  Appears able to move all extremities. Psychiatric: Lethargic.  Patient alert and oriented to self.   Labs on Admission: I have personally reviewed following labs and imaging studies  CBC: Recent Labs  Lab  08/15/20 0931  WBC 19.0*  NEUTROABS 15.5*  HGB 13.9  HCT 43.6  MCV 95.0  PLT 045   Basic Metabolic Panel: Recent Labs  Lab 08/15/20 0931  NA 141  K 4.1  CL 108  CO2 24  GLUCOSE 162*  BUN 16  CREATININE 1.22  CALCIUM 8.7*   GFR: Estimated Creatinine Clearance: 41 mL/min (by C-G formula based on SCr of 1.22 mg/dL). Liver Function Tests: Recent Labs  Lab 08/15/20 0931  AST 26  ALT 29  ALKPHOS 57  BILITOT 1.1  PROT 7.3  ALBUMIN 3.0*   Recent Labs  Lab 08/15/20 0931  LIPASE 24   No results for input(s): AMMONIA in the last 168 hours. Coagulation Profile: Recent Labs  Lab 08/15/20 0931  INR 1.2   Cardiac Enzymes: No results for input(s): CKTOTAL, CKMB, CKMBINDEX, TROPONINI in the last 168 hours. BNP (last 3 results) No  results for input(s): PROBNP in the last 8760 hours. HbA1C: No results for input(s): HGBA1C in the last 72 hours. CBG: No results for input(s): GLUCAP in the last 168 hours. Lipid Profile: No results for input(s): CHOL, HDL, LDLCALC, TRIG, CHOLHDL, LDLDIRECT in the last 72 hours. Thyroid Function Tests: No results for input(s): TSH, T4TOTAL, FREET4, T3FREE, THYROIDAB in the last 72 hours. Anemia Panel: No results for input(s): VITAMINB12, FOLATE, FERRITIN, TIBC, IRON, RETICCTPCT in the last 72 hours. Urine analysis:    Component Value Date/Time   COLORURINE YELLOW 08/15/2020 1055   APPEARANCEUR CLEAR 08/15/2020 1055   LABSPEC 1.021 08/15/2020 1055   PHURINE 5.0 08/15/2020 1055   GLUCOSEU NEGATIVE 08/15/2020 1055   HGBUR NEGATIVE 08/15/2020 1055   BILIRUBINUR NEGATIVE 08/15/2020 1055   BILIRUBINUR neg 01/22/2016 1600   [place] 08/15/2020 1055   PROTEINUR NEGATIVE 08/15/2020 1055   UROBILINOGEN negative 01/22/2016 1600   NITRITE NEGATIVE 08/15/2020 1055   LEUKOCYTESUR NEGATIVE 08/15/2020 1055   Sepsis Labs: No results found for this or any previous visit (from the past 240 hour(s)).   Radiological Exams on  Admission: CT Abdomen Pelvis W Contrast  Result Date: 08/15/2020 CLINICAL DATA:  Epigastric pain, right upper quadrant pain and tenderness, history of gallstone ileus EXAM: CT ABDOMEN AND PELVIS WITH CONTRAST TECHNIQUE: Multidetector CT imaging of the abdomen and pelvis was performed using the standard protocol following bolus administration of intravenous contrast. CONTRAST:  152mL OMNIPAQUE IOHEXOL 300 MG/ML  SOLN COMPARISON:  08/02/2019 FINDINGS: Lower chest: Heterogeneous airspace opacity and consolidation of the dependent bilateral lung bases. Three-vessel coronary artery calcifications. Mitral annulus calcifications. Aortic valve calcifications. Aortic atherosclerosis. Hepatobiliary: No solid liver abnormality is seen. Hepatic steatosis. Status post cholecystectomy. No biliary ductal dilatation Pancreas: Unremarkable. No pancreatic ductal dilatation or surrounding inflammatory changes. Spleen: Normal in size without significant abnormality. Adrenals/Urinary Tract: Stable, definitively benign left adrenal adenoma. Benign exophytic cyst of the posterior right kidney. Kidneys are normal, without renal calculi, solid lesion, or hydronephrosis. Tiny calculi in the dependent urinary bladder (series 3, image 96). Stomach/Bowel: Stomach is within normal limits. Appendix appears normal. No evidence of bowel wall thickening, distention, or inflammatory changes. Descending and sigmoid diverticulosis. Vascular/Lymphatic: No significant vascular findings are present. No enlarged abdominal or pelvic lymph nodes. Reproductive: Prostatomegaly. Other: No abdominal wall hernia or abnormality. No abdominopelvic ascites. Musculoskeletal: No acute or significant osseous findings. IMPRESSION: 1. No acute CT findings within the abdomen or pelvis to explain epigastric pain. 2. Dependent bibasilar heterogeneous airspace opacity and consolidation, concerning for infection or aspiration. 3. Hepatic steatosis. 4. Descending and  sigmoid diverticulosis without evidence of acute diverticulitis. 5. Tiny calculi in the dependent urinary bladder. No ureteral calculi. No hydronephrosis. 6. Prostatomegaly. 7. Coronary artery disease.  Aortic Atherosclerosis (ICD10-I70.0). Electronically Signed   By: Eddie Candle M.D.   On: 08/15/2020 13:27   DG Chest Port 1 View  Result Date: 08/15/2020 CLINICAL DATA:  Questionable sepsis. EXAM: PORTABLE CHEST 1 VIEW COMPARISON:  August 02, 2019 FINDINGS: Trachea is midline. Cardiomediastinal contours are stable. Lung volumes are low. No consolidation or pleural effusion. On limited assessment skeletal structures without acute process. Multiple leads overlie the chest also limiting assessment. IMPRESSION: Low lung volumes without acute cardiopulmonary process. Electronically Signed   By: Zetta Bills M.D.   On: 08/15/2020 09:29    EKG: Independently reviewed.  Sinus rhythm at 79 bpm with RBBB similar to previous tracing  Assessment/Plan Sepsis secondary to pneumonia: Acute.  Patient presented after being noted  to be more weak.  Recent diagnosis of COVID-19 3 weeks ago.  WBC elevated at 19 and he was noted to be mildly tachypneic meeting SIRS criteria.  CT scan of the abdomen and pelvis concerning for infection versus aspiration.  Patient had been treated initially with empiric antibiotics of vancomycin, cefepime, and metronidazole for sepsis due to unknown source. -Admit to MedSurg bed -Aspiration precautions  -Assistance with feeding -Elevate head of bed -Check procalcitonin -De-escalate antibiotics to Rocephin and azithromycin -Continue to trend lactic acid -Recheck CBC in a.m.  Generalized weakness/debility: At baseline patient ambulates with the use of a walker. -PT/OT consulted  Dementia: Patient does not have a formal diagnosis of dementia, but wife notes that he is confused that time.  In the emergency department patient was noted to be altered and intermittently tried to get out of  the bed. -Safety sitter at bedside  Recent COVID-19 infection: Patient and his wife had been diagnosed with COVID-19 approximately 3 weeks ago after receiving vaccinations in March.  He reportedly had negative test on 8/23 at the nursing facility.  Abdominal pain: Daughter had noted that the patient had complained of some abdominal pain and/or side pain.  Urinalysis did not show any significant signs of infection. CT scan of the abdomen and pelvis did not reveal any acute cause to the patient's symptoms either.  DVT prophylaxis: Lovenox Code Status: Full, but daughter supposed to check and verify if they have a DO NOT RESUSCITATE order in place. Family Communication: Daughter at bedside Disposition Plan: To be determined Consults called: None Admission status: Inpatient  Norval Morton Davis [facility] Pager [phone]   If 7PM-7AM, please contact night-coverage [email or web address] Password [email or web address]  08/15/2020, 2:17 PM

## 2020-08-15 NOTE — ED Notes (Signed)
Dinner ordered 

## 2020-08-15 NOTE — ED Notes (Signed)
Daughter at bedside.

## 2020-08-16 DIAGNOSIS — J189 Pneumonia, unspecified organism: Principal | ICD-10-CM

## 2020-08-16 DIAGNOSIS — F0391 Unspecified dementia with behavioral disturbance: Secondary | ICD-10-CM

## 2020-08-16 DIAGNOSIS — L8990 Pressure ulcer of unspecified site, unspecified stage: Secondary | ICD-10-CM | POA: Insufficient documentation

## 2020-08-16 DIAGNOSIS — Z66 Do not resuscitate: Secondary | ICD-10-CM

## 2020-08-16 DIAGNOSIS — U071 COVID-19: Secondary | ICD-10-CM

## 2020-08-16 DIAGNOSIS — Z789 Other specified health status: Secondary | ICD-10-CM

## 2020-08-16 DIAGNOSIS — Z7189 Other specified counseling: Secondary | ICD-10-CM

## 2020-08-16 DIAGNOSIS — A419 Sepsis, unspecified organism: Secondary | ICD-10-CM

## 2020-08-16 DIAGNOSIS — Z515 Encounter for palliative care: Secondary | ICD-10-CM

## 2020-08-16 LAB — COMPREHENSIVE METABOLIC PANEL
ALT: 31 U/L (ref 0–44)
AST: 30 U/L (ref 15–41)
Albumin: 2.8 g/dL — ABNORMAL LOW (ref 3.5–5.0)
Alkaline Phosphatase: 54 U/L (ref 38–126)
Anion gap: 11 (ref 5–15)
BUN: 12 mg/dL (ref 8–23)
CO2: 23 mmol/L (ref 22–32)
Calcium: 8.6 mg/dL — ABNORMAL LOW (ref 8.9–10.3)
Chloride: 108 mmol/L (ref 98–111)
Creatinine, Ser: 0.87 mg/dL (ref 0.61–1.24)
GFR calc Af Amer: >60 mL/min (ref >60)
GFR calc non Af Amer: >60 mL/min (ref >60)
Glucose, Bld: 108 mg/dL — ABNORMAL HIGH (ref 70–99)
Potassium: 4 mmol/L (ref 3.5–5.1)
Sodium: 142 mmol/L (ref 135–145)
Total Bilirubin: 0.9 mg/dL (ref 0.3–1.2)
Total Protein: 7.2 g/dL (ref 6.5–8.1)

## 2020-08-16 LAB — CBC WITH DIFFERENTIAL/PLATELET
Abs Immature Granulocytes: 0.12 10*3/uL — ABNORMAL HIGH (ref 0.00–0.07)
Basophils Absolute: 0.1 10*3/uL (ref 0.0–0.1)
Basophils Relative: 1 %
Eosinophils Absolute: 0.1 10*3/uL (ref 0.0–0.5)
Eosinophils Relative: 1 %
HCT: 44.2 % (ref 39.0–52.0)
Hemoglobin: 14.2 g/dL (ref 13.0–17.0)
Immature Granulocytes: 1 %
Lymphocytes Relative: 9 %
Lymphs Abs: 1.2 10*3/uL (ref 0.7–4.0)
MCH: 30.3 pg (ref 26.0–34.0)
MCHC: 32.1 g/dL (ref 30.0–36.0)
MCV: 94.4 fL (ref 80.0–100.0)
Monocytes Absolute: 1.4 10*3/uL — ABNORMAL HIGH (ref 0.1–1.0)
Monocytes Relative: 11 %
Neutro Abs: 9.9 10*3/uL — ABNORMAL HIGH (ref 1.7–7.7)
Neutrophils Relative %: 77 %
Platelets: 279 10*3/uL (ref 150–400)
RBC: 4.68 MIL/uL (ref 4.22–5.81)
RDW: 13 % (ref 11.5–15.5)
WBC: 12.8 10*3/uL — ABNORMAL HIGH (ref 4.0–10.5)
nRBC: 0 % (ref 0.0–0.2)

## 2020-08-16 LAB — URINE CULTURE

## 2020-08-16 LAB — LEGIONELLA PNEUMOPHILA SEROGP 1 UR AG: L. pneumophila Serogp 1 Ur Ag: NEGATIVE

## 2020-08-16 MED ORDER — GERHARDT'S BUTT CREAM
TOPICAL_CREAM | Freq: Two times a day (BID) | CUTANEOUS | Status: DC
  Filled 2020-08-16 (×2): qty 1

## 2020-08-16 MED ORDER — SODIUM CHLORIDE 0.9 % IV SOLN
INTRAVENOUS | Status: DC | PRN
  Administered 2020-08-16: 250 mL via INTRAVENOUS

## 2020-08-16 NOTE — ED Notes (Signed)
Complete linen change to include gown and peri care provided for pt. Male purewick changed. Pillows added to pt's RT side to relieve pressure from bottom. Pt has no complaints at this time.

## 2020-08-16 NOTE — Progress Notes (Signed)
NEW ADMISSION NOTE New Admission Note:   Arrival Method: Patient arrived from ED. Mental Orientation: Alert and oriented x 1 but follow commands. Telemetry: 103M-10 HB Ist degree Assessment: Completed Skin: Dry and cold to touch in the lower extremities, stage 1 on the left buttock and stage 2 on the right buttock and ecchymosis noted on bilateral arms.  Black scab noted on the center of the head. IV: R AC SL. Pain: Denies any pain. Tubes: External male catheter. Safety Measures: Safety Fall Prevention Plan has been given, discussed and signed Admission: Completed [facility] Orientation: Patient has been orientated to the room, unit and staff.    Orders have been reviewed and implemented. Will continue to monitor the patient. Call light has been placed within reach and bed alarm has been activated.   Amaryllis Dyke, RN

## 2020-08-16 NOTE — Progress Notes (Signed)
Daily Progress Note   Patient Name: Alec Davis       Date: 08/16/2020 DOB: 07/25/1923  Age: 86 y.o. MRN#: [id number] Attending Physician: Hosie Poisson, MD Primary Care Physician: [name], DO Admit Date: 08/15/2020  Reason for Consultation/Follow-up: Establishing goals of care  Subjective: I have reviewed medical records including EPIC notes, labs, and imaging. Received report from primary RN - no acute concerns. RN reports patient's agitation has improved and at this time is not requiring mittens for safety, but his PO intake remains poor.    Went to visit patient at bedside - wife, son, and daughter were present. Patient was awake, alert, oriented to self only, and not able to participate in complex medical decision making. No signs or non-verbal gestures of pain or discomfort noted. No respiratory distress, increased work of breathing, or secretions noted. Patient denied pain.  Met with patient's wife/Alec Davis, daughter/Alec Davis, and son/Alec Davis to discuss diagnosis, prognosis, GOC, EOL wishes, disposition, and options.  I introduced Palliative Medicine as specialized medical care for people living with serious illness. It focuses on providing relief from the symptoms and stress of a serious illness. The goal is to improve quality of life for both the patient and the family.  We discussed patient's current illness and what it means in the larger context of patient's on-going co-morbidities. Natural disease trajectory and expectations at EOL were discussed. I attempted to elicit values and goals of care important to the patient.  The difference between aggressive medical intervention and comfort care was considered in light of the patient's goals of care. Discussed the patient's current  medical situation and generalized weakness/deconditioned stated in detail and what this might mean going forward for his care. The family/wife do not feel the she is able to give Alec Davis the care he needs at home in his current deconditioned stated. Reviewed PT recommendations of needing 24 hour supervision and assistance. Alec Davis/daughter lives in [place] and Alec Davis lives in [place], [place] - they are unable to offer home support. Disposition options were reviewed and discussed. The family feels the patient may need LTC on discharge. The patient is currently from [facility] independent living - if this facility offers skilled nursing, they are most interested in this. If not, they want the patient to be as close to his wife as  possible. Hospice philosophy was introduced as discussed. I do not feel at this time the patient would qualify for residential hospice, but if he continues to decline and have decreased PO intake, he very well could in the near future. The family would like residential or home hospice support at a LTC facility on discharge depending on hospital course and if he qualifies.  Advance directives, concepts specific to code status, artificial feeding and hydration, and rehospitalization were considered and discussed. Educated family on MOST form - discussed and completed today at bedside (please see recommendation section below for details). Encouraged patient/family to consider DNR/DNI status understanding evidenced based poor outcomes in similar hospitalized patient, as the cause of arrest is likely associated with advanced chronic illness rather than an easily reversible acute cardio-pulmonary event. [place] was agreeable to DNR/DNI with understanding that the patient would not receive CPR, defibrillation, ACLS medications, or intubation.   The family's goal is for the patient to be treated in house and discharge when medically stable. They want to prevent any rehospitalizations and  keep him comfortable wherever he discharges to.  Patient intermittently slept throughout conversation.  Discussed with patient/family the importance of continued conversation with each other and the medical providers regarding overall plan of care and treatment options, ensuring decisions are within the context of the patient's values and GOCs.    Questions and concerns were addressed. The family was encouraged to call with questions or concerns. PMT card was provided.  Length of Stay: 1  Current Medications: Scheduled Meds:  . enoxaparin (LOVENOX) injection  40 mg Subcutaneous Q24H    Continuous Infusions: . azithromycin Stopped (08/15/20 2331)  . cefTRIAXone (ROCEPHIN)  IV Stopped (08/15/20 2226)    PRN Meds:   Physical Exam Vitals and nursing note reviewed.  Constitutional:      General: He is not in acute distress. Pulmonary:     Effort: No respiratory distress.  Skin:    General: Skin is warm and dry.  Neurological:     Mental Status: He is alert. He is disoriented.     Motor: Weakness present.  Psychiatric:        Behavior: Behavior is cooperative.        Cognition and Memory: Memory is impaired.             Vital Signs: BP (!) 141/87 (BP Location: Left Arm)   Pulse 73   Temp (!) 97.5 F (36.4 C) (Oral)   Resp 18   Ht (P) 6' 2"  (1.88 m)   Wt 90.7 kg   SpO2 96%   BMI (P) 25.68 kg/m  SpO2: SpO2: 96 % O2 Device: O2 Device: Room Air O2 Flow Rate:    Intake/output summary:   Intake/Output Summary (Last 24 hours) at 08/16/2020 1108 Last data filed at 08/16/2020 6389 Gross per 24 hour  Intake --  Output 650 ml  Net -650 ml   LBM:   Baseline Weight: Weight: 90.7 kg Most recent weight: Weight: 90.7 kg       Palliative Assessment/Data: PPS 30%      Patient Active Problem List   Diagnosis Date Noted  . Sepsis due to pneumonia ([place]) 08/15/2020  . COVID-19 virus infection 08/15/2020  . Generalized weakness 08/15/2020  . Abdominal pain 08/15/2020   . Dementia with behavioral disturbance ([place]) 08/15/2020  . Continuous leakage of urine 05/09/2020  . Edema, lower extremity 05/09/2020  . Scalp lesion 05/09/2020  . 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 Heart block 12/18/2016  .  Second degree AV block, Mobitz type I 12/17/2016  . Abdominal pain, epigastric 12/17/2016  . Mobitz type 1 second degree atrioventricular block   . Hyperlipemia 08/03/2013  . Depression 08/03/2013  . Vitamin D deficiency 03/07/2011  . GERD (gastroesophageal reflux disease) 03/07/2011  . Prostate cancer ([place]) 01/19/2011  . Essential hypertension 01/19/2011  . Right bundle branch block 01/19/2011  . INTERNAL HEMORRHOIDS 01/19/2011  . DIVERTICULOSIS, COLON 01/19/2011  . COLONIC POLYPS, HX OF 01/19/2011    Palliative Care Assessment & Plan   Patient Profile: 86 y.o. male  with past medical history of HTN, hyperlipidemia, prostate cancer, BPH, and possible dementia. He presents to the ED on 08/15/2020 with complaints of weakness. Patient was in his usual state of health until he was diagnosed with COVID-19 infection approximately 3 weeks ago (he was fully vaccinated in March). Reportedly had a significant cough, and then stopped eating about 2 weeks ago. Yesterday, he slid off the bed and family was unable to get him up so called EMS.  In the ED, he is noted to be tachypneic. Labs are significant for WBC-19, lipase-24, and lactic acid 1.6->2. CT scan of the abdomen and pelvis noted bibasilar airspace opacity and consolidation concerning for possible infection versus aspiration. Sepsis protocol was initiated and he was treated with broad-spectrum antibiotics and IV fluid.  Palliative care has been consulted to assist with goals of care.   Assessment: Sepsis secondary to pneumonia Recent diagnosis of COVID19 infection Generalized weakness/debility Confusion/possible underlying dementia Abdominal pain  Recommendations/Plan:  Continue full scope medical  treatment  DNR/DNI initiated  The family's goal for the patient is for discharge when medically stable, then to not be rehospitalized but to keep him comfortable where he discharges to.   Family does not feel patient is safe to return home with his wife in his deconditioned state, as she is elderly herself and would have trouble taking care of his needs. Family is agreeable to LTC facility. If [facility] has a skilled section, they would prefer this; if they do not, family wants the patient to be placed as close to his wife as possible, who lives as [facility] independent living.  Family was also agreeable to hospice services on discharge - evaluation pending.  TOC consult placed. TOC and hospice liaison notified of family's wishes.  Copies of the patient's Living Will, HCPOA, DNR, and MOST form were made and will be scanned into Vynca.  MOST form was completed and outlined as follows: Comfort measures, Determine use or limitation of antibiotics when infection occurs, IV fluids for a defined trial period, No feeding tube  Original DNR and MOST form documents were placed in shadow chart  PMT will continue to follow holistically  Goals of Care and Additional Recommendations:  Limitations on Scope of Treatment: Full Scope Treatment  Code Status:    Code Status Orders  (From admission, onward)         Start     Ordered   08/15/20 1426  Full code  Continuous        08/15/20 1427        Code Status History    Date Active Date Inactive Code Status Order ID Comments User Context   04/03/2017 0424 04/12/2017 1843 Full Code [id number]  Stark Klein, MD Inpatient   12/17/2016 0815 12/22/2016 1607 Full Code [id number]  Samella Parr, NP Inpatient   Advance Care Planning Activity       Prognosis:   < 6 months  Discharge Planning:  Home with Hospice  Care plan was discussed with primary RN, Dr. Karleen Hampshire, [facility], hospice liaison, patient, wife, daughter, son  Thank  you for allowing the Palliative Medicine Team to assist in the care of this patient.   Total Time  70 minutes Prolonged Time Billed  yes       Greater than 50%  of this time was spent counseling and coordinating care related to the above assessment and plan.  Lin Landsman, NP  Please contact Palliative Medicine Team phone at [phone] for questions and concerns.

## 2020-08-16 NOTE — ED Notes (Signed)
Attempted report x1. 

## 2020-08-16 NOTE — Progress Notes (Signed)
PROGRESS NOTE    Alec Davis  KDT:[id number] DOB: 08/27/1923 DOA: 08/15/2020 PCP: [name], DO   Chief Complaint  Patient presents with  . Weakness    Brief Narrative:   86 year old gentleman with prior history of hypertension hyperlipidemia diagnosed with Covid 3 weeks ago, dementia, BPH presents to ED with generalized to ED weakness and persistent cough from independent living facility.  Patient reports loss of appetite in addition to this on arrival to ED he was found to be afebrile, mildly tachypneic, leukocytosis with elevated lactic acid.  CT of the abdomen and pelvis showed bibasilar heterogeneous opacity and consolidation concerning for possible infection versus aspiration.  Patient was initially started on IV vancomycin, metronidazole and cefepime and IV fluids and referred to [facility] for admission.  Assessment & Plan:   Principal Problem:   Sepsis due to pneumonia [facility]) Active Problems:   COVID-19 virus infection   Generalized weakness   Abdominal pain   Dementia with behavioral disturbance (HCC)   Pressure injury of skin  SIRS probably from aspiration pneumonia/ pneumonia:  - sepsis ruled out.  - no tachypnea today, pt is on RA with good oxygen sats. Repeat labs pending.  Lactic acid has normalized.  Pro calcitonin is <0.10. Pt empirically on rocephin and azithromycin. Continue the same.     Dementia with behavioral abnormalities:  Pt currently is calm without any mittens on.  Safety sitter not at bedside.  Watch for hospital delirium, re orient the patient, pull the blinds open during the day.     Recent COVID 19 infection:  Reported negative test at the facility on 8/23.   Loss of appetite and mild abdominal discomfort:  Probably from the COVID 19 illness.  CT abd ruled out acute intra abdominal pathology.  Encourage oral intake.    Pressure injury on admission:  Pressure Injury 08/16/20 Buttocks Right Stage 2 -  Partial thickness loss of  dermis presenting as a shallow open injury with a red, pink wound bed without slough. (Active)  08/16/20 0820  Location: Buttocks  Location Orientation: Right  Staging: Stage 2 -  Partial thickness loss of dermis presenting as a shallow open injury with a red, pink wound bed without slough.  Wound Description (Comments):   Present on Admission: Yes     Pressure Injury 08/16/20 Buttocks Left Stage 1 -  Intact skin with non-blanchable redness of a localized area usually over a bony prominence. (Active)  08/16/20 0820  Location: Buttocks  Location Orientation: Left  Staging: Stage 1 -  Intact skin with non-blanchable redness of a localized area usually over a bony prominence.  Wound Description (Comments):   Present on Admission: Yes   Wound care consulted and recommendations.    Due to poor progression, multiple co morbidities, dementia, cognitive deficits, clinical decline, palliative care consulted for goals of care discussion.   PT evaluation recommending home health PT.   DVT prophylaxis:  LOVENOX.  Code Status: Full code.  Family Communication: none at bedside.  Disposition:   Status is: Inpatient  Remains inpatient appropriate because:IV treatments appropriate due to intensity of illness or inability to take PO   Dispo: The patient is from: Home              Anticipated d/c is to: pending.               Anticipated d/c date is: 2 days              Patient currently  is not medically stable to d/c.       Consultants:   Wound care  Palliative care.   Procedures: None Antimicrobials: none.   Subjective: Pt reports not feeling good, appears slightly confused. RN notified feet are cold to touch ,but has DP pulses.   Objective: Vitals:   08/16/20 0705 08/16/20 0823 08/16/20 0926 08/16/20 1200  BP:  (!) 141/87    Pulse:  73    Resp:  18    Temp: 97.9 F (36.6 C)  (!) 97.5 F (36.4 C)   TempSrc: Oral  Oral   SpO2:  96%    Weight:    89.5 kg  Height:  6\' 2"   (1.88 m)      Intake/Output Summary (Last 24 hours) at 08/16/2020 1406 Last data filed at 08/16/2020 1300 Gross per 24 hour  Intake 120 ml  Output 650 ml  Net -530 ml   Filed Weights   08/15/20 0908 08/16/20 1200  Weight: 90.7 kg 89.5 kg    Examination:  General exam: Appears calm and comfortable, not on oxygen.  Respiratory system: diminished at bases. No wheezing or rhonchi. No tachypnea.  Cardiovascular system: S1 & S2 heard, RRR. No JVD, . No pedal edema. Gastrointestinal system: Abdomen is nondistended, soft and nontender.  Normal bowel sounds heard. Central nervous system: Alert and oriented. No focal neurological deficits. Extremities: Symmetric 5 x 5 power. Skin: No rashes, lesions or ulcers Psychiatry: . Mood & affect appropriate.     Data Reviewed: I have personally reviewed following labs and imaging studies  CBC: Recent Labs  Lab 08/15/20 0931  WBC 19.0*  NEUTROABS 15.5*  HGB 13.9  HCT 43.6  MCV 95.0  PLT 774    Basic Metabolic Panel: Recent Labs  Lab 08/15/20 0931  NA 141  K 4.1  CL 108  CO2 24  GLUCOSE 162*  BUN 16  CREATININE 1.22  CALCIUM 8.7*    GFR: Estimated Creatinine Clearance: 42.1 mL/min (by C-G formula based on SCr of 1.22 mg/dL).  Liver Function Tests: Recent Labs  Lab 08/15/20 0931  AST 26  ALT 29  ALKPHOS 57  BILITOT 1.1  PROT 7.3  ALBUMIN 3.0*    CBG: No results for input(s): GLUCAP in the last 168 hours.   Recent Results (from the past 240 hour(s))  Urine culture     Status: Abnormal   Collection Time: 08/15/20  9:04 AM   Specimen: In/Out Cath Urine  Result Value Ref Range Status   Specimen Description IN/OUT CATH URINE  Final   Special Requests   Final    NONE Performed at [facility], [address] [place], [place] [address]    Culture MULTIPLE SPECIES PRESENT, SUGGEST RECOLLECTION (A)  Final   Report Status 08/16/2020 FINAL  Final  Blood culture (routine single)     Status: None (Preliminary  result)   Collection Time: 08/15/20  9:33 AM   Specimen: BLOOD  Result Value Ref Range Status   Specimen Description BLOOD SITE NOT SPECIFIED  Final   Special Requests   Final    BOTTLES DRAWN AEROBIC AND ANAEROBIC Blood Culture adequate volume   Culture   Final    NO GROWTH < 24 HOURS Performed at [facility], [place] [address], [place], [place] [address]    Report Status PENDING  Incomplete  Culture, blood (single)     Status: None (Preliminary result)   Collection Time: 08/15/20 10:24 AM   Specimen: BLOOD  Result Value Ref Range Status   Specimen Description BLOOD LEFT ANTECUBITAL  Final   Special Requests   Final    AEROBIC BOTTLE ONLY Blood Culture results may not be optimal due to an inadequate volume of blood received in culture bottles   Culture   Final    NO GROWTH < 24 HOURS Performed at [facility] [address] [place], [place] [address]    Report Status PENDING  Incomplete         Radiology Studies: CT Abdomen Pelvis W Contrast  Result Date: 08/15/2020 CLINICAL DATA:  Epigastric pain, right upper quadrant pain and tenderness, history of gallstone ileus EXAM: CT ABDOMEN AND PELVIS WITH CONTRAST TECHNIQUE: Multidetector CT imaging of the abdomen and pelvis was performed using the standard protocol following bolus administration of intravenous contrast. CONTRAST:  123mL OMNIPAQUE IOHEXOL 300 MG/ML  SOLN COMPARISON:  08/02/2019 FINDINGS: Lower chest: Heterogeneous airspace opacity and consolidation of the dependent bilateral lung bases. Three-vessel coronary artery calcifications. Mitral annulus calcifications. Aortic valve calcifications. Aortic atherosclerosis. Hepatobiliary: No solid liver abnormality is seen. Hepatic steatosis. Status post cholecystectomy. No biliary ductal dilatation Pancreas: Unremarkable. No pancreatic ductal dilatation or surrounding inflammatory changes. Spleen: Normal in size without significant abnormality. Adrenals/Urinary Tract:  Stable, definitively benign left adrenal adenoma. Benign exophytic cyst of the posterior right kidney. Kidneys are normal, without renal calculi, solid lesion, or hydronephrosis. Tiny calculi in the dependent urinary bladder (series 3, image 96). Stomach/Bowel: Stomach is within normal limits. Appendix appears normal. No evidence of bowel wall thickening, distention, or inflammatory changes. Descending and sigmoid diverticulosis. Vascular/Lymphatic: No significant vascular findings are present. No enlarged abdominal or pelvic lymph nodes. Reproductive: Prostatomegaly. Other: No abdominal wall hernia or abnormality. No abdominopelvic ascites. Musculoskeletal: No acute or significant osseous findings. IMPRESSION: 1. No acute CT findings within the abdomen or pelvis to explain epigastric pain. 2. Dependent bibasilar heterogeneous airspace opacity and consolidation, concerning for infection or aspiration. 3. Hepatic steatosis. 4. Descending and sigmoid diverticulosis without evidence of acute diverticulitis. 5. Tiny calculi in the dependent urinary bladder. No ureteral calculi. No hydronephrosis. 6. Prostatomegaly. 7. Coronary artery disease.  Aortic Atherosclerosis (ICD10-I70.0). Electronically Signed   By: Eddie Candle M.D.   On: 08/15/2020 13:27   DG Chest Port 1 View  Result Date: 08/15/2020 CLINICAL DATA:  Questionable sepsis. EXAM: PORTABLE CHEST 1 VIEW COMPARISON:  August 02, 2019 FINDINGS: Trachea is midline. Cardiomediastinal contours are stable. Lung volumes are low. No consolidation or pleural effusion. On limited assessment skeletal structures without acute process. Multiple leads overlie the chest also limiting assessment. IMPRESSION: Low lung volumes without acute cardiopulmonary process. Electronically Signed   By: Zetta Bills M.D.   On: 08/15/2020 09:29        Scheduled Meds: . enoxaparin (LOVENOX) injection  40 mg Subcutaneous Q24H  . Gerhardt's butt cream   Topical BID   Continuous  Infusions: . azithromycin Stopped (08/15/20 2331)  . cefTRIAXone (ROCEPHIN)  IV Stopped (08/15/20 2226)     LOS: 1 day        Hosie Poisson, MD [facility]   To contact the attending provider between 7A-7P or the covering provider during after hours 7P-7A, please log into the web site [email or web address] and access using universal [place] password for that web site. If you do not have the password, please call the hospital operator.  08/16/2020, 2:06 PM

## 2020-08-16 NOTE — ED Notes (Signed)
Breakfast Ordered 

## 2020-08-16 NOTE — Evaluation (Addendum)
I agree with the following treatment note after review of the documentation. This session was performed under the supervision of a licensed clinician.   Lou Miner, DPT  Acute Rehabilitation Services  Pager: [phone]  Physical Therapy Evaluation Patient Details Name: Alec Davis MRN: [id number] DOB: June 25, 1923 Today's Date: 08/16/2020   History of Present Illness  pt is a 86 yo male who presents to the ED with weakness and a dry cough. Pt was found to have sepsis related to pneumonia. Pt with recent COVID diagnosis 3 weeks ago. Pt has a PMH of HTN, HLD, Prostate cancer, BPH and possible dementia  Clinical Impression  Pt was evaluated for the above diagnosis and impairments below. Pt required min guard to min assist with all mobility tasks assessed. Pt refusing ambulation this session and states he is tired and wants to go back to bed. Per family, pt was from an independent living facility with his wife, but his family would like to move them to an apartment with a home aide and HHPT. Recommend max HH services for this pt at d/c. Pt would continue to benefit from acute therapy in order to increase his independence with functional tasks. Will continue to follow acutely.     Follow Up Recommendations Home health PT;Supervision/Assistance - 24 hour (HHOT, HHAide)    Equipment Recommendations  None recommended by PT    Recommendations for Other Services OT consult     Precautions / Restrictions Precautions Precautions: Fall Restrictions Weight Bearing Restrictions: No      Mobility  Bed Mobility Overal bed mobility: Needs Assistance Bed Mobility: Supine to Sit;Sit to Supine     Supine to sit: Min assist;HOB elevated Sit to supine: Min assist;HOB elevated   General bed mobility comments: pt required min assist at BUE and increased time for assistance to EOB   Transfers Overall transfer level: Needs assistance Equipment used: 2 person hand held assist Transfers: Sit  to/from Stand Sit to Stand: Min assist;+2 safety/equipment      General transfer comment: pt required +2 min assit with sit<>Stand for safety. Pt's daughter was at his side helping while the therapist on opposite providing lift assist. Pt refusing further mobility   Ambulation/Gait          General Gait Details: pt refused to ambulate this session  Stairs       Wheelchair Mobility    Modified Rankin (Stroke Patients Only)       Balance Overall balance assessment: Needs assistance Sitting-balance support: Feet supported;No upper extremity supported Sitting balance-Leahy Scale: Fair     Standing balance support: Bilateral upper extremity supported Standing balance-Leahy Scale: Poor Standing balance comment: pt was unable to do standing marches due to poor balance. Pt required min guard assist with standing at EOB              Pertinent Vitals/Pain Pain Assessment: Faces Faces Pain Scale: No hurt    Home Living Family/patient expects to be discharged to:: Private residence Living Arrangements: Spouse/significant other Available Help at Discharge: Family;Available 24 hours/day Type of Home: Apartment Home Access: Level entry     Home Layout: One level Home Equipment: Cane - single point;Grab bars - toilet;Toilet riser;Grab bars - tub/shower;Wheelchair - [employer] - 2 wheels Additional Comments: a caregiver comes in to help for 5 hours a day for doing laundry and making meals. wife is able to help and assist him if needed.  He was admitted from an independent living facility and family plans to  put him and his wife in an apt with home aide w/ HHPT    Prior Function Level of Independence: Needs assistance   Gait / Transfers Assistance Needed: pt ambulates short distances with RW  ADL's / Homemaking Assistance Needed: Requires assist for IADLs        Hand Dominance        Extremity/Trunk Assessment   Upper Extremity Assessment Upper Extremity  Assessment: Generalized weakness    Lower Extremity Assessment Lower Extremity Assessment: Generalized weakness    Cervical / Trunk Assessment Cervical / Trunk Assessment: Normal  Communication   Communication: HOH  Cognition Arousal/Alertness: Lethargic;Awake/alert Behavior During Therapy: Flat affect Overall Cognitive Status: History of cognitive impairments - at baseline Area of Impairment: Orientation;Problem solving          Orientation Level: Disoriented to;Place;Time;Situation           Problem Solving: Decreased initiation;Slow processing General Comments: pt was noted to be [facility]; however, he was noted to either have slow processing or just didn't want to answer the questions. Pt was not willing to do much movement today and only wanted to stay in bed. Dementia at baseline       General Comments      Exercises     Assessment/Plan    PT Assessment Patient needs continued PT services  PT Problem List Decreased strength;Decreased range of motion;Decreased balance;Decreased activity tolerance;Decreased mobility;Decreased coordination;Decreased knowledge of use of DME       PT Treatment Interventions DME instruction;Gait training;Functional mobility training;Therapeutic activities;Therapeutic exercise;Balance training;Cognitive remediation;Neuromuscular re-education;Patient/family education    PT Goals (Current goals can be found in the Care Plan section)  Acute Rehab PT Goals Patient Stated Goal: get home  PT Goal Formulation: With patient/family Time For Goal Achievement: 08/30/20 Potential to Achieve Goals: Fair    Frequency Min 3X/week   Barriers to discharge        Co-evaluation               AM-PAC PT "6 Clicks" Mobility  Outcome Measure Help needed turning from your back to your side while in a flat bed without using bedrails?: A Little Help needed moving from lying on your back to sitting on the side of a flat bed without using bedrails?:  A Little Help needed moving to and from a bed to a chair (including a wheelchair)?: A Little Help needed standing up from a chair using your arms (e.g., wheelchair or bedside chair)?: A Little Help needed to walk in hospital room?: A Little Help needed climbing 3-5 steps with a railing? : A Lot 6 Click Score: 17    End of Session   Activity Tolerance: Patient tolerated treatment well Patient left: in bed;with call bell/phone within reach;with bed alarm set;with family/visitor present Nurse Communication: Mobility status PT Visit Diagnosis: Unsteadiness on feet (R26.81);Muscle weakness (generalized) (M62.81);Difficulty in walking, not elsewhere classified (R26.2)    Time: 9892-1194 PT Time Calculation (min) (ACUTE ONLY): 21 min   Charges:   PT Evaluation $PT Eval Moderate Complexity: 1 [place], SPT  Acute Rehabilitation Services  Office: [phone]  08/16/2020, 12:27 PM

## 2020-08-16 NOTE — Consult Note (Signed)
[place] Nurse Consult Note: Reason for Consult: scrotal wound Discussed with WTA (wound treatment associate) Ulice Dash on the floor, she has excellent documentation of the wounds POA Wound type: Full thickness injury; unclear etiology posterior scrotum Stage 1 Pressure injury Stage 2 Pressure injury Pressure Injury POA: Yes Measurement: see nursing flow sheet Wound bed: see nursing flow sheet Drainage (amount, consistency, odor) see nursing flow sheet Periwound: intact  Dressing procedure/placement/frequency: Add Gerharts butt cream to affected areas.    Re consult if needed, will not follow at this time. Thanks  Makinsley Schiavi [employer], RN,CWOCN, CNS, [place] [phone])

## 2020-08-17 DIAGNOSIS — R531 Weakness: Secondary | ICD-10-CM

## 2020-08-17 DIAGNOSIS — Z7189 Other specified counseling: Secondary | ICD-10-CM

## 2020-08-17 DIAGNOSIS — R109 Unspecified abdominal pain: Secondary | ICD-10-CM

## 2020-08-17 DIAGNOSIS — Z789 Other specified health status: Secondary | ICD-10-CM

## 2020-08-17 DIAGNOSIS — Z515 Encounter for palliative care: Secondary | ICD-10-CM

## 2020-08-17 DIAGNOSIS — Z66 Do not resuscitate: Secondary | ICD-10-CM

## 2020-08-17 NOTE — Plan of Care (Signed)
  Problem: Pain Managment: Goal: General experience of comfort will improve Outcome: Progressing   

## 2020-08-17 NOTE — TOC Initial Note (Addendum)
Transition of Care [facility]) - Initial/Assessment Note    Patient Details  Name: Alec Davis MRN: [id number] Date of Birth: [birth year]  [facility] of [facility]) CM/SW Contact:    Alec Davis Phone Number: 08/17/2020, 11:52 AM  Clinical Narrative:                 12:45p CSW met with patient's daughter Alec Davis to further discuss discharge plan. Patient's daughter expressed patient does not have Medicaid, which would assist with covering long-term care. Daughter stated family would have to further look into Medicaid. CSW discussed patient going to SNF with palliative following, considering concern for patient to return to IDL at this time. Patient's daughter expressed understanding and agreement to the plan. She requested clarification on the need of hospice. CSW agreed to relay the information to the MD, to clarify with daughter.   11:50a CSW met with patient and patient's son Alec Davis who as present in the room. Patient's son expressed patient has been with his wife 70+ years, and they would prefer for patient to be close to her at discharge. The preference is [facility] for LTC with hospice. Patient's son stated they are open to hospice at patient's [facility], if patient is unable to be placed on the SNF side of [facility]. CSW explained the need to contact [facility] to inquire on LTC placement for patient. Patient's son stated he has a contact for [facility] Alec Davis) and he can reach out.   CSW contacted and left a voicemail for [facility] with [facility]. TOC team will continue to follow.  Expected Discharge Plan: Skilled Nursing Facility Barriers to Discharge: Continued Medical Work up   Patient Goals and CMS Choice   CMS Medicare.gov Compare Post Acute Care list provided to:: Patient Represenative (must comment) Alec Davis) Choice offered to / list presented to : Adult Children  Expected Discharge Plan and Services Expected Discharge  Plan: [place] In-house Referral: Clinical Social Work     Living arrangements for the past 2 months: [place]                                      Prior Living Arrangements/Services Living arrangements for the past 2 months: [place] Lives with:: Spouse Patient language and need for interpreter reviewed:: Yes Do you feel safe going back to the place where you live?: Yes        Care giver support system in place?: Yes (comment)   Criminal Activity/Legal Involvement Pertinent to Current Situation/Hospitalization: No - Comment as needed  Activities of Daily Living      Permission Sought/Granted Permission sought to share information with : Family Supports, Customer service manager    Share Information with NAME: Alec Davis  Permission granted to share info w AGENCY: SNF  Permission granted to share info w Relationship: Spouse  Permission granted to share info w Contact Information: [phone]  Emotional Assessment   Attitude/Demeanor/Rapport: Unable to Assess Affect (typically observed): Unable to Assess Orientation: : Oriented to Self, Oriented to Place Alcohol / Substance Use: Not Applicable Psych Involvement: No (comment)  Admission diagnosis:  Weakness [R53.1] Sepsis due to pneumonia ([place]) [J18.9, A41.9] Sepsis, due to unspecified organism, unspecified whether acute organ dysfunction present [facility]) [A41.9] Patient Active Problem List   Diagnosis Date Noted  . Pressure injury of skin 08/16/2020  . Palliative care by specialist   .  Goals of care, counseling/discussion   . DNR (do not resuscitate)   . DNI (do not intubate)   . Advanced directive placed in chart this admission   . Advanced directives, counseling/discussion   . Copy of advanced directive obtained   . Sepsis ([place])   . Sepsis due to pneumonia ([place]) 08/15/2020  . COVID-19 virus infection 08/15/2020  . Generalized weakness  08/15/2020  . Abdominal pain 08/15/2020  . Dementia with behavioral disturbance ([place]) 08/15/2020  . Continuous leakage of urine 05/09/2020  . Edema, lower extremity 05/09/2020  . Scalp lesion 05/09/2020  . 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 Heart block 12/18/2016  . Second degree AV block, Mobitz type I 12/17/2016  . Abdominal pain, epigastric 12/17/2016  . Mobitz type 1 second degree atrioventricular block   . Hyperlipemia 08/03/2013  . Depression 08/03/2013  . Vitamin D deficiency 03/07/2011  . GERD (gastroesophageal reflux disease) 03/07/2011  . Prostate cancer ([place]) 01/19/2011  . Essential hypertension 01/19/2011  . Right bundle branch block 01/19/2011  . INTERNAL HEMORRHOIDS 01/19/2011  . DIVERTICULOSIS, COLON 01/19/2011  . COLONIC POLYPS, HX OF 01/19/2011   PCP:  Alec Norlander, DO Pharmacy:   [place], [place] - [address] [place] [address] Phone: [phone] Fax: [phone]     Social Determinants of Health (SDOH) Interventions    Readmission Risk Interventions No flowsheet data found.

## 2020-08-17 NOTE — Progress Notes (Addendum)
PROGRESS NOTE    Alec Davis  DPO:[id number] DOB: 1923-12-19 DOA: 08/15/2020 PCP: [name], DO   Chief Complaint  Patient presents with  . Weakness    Brief Narrative:   86 year old gentleman with prior history of hypertension hyperlipidemia diagnosed with Covid 3 weeks ago, dementia, BPH presents to ED with generalized to ED weakness and persistent cough from independent living facility.  Patient reports loss of appetite in addition to this on arrival to ED he was found to be afebrile, mildly tachypneic, leukocytosis with elevated lactic acid.  CT of the abdomen and pelvis showed bibasilar heterogeneous opacity and consolidation concerning for possible infection versus aspiration.  Patient was initially started on IV vancomycin, metronidazole and cefepime and IV fluids and referred to [facility] for admission.  Pt seen and examined at bedside. He denies any new complaints. Reports his appetite is not good.   Assessment & Plan:   Principal Problem:   Sepsis due to pneumonia [facility]) Active Problems:   COVID-19 virus infection   Generalized weakness   Abdominal pain   Dementia with behavioral disturbance (HCC)   Pressure injury of skin   Palliative care by specialist   Goals of care, counseling/discussion   DNR (do not resuscitate)   DNI (do not intubate)   Advanced directive placed in chart this admission   Advanced directives, counseling/discussion   Copy of advanced directive obtained   Sepsis ([place])  SIRS probably from aspiration pneumonia/ pneumonia:  - sepsis ruled out.  - no tachypnea today, pt is on RA with good oxygen sats. Lactic acid has normalized.  Pro calcitonin is <0.10. Pt empirically on rocephin and azithromycin. Continue the same.  Pt remains afebrile and leukocytosis improving.    Dementia without behavioral abnormalities:  Pt currently is calm without any mittens on.  Safety sitter not at bedside.  Watch for hospital delirium, re orient the  patient, pull the blinds open during the day. No agitation. He is pleasant.  Palliative care discussions going on.     Recent COVID 19 infection:  Reported negative test at the facility on 8/23.   Loss of appetite and mild abdominal discomfort:  Probably from the COVID 19 illness.  CT abd ruled out acute intra abdominal pathology.  Encourage oral intake.  Can start the patient on megace if family is agreeable.    Pressure injury on admission:  Pressure Injury 08/16/20 Buttocks Right Stage 2 -  Partial thickness loss of dermis presenting as a shallow open injury with a red, pink wound bed without slough. (Active)  08/16/20 0820  Location: Buttocks  Location Orientation: Right  Staging: Stage 2 -  Partial thickness loss of dermis presenting as a shallow open injury with a red, pink wound bed without slough.  Wound Description (Comments):   Present on Admission: Yes     Pressure Injury 08/16/20 Buttocks Left Stage 1 -  Intact skin with non-blanchable redness of a localized area usually over a bony prominence. (Active)  08/16/20 0820  Location: Buttocks  Location Orientation: Left  Staging: Stage 1 -  Intact skin with non-blanchable redness of a localized area usually over a bony prominence.  Wound Description (Comments):   Present on Admission: Yes   Wound care consulted and recommendations given. .    Due to poor progression, multiple co morbidities, dementia, cognitive deficits, clinical decline, palliative care consulted for goals of care discussion.   PT evaluation recommending home health PT vs outpatient rehab at [facility].  DVT prophylaxis:  LOVENOX.  Code Status: Full code.  Family Communication: . Spoke to family member at bedside Disposition:   Status is: Inpatient  Remains inpatient appropriate because:IV treatments appropriate due to intensity of illness or inability to take PO   Dispo: The patient is from: Home              Anticipated d/c is  to: SNF              Anticipated d/c date is: 2 days              Patient currently is not medically stable to d/c.       Consultants:   Wound care  Palliative care.   Procedures: None Antimicrobials: none.   Subjective: Pt appears comfortable , he denies chest pain, sob, nausea, vomiting or abdominal pain or cough.   Objective: Vitals:   08/16/20 1708 08/16/20 2048 08/17/20 0456 08/17/20 0945  BP: 130/81 134/76 138/69 126/66  Pulse: 77 79 79 85  Resp: 18 18 17 18   Temp: 97.8 F (36.6 C) 97.7 F (36.5 C) 98.3 F (36.8 C) 97.6 F (36.4 C)  TempSrc: Oral Oral Oral Oral  SpO2: 96% 97% 95% 99%  Weight:      Height:        Intake/Output Summary (Last 24 hours) at 08/17/2020 1613 Last data filed at 08/17/2020 1255 Gross per 24 hour  Intake 973.4 ml  Output 850 ml  Net 123.4 ml   Filed Weights   08/15/20 0908 08/16/20 1200  Weight: 90.7 kg 89.5 kg    Examination:  General exam: alert and comfortable. Not in distress. Laying in bed and comfortable.  Respiratory system: air entry fair, no wheezing or rhonchi. On RA with good sats.  Cardiovascular system: S1S2, RRR, no JVD, no pedal edema.  Gastrointestinal system: abdomen is soft, non tender non distended, bowel sounds good.  Central nervous system: alert, and oriented to person and place only .  Extremities: no pedal edema or cyanosis.  Skin: No rashes.  Psychiatry: . Mood is appropriate.     Data Reviewed: I have personally reviewed following labs and imaging studies  CBC: Recent Labs  Lab 08/15/20 0931 08/16/20 1618  WBC 19.0* 12.8*  NEUTROABS 15.5* 9.9*  HGB 13.9 14.2  HCT 43.6 44.2  MCV 95.0 94.4  PLT 309 932    Basic Metabolic Panel: Recent Labs  Lab 08/15/20 0931 08/16/20 1618  NA 141 142  K 4.1 4.0  CL 108 108  CO2 24 23  GLUCOSE 162* 108*  BUN 16 12  CREATININE 1.22 0.87  CALCIUM 8.7* 8.6*    GFR: Estimated Creatinine Clearance: 59.1 mL/min (by C-G formula based on SCr of 0.87  mg/dL).  Liver Function Tests: Recent Labs  Lab 08/15/20 0931 08/16/20 1618  AST 26 30  ALT 29 31  ALKPHOS 57 54  BILITOT 1.1 0.9  PROT 7.3 7.2  ALBUMIN 3.0* 2.8*    CBG: No results for input(s): GLUCAP in the last 168 hours.   Recent Results (from the past 240 hour(s))  Urine culture     Status: Abnormal   Collection Time: 08/15/20  9:04 AM   Specimen: In/Out Cath Urine  Result Value Ref Range Status   Specimen Description IN/OUT CATH URINE  Final   Special Requests   Final    NONE Performed at [facility], [address], [place], [place] [address]    Culture MULTIPLE SPECIES PRESENT, SUGGEST  RECOLLECTION (A)  Final   Report Status 08/16/2020 FINAL  Final  Blood culture (routine single)     Status: None (Preliminary result)   Collection Time: 08/15/20  9:33 AM   Specimen: BLOOD  Result Value Ref Range Status   Specimen Description BLOOD SITE NOT SPECIFIED  Final   Special Requests   Final    BOTTLES DRAWN AEROBIC AND ANAEROBIC Blood Culture adequate volume   Culture   Final    NO GROWTH 2 DAYS Performed at [facility], [address] [place], [place] [address]    Report Status PENDING  Incomplete  Culture, blood (single)     Status: None (Preliminary result)   Collection Time: 08/15/20 10:24 AM   Specimen: BLOOD  Result Value Ref Range Status   Specimen Description BLOOD LEFT ANTECUBITAL  Final   Special Requests   Final    AEROBIC BOTTLE ONLY Blood Culture results may not be optimal due to an inadequate volume of blood received in culture bottles   Culture   Final    NO GROWTH 2 DAYS Performed at [facility], [place] [address], [place], [place] [address]    Report Status PENDING  Incomplete         Radiology Studies: No results found.      Scheduled Meds: . enoxaparin (LOVENOX) injection  40 mg Subcutaneous Q24H  . Gerhardt's butt cream   Topical BID   Continuous Infusions: . sodium chloride Stopped (08/17/20 0040)  .  azithromycin Stopped (08/17/20 0040)  . cefTRIAXone (ROCEPHIN)  IV Stopped (08/16/20 2310)     LOS: 2 days        Hosie Poisson, MD [facility]   To contact the attending provider between 7A-7P or the covering provider during after hours 7P-7A, please log into the web site [email or web address] and access using universal [place] password for that web site. If you do not have the password, please call the hospital operator.  08/17/2020, 4:13 PM

## 2020-08-17 NOTE — Evaluation (Addendum)
Occupational Therapy Evaluation Patient Details Name: Alec Davis MRN: [id number] DOB: 1923-06-23 Today's Date: 08/17/2020    History of Present Illness pt is a 86 yo male who presents to the ED with weakness and a dry cough. Pt was found to have sepsis related to pneumonia. Pt with recent COVID diagnosis 3 weeks ago. Pt has a PMH of HTN, HLD, Prostate cancer, BPH and possible dementia   Clinical Impression   Pt admitted with the above diagnoses and presents with below problem list. Pt will benefit from continued acute OT to address the below listed deficits and maximize independence with basic ADLs prior to d/c to venue below. PTA pt was ambulating short distances with rollator, some assistance needed with basic ADLs. Pt currently mod A with UB ADLs, max A with LB ADLs, +2 to attempt stand-pivot transfers and mobility. Pt with decreased initiation (baseline dementia) but ultimately agreeable to stand EOB for balance assessment. Pt noted to stand <15 seconds before quickly sitting back down and initiating return to supine. Son present throughout session. Pt would benefit from [facility] and increasing [facility] aide hours.    Follow Up Recommendations  Home health OT;Supervision/Assistance - 24 hour    Equipment Recommendations  None recommended by OT    Recommendations for Other Services       Precautions / Restrictions Precautions Precautions: Fall Restrictions Weight Bearing Restrictions: No      Mobility Bed Mobility Overal bed mobility: Needs Assistance Bed Mobility: Supine to Sit;Sit to Supine     Supine to sit: Min assist;HOB elevated Sit to supine: Min assist;HOB elevated   General bed mobility comments: min A to steady, +2 to scoot up in bed  Transfers Overall transfer level: Needs assistance Equipment used: Rolling walker (2 wheeled) Transfers: Sit to/from Stand Sit to Stand: Mod assist         General transfer comment: mod A from regular height surface utilizing rw  (rollator at baseline). Cued to sidestep towards [facility] but pt only able to stand briefly before sitting back down "that's enough. I'm going back down"    Balance Overall balance assessment: Needs assistance Sitting-balance support: Feet supported;No upper extremity supported Sitting balance-Leahy Scale: Fair Sitting balance - Comments: immediately reaching for rw to stand, immediately laying back down after standing breifly   Standing balance support: Bilateral upper extremity supported Standing balance-Leahy Scale: Poor Standing balance comment: stood about 15 seconds.                            ADL either performed or assessed with clinical judgement   ADL Overall ADL's : Needs assistance/impaired Eating/Feeding: Set up;Sitting   Grooming: Moderate assistance;Sitting Grooming Details (indicate cue type and reason): mod A for throughness of task and some steadying assist in unsupported sitting Upper Body Bathing: Moderate assistance;Sitting Upper Body Bathing Details (indicate cue type and reason): mod A for throughness of task and some steadying assist in unsupported sitting Lower Body Bathing: Maximal assistance;Sit to/from stand;+2 for safety/equipment   Upper Body Dressing : Moderate assistance;Sitting Upper Body Dressing Details (indicate cue type and reason): mod A for throughness of task and some steadying assist in unsupported sitting Lower Body Dressing: Maximal assistance;+2 for safety/equipment;Sit to/from stand   Toilet Transfer: +2 for physical assistance;+2 for safety/equipment             General ADL Comments: Pt completed bed mobility and with max encouragement stood briefly EOB. Immediately reaching for rw  to stand. Fatigued quickly back into sitting position.      Vision Baseline Vision/History: Wears glasses Wears Glasses: At all times       Perception     Praxis      Pertinent Vitals/Pain Pain Assessment: Faces Pain Score: 0-No pain      Hand Dominance Right   Extremity/Trunk Assessment Upper Extremity Assessment Upper Extremity Assessment: Generalized weakness   Lower Extremity Assessment Lower Extremity Assessment: Generalized weakness;Defer to PT evaluation       Communication Communication Communication: HOH   Cognition Arousal/Alertness: Awake/alert Behavior During Therapy: Flat affect Overall Cognitive Status: History of cognitive impairments - at baseline                                 General Comments: dementia at baseline. Decreases initiation. Son reports pt gets in a comfortable position and doesn't want to move out of it.    General Comments       Exercises     Shoulder Instructions      Home Living Family/patient expects to be discharged to:: Private residence Living Arrangements: Spouse/significant other Available Help at Discharge: Family;Available 24 hours/day Type of Home: Apartment Home Access: Level entry     Home Layout: One level     Bathroom Shower/Tub: Teacher, early years/pre: Standard Bathroom Accessibility: Yes   Home Equipment: Cane - single point;Grab bars - toilet;Toilet riser;Grab bars - tub/shower;Wheelchair - [employer] - 2 wheels   Additional Comments: a caregiver comes in to help for 5 hours a day for doing laundry and making meals. wife is able to help and assist him if needed.  He was admitted from an independent living facility and family plans to put him and his wife in an apt with home aide w/ HHPT      Prior Functioning/Environment Level of Independence: Needs assistance  Gait / Transfers Assistance Needed: pt ambulates short distances with RW ADL's / Homemaking Assistance Needed: Requires assist for IADLs            OT Problem List: Decreased strength;Decreased activity tolerance;Impaired balance (sitting and/or standing);Decreased knowledge of use of DME or AE;Decreased knowledge of precautions;Cardiopulmonary status  limiting activity      OT Treatment/Interventions: Self-care/ADL training;Therapeutic exercise;Energy conservation;DME and/or AE instruction;Therapeutic activities;Patient/family education;Balance training    OT Goals(Current goals can be found in the care plan section) Acute Rehab OT Goals Patient Stated Goal: home with [facility] aide and HH therapies OT Goal Formulation: With patient/family Time For Goal Achievement: 08/31/20 Potential to Achieve Goals: Good ADL Goals Pt Will Perform Upper Body Dressing: with min assist;sitting Pt Will Perform Lower Body Dressing: with min assist;sit to/from stand Pt Will Transfer to Toilet: with min assist;ambulating Pt Will Perform Toileting - Clothing Manipulation and hygiene: with min assist;sit to/from stand Additional ADL Goal #1: Pt will complete bed mobility at supervision level to prepare for EOB/OOB ADLs.  OT Frequency: Min 2X/week   Barriers to D/C:            Co-evaluation              AM-PAC OT "6 Clicks" Daily Activity     Outcome Measure Help from another person eating meals?: A Little Help from another person taking care of personal grooming?: A Little Help from another person toileting, which includes using toliet, bedpan, or urinal?: A Lot Help from another person bathing (including washing, rinsing, drying)?: A  Lot Help from another person to put on and taking off regular upper body clothing?: A Little Help from another person to put on and taking off regular lower body clothing?: A Lot 6 Click Score: 15   End of Session Equipment Utilized During Treatment: Rolling walker  Activity Tolerance: Patient limited by fatigue Patient left: in bed;with call bell/phone within reach;with bed alarm set;with family/visitor present  OT Visit Diagnosis: Unsteadiness on feet (R26.81);Pain;Muscle weakness (generalized) (M62.81)                Time: 4967-5916 OT Time Calculation (min): 19 min Charges:  OT General Charges $OT Visit: 1  Visit OT Evaluation $OT Eval Low Complexity: [place], OT Acute Rehabilitation Services Pager: [phone] Office: [phone]   Hortencia Pilar 08/17/2020, 9:34 AM

## 2020-08-17 NOTE — NC FL2 (Signed)
[place] MEDICAID FL2 LEVEL OF CARE SCREENING TOOL     IDENTIFICATION  Patient Name: Alec Davis Birthdate: [birth year] Sex: male Admission Date (Current Location): 08/15/2020  [address] [place] Number:  Herbalist and Address:  [name] [place] [address], [place], [place] [address]      Provider Number: [id number]  Attending Physician Name and Address:  Hosie Poisson, MD  Relative Name and Phone Number:  Karmelo Bass, daughter    Current Level of Care: Hospital Recommended Level of Care: [place] Prior Approval Number:    Date Approved/Denied:   PASRR Number: [id number]  Discharge Plan: SNF    Current Diagnoses: Patient Active Problem List   Diagnosis Date Noted  . Pressure injury of skin 08/16/2020  . Palliative care by specialist   . Goals of care, counseling/discussion   . DNR (do not resuscitate)   . DNI (do not intubate)   . Advanced directive placed in chart this admission   . Advanced directives, counseling/discussion   . Copy of advanced directive obtained   . Sepsis ([place])   . Sepsis due to pneumonia ([place]) 08/15/2020  . COVID-19 virus infection 08/15/2020  . Generalized weakness 08/15/2020  . Abdominal pain 08/15/2020  . Dementia with behavioral disturbance ([place]) 08/15/2020  . Continuous leakage of urine 05/09/2020  . Edema, lower extremity 05/09/2020  . Scalp lesion 05/09/2020  . 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 Heart block 12/18/2016  . Second degree AV block, Mobitz type I 12/17/2016  . Abdominal pain, epigastric 12/17/2016  . Mobitz type 1 second degree atrioventricular block   . Hyperlipemia 08/03/2013  . Depression 08/03/2013  . Vitamin D deficiency 03/07/2011  . GERD (gastroesophageal reflux disease) 03/07/2011  . Prostate cancer ([place]) 01/19/2011  . Essential hypertension 01/19/2011  . Right bundle branch block 01/19/2011  . INTERNAL HEMORRHOIDS 01/19/2011  . DIVERTICULOSIS,  COLON 01/19/2011  . COLONIC POLYPS, HX OF 01/19/2011    Orientation RESPIRATION BLADDER Height & Weight     Self, Place  Normal Incontinent, External catheter Weight: 197 lb 5 oz (89.5 kg) Height:  6\' 2"  (188 cm)  BEHAVIORAL SYMPTOMS/MOOD NEUROLOGICAL BOWEL NUTRITION STATUS      Continent Diet (See discharge summary)  AMBULATORY STATUS COMMUNICATION OF NEEDS Skin   Limited Assist Verbally PU Stage and Appropriate Care                       Personal Care Assistance Level of Assistance  Bathing, Feeding, Dressing Bathing Assistance: Limited assistance Feeding assistance: Limited assistance Dressing Assistance: Limited assistance     Functional Limitations Info  Sight, Hearing, Speech Sight Info: Adequate Hearing Info: Adequate Speech Info: Adequate    SPECIAL CARE FACTORS FREQUENCY  PT (By licensed PT), OT (By licensed OT)     PT Frequency: 5x a week OT Frequency: 5x a week            Contractures Contractures Info: Not present    Additional Factors Info  Code Status, Allergies Code Status Info: DNR Allergies Info: NKA           Current Medications (08/17/2020):  This is the current hospital active medication list Current Facility-Administered Medications  Medication Dose Route Frequency Provider Last Rate Last Admin  . 0.9 %  sodium chloride infusion   Intravenous PRN Hosie Poisson, MD   Stopped at 08/17/20 0040  . azithromycin (ZITHROMAX) 500 mg in sodium chloride 0.9 % 250 mL  IVPB  500 mg Intravenous Q24H Norval Morton, MD   Stopped at 08/17/20 0040  . cefTRIAXone (ROCEPHIN) 2 g in sodium chloride 0.9 % 100 mL IVPB  2 g Intravenous Q24H Norval Morton, MD   Stopped at 08/16/20 2310  . enoxaparin (LOVENOX) injection 40 mg  40 mg Subcutaneous Q24H Fuller Plan A, MD   40 mg at 08/16/20 2233  . Gerhardt's butt cream   Topical BID Hosie Poisson, MD   Given at 08/16/20 2311     Discharge Medications: Please see discharge summary for a list of  discharge medications.  Relevant Imaging Results:  Relevant Lab Results:   Additional Information SSN [id number]  Waylan Boga, [place]

## 2020-08-17 NOTE — Progress Notes (Signed)
Daily Progress Note   Patient Name: Alec Davis       Date: 08/17/2020 DOB: [birth year]  Age: 86 y.o. MRN#: [id number] Attending Physician: Hosie Poisson, MD Primary Care Physician: [name], DO Admit Date: 08/15/2020  Reason for Consultation/Follow-up: Disposition and Establishing goals of care  Subjective: Chart review performed. Received report from primary RN - no acute concerns.   Went to visit patient at bedside - daughter/Alec Davis was present. Patient was lying in bed asleep - he did wake up to voice and gentle touch but slept intermittently during visit. He denied pain. No signs or non-verbal gestures of pain or discomfort noted. No respiratory distress, increased work of breathing, or secretions noted. He was not agitated.  [name] stated the patient had eaten some applesauce and fruit today, as well as had some tea.   [name] expressed that insurance is an issue now getting the patient into LTC. She recognizes that the patient still is not safe to return home to live with his wife. They are now considering discharging him to SNF rehab at [facility]. [name] understands that the patient may not tolerate or benefit from rehab as he currently is not motivated to fully participate with in house PT but wants to try. [name] feels that rehab might be their only option at this point while they are working on getting the patient Medicaid. Explained and recommended outpatient Palliative Care on discharge if he goes to rehab. The difference between hospice and outpatient Palliative Care was explained - [name] expressed understanding.  After discussion, the daughter is hopeful for the patient to be accepted at [facility] rehab with outpatient Palliative Care depending on hospital  course and insurance. She has reached out to someone at [facility], but stated she would not hear back from them until Monday.  All questions/concerns addressed. Encouraged to call with questions/concerns. PMT card was provided.   Length of Stay: 2  Current Medications: Scheduled Meds:   enoxaparin (LOVENOX) injection  40 mg Subcutaneous Q24H   Gerhardt's butt cream   Topical BID    Continuous Infusions:  sodium chloride Stopped (08/17/20 0040)   azithromycin Stopped (08/17/20 0040)   cefTRIAXone (ROCEPHIN)  IV Stopped (08/16/20 2310)    PRN Meds: sodium chloride  Physical Exam Vitals and nursing note reviewed.  Constitutional:  General: He is not in acute distress. Pulmonary:     Effort: No respiratory distress.  Skin:    General: Skin is cool and dry.  Neurological:     Mental Status: He is lethargic and confused.     Motor: Weakness present.  Psychiatric:        Behavior: Behavior is cooperative.        Cognition and Memory: Cognition is impaired.             Vital Signs: BP 126/66 (BP Location: Left Arm)    Pulse 85    Temp 97.6 F (36.4 C) (Oral)    Resp 18    Ht 6\' 2"  (1.88 m)    Wt 89.5 kg    SpO2 99%    BMI 25.33 kg/m  SpO2: SpO2: 99 % O2 Device: O2 Device: Room Air O2 Flow Rate:    Intake/output summary:   Intake/Output Summary (Last 24 hours) at 08/17/2020 1511 Last data filed at 08/17/2020 1255 Gross per 24 hour  Intake 973.4 ml  Output 850 ml  Net 123.4 ml   LBM: Last BM Date: 08/15/20 Baseline Weight: Weight: 90.7 kg Most recent weight: Weight: 89.5 kg       Palliative Assessment/Data: PPS 40%    Flowsheet Rows     Most Recent Value  Intake Tab  Referral Department Hospitalist  Unit at Time of Referral ER  Palliative Care Primary Diagnosis Other (Comment)  [failure to thrive, significant decline after COVID infection]  Date Notified 08/15/20  Palliative Care Type New Palliative care  Reason for referral Counsel  Regarding Hospice, Psychosocial or Spiritual support, Clarify Goals of Care  Date of Admission 08/15/20  Date first seen by Palliative Care 08/15/20  # of days Palliative referral response time 0 Day(s)  # of days IP prior to Palliative referral 0  Clinical Assessment  Psychosocial & Spiritual Assessment  Palliative Care Outcomes  Patient/Family meeting held? Yes  Who was at the meeting? patient, daughter, son, wife  Palliative Care Outcomes ACP counseling assistance, Changed CPR status, Counseled regarding hospice, Clarified goals of care  Patient/Family wishes: Interventions discontinued/not started  PEG, Mechanical Ventilation, Tube feedings/TPN      Patient Active Problem List   Diagnosis Date Noted   Pressure injury of skin 08/16/2020   Palliative care by specialist    Goals of care, counseling/discussion    DNR (do not resuscitate)    DNI (do not intubate)    Advanced directive placed in chart this admission    Advanced directives, counseling/discussion    Copy of advanced directive obtained    Sepsis ([place])    Sepsis due to pneumonia ([place]) 08/15/2020   COVID-19 virus infection 08/15/2020   Generalized weakness 08/15/2020   Abdominal pain 08/15/2020   Dementia with behavioral disturbance ([place]) 08/15/2020   Continuous leakage of urine 05/09/2020   Edema, lower extremity 05/09/2020   Scalp lesion 05/09/2020   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   Heart block 12/18/2016   Second degree AV block, Mobitz type I 12/17/2016   Abdominal pain, epigastric 12/17/2016   Mobitz type 1 second degree atrioventricular block    Hyperlipemia 08/03/2013   Depression 08/03/2013   Vitamin D deficiency 03/07/2011   GERD (gastroesophageal reflux disease) 03/07/2011   Prostate cancer ([place]) 01/19/2011   Essential hypertension 01/19/2011   Right bundle branch block 01/19/2011   INTERNAL HEMORRHOIDS 01/19/2011   DIVERTICULOSIS, COLON 01/19/2011   COLONIC POLYPS,  HX OF 01/19/2011    Palliative  Care Assessment & Plan   Patient Profile: 86 y.o.malewith past medical history of HTN, hyperlipidemia, prostate cancer, BPH, and possible dementia. He presents to the [facility]26[facility]2021with complaints of weakness.Patient was in his usual state of health until he was diagnosed with COVID-19 infection approximately 3 weeks ago (he was fully vaccinated in March). Reportedly had a significant cough, and then stopped eating about 2 weeks ago. Yesterday, he slid off the bed and family was unable to get him up so called EMS. In the ED, he is noted to be tachypneic. Labs are significant for WBC-19, lipase-24, and lactic acid 1.6->2. CT scan of the abdomen and pelvis noted bibasilar airspace opacity and consolidation concerning for possible infection versus aspiration. Sepsis protocol was initiated and he was treated with broad-spectrum antibiotics and IV fluid.  Palliative care has been consulted to assist with goals of care.  Assessment: Sepsis secondary to pneumonia Recent diagnosis of COVID19 infection Generalized weakness/debility Confusion/possible underlying dementia Abdominal pain  Recommendations/Plan:  Continue current full scope medical treatment  Continue DNR/DNI  Due to insurance issues, the family is now considering [facility] SNF rehab on discharge. If the patient discharges to rehab they are open for outpatient Palliative Care to follow - TOC notified and consult placed  Decisions are pending insurance  PMT will continue to follow holistically  Goals of Care and Additional Recommendations:  Limitations on Scope of Treatment: Full Scope Treatment  Code Status:    Code Status Orders  (From admission, onward)         Start     Ordered   08/16/20 1728  Do not attempt resuscitation (DNR)  Continuous       Question Answer Comment  In the event of cardiac or respiratory ARREST Do not call a code blue   In the event of cardiac  or respiratory ARREST Do not perform Intubation, CPR, defibrillation or ACLS   In the event of cardiac or respiratory ARREST Use medication by any route, position, wound care, and other measures to relive pain and suffering. May use oxygen, suction and manual treatment of airway obstruction as needed for comfort.      08/16/20 1727        Code Status History    Date Active Date Inactive Code Status Order ID Comments User Context   08/15/2020 1427 08/16/2020 1727 Full Code [id number]  Norval Morton, MD ED   04/03/2017 0424 04/12/2017 1843 Full Code [id number]  Stark Klein, MD Inpatient   12/17/2016 0815 12/22/2016 1607 Full Code [id number]  Samella Parr, NP Inpatient   Advance Care Planning Activity       Prognosis:   Unable to determine  Discharge Planning:  [place] for rehab with Palliative care service follow-up  Care plan was discussed with primary RN, Dr. Karleen Hampshire, patient, daughter/Alec Davis, TOC  Thank you for allowing the Palliative Medicine Team to assist in the care of this patient.   Total Time 25 minutes Prolonged Time Billed  no       Greater than 50%  of this time was spent counseling and coordinating care related to the above assessment and plan.  Lin Landsman, NP  Please contact Palliative Medicine Team phone at [phone] for questions and concerns.

## 2020-08-18 MED ORDER — SODIUM CHLORIDE 0.9 % IV SOLN
500.0000 mg | Freq: Once | INTRAVENOUS | Status: AC
  Administered 2020-08-18: 500 mg via INTRAVENOUS
  Filled 2020-08-18: qty 500

## 2020-08-18 MED ORDER — MIRTAZAPINE 15 MG PO TABS
7.5000 mg | ORAL_TABLET | Freq: Every day | ORAL | Status: DC
  Filled 2020-08-18 (×4): qty 1

## 2020-08-18 MED ORDER — PANTOPRAZOLE SODIUM 40 MG PO TBEC
40.0000 mg | DELAYED_RELEASE_TABLET | Freq: Every day | ORAL | Status: DC
  Administered 2020-08-18 – 2020-08-22 (×5): 40 mg via ORAL
  Filled 2020-08-18 (×7): qty 1

## 2020-08-18 MED ORDER — AZITHROMYCIN 500 MG PO TABS
500.0000 mg | ORAL_TABLET | Freq: Once | ORAL | Status: DC
  Filled 2020-08-18: qty 1

## 2020-08-18 NOTE — Progress Notes (Signed)
PROGRESS NOTE    Alec Davis  PPJ:[id number] DOB: 27-Oct-1923 DOA: 08/15/2020 PCP: [name], DO   Chief Complaint  Patient presents with  . Weakness    Brief Narrative:   86 year old gentleman with prior history of hypertension hyperlipidemia diagnosed with Covid 3 weeks ago, dementia, BPH presents to ED with generalized to ED weakness and persistent cough from independent living facility.  Patient reports loss of appetite in addition to this on arrival to ED he was found to be afebrile, mildly tachypneic, leukocytosis with elevated lactic acid.  CT of the abdomen and pelvis showed bibasilar heterogeneous opacity and consolidation concerning for possible infection versus aspiration.  Patient was initially started on IV vancomycin, metronidazole and cefepime and IV fluids and referred to [facility] for admission. Pt seen and examined at bedside with family at bedside, transitioned broad spectrum IV antibiotics to rocephin and zithromax. PT evaluation recommending SNF.  Palliative care consulted and recommendations to follow up as outpatient.  Alec Davis worried that patient is not eating well. Will start the patient on Remeron and protonix.    Assessment & Plan:   Principal Problem:   Sepsis due to pneumonia [facility]) Active Problems:   COVID-19 virus infection   Generalized weakness   Abdominal pain   Dementia with behavioral disturbance (HCC)   Pressure injury of skin   Palliative care by specialist   Goals of care, counseling/discussion   DNR (do not resuscitate)   DNI (do not intubate)   Advanced directive placed in chart this admission   Advanced directives, counseling/discussion   Copy of advanced directive obtained   Sepsis ([place])  SIRS probably from aspiration pneumonia/ pneumonia:  - sepsis ruled out.  - no tachypnea today, pt is on RA with good oxygen sats. Lactic acid has normalized.  Pro calcitonin is <0.10. Pt empirically on rocephin and azithromycin. Resume the  same, . Pt currently denies any chest pain or sob, no nausea, vomiting or abd pain.  Pt remains afebrile and leukocytosis improving.    Dementia without behavioral abnormalities:  Pt currently is calm without any mittens on.  Safety sitter not at bedside.  Watch for hospital delirium, re orient the patient, pull the blinds open during the day. No agitation. He is pleasant.  Palliative care consulted, recommend outpatient follow up with hospice if he does not improve with physical therapy.    Recent COVID 19 infection:  Reported negative test at the facility on 8/23.   Loss of appetite and mild abdominal discomfort:  Probably from the COVID 19 illness.   CT abd ruled out acute intra abdominal pathology.  Encourage oral intake.   Started the patient on Remeron for appetite stimulation, insomnia.    Pressure injury on admission:  Pressure Injury 08/16/20 Buttocks Right Stage 2 -  Partial thickness loss of dermis presenting as a shallow open injury with a red, pink wound bed without slough. (Active)  08/16/20 0820  Location: Buttocks  Location Orientation: Right  Staging: Stage 2 -  Partial thickness loss of dermis presenting as a shallow open injury with a red, pink wound bed without slough.  Wound Description (Comments):   Present on Admission: Yes     Pressure Injury 08/16/20 Buttocks Left Stage 1 -  Intact skin with non-blanchable redness of a localized area usually over a bony prominence. (Active)  08/16/20 0820  Location: Buttocks  Location Orientation: Left  Staging: Stage 1 -  Intact skin with non-blanchable redness of a localized area usually over  a bony prominence.  Wound Description (Comments):   Present on Admission: Yes   Wound care consulted and recommendations given. .    Due to poor progression, multiple co morbidities, dementia, cognitive deficits, clinical decline, palliative care consulted for goals of care discussion.   PT evaluation recommending home  health PT vs [facility].   DVT prophylaxis:  LOVENOX.  Code Status: Full code.  Family Communication: . Spoke to Alec Davis and Alec Davis at bedside.  Disposition:   Status is: Inpatient  Remains inpatient appropriate because:IV treatments appropriate due to intensity of illness or inability to take PO   Dispo: The patient is from: Home              Anticipated d/c is to: SNF              Anticipated d/c date is: 2 days              Patient currently is not medically stable to d/c.       Consultants:   Wound care  Palliative care.   Procedures: None Antimicrobials: none.   Subjective: No new complaints.  Reports lack of appetite, and couldn't sleep   Objective: Vitals:   08/17/20 0945 08/17/20 1630 08/17/20 2145 08/18/20 0920  BP: 126/66 117/63 (!) 141/80 138/85  Pulse: 85 88 74 74  Resp: 18 18 16 18   Temp: 97.6 F (36.4 C) 98.4 F (36.9 C) (!) 97.5 F (36.4 C) 97.8 F (36.6 C)  TempSrc: Oral  Oral Oral  SpO2: 99% 97% 99% 96%  Weight:      Height:        Intake/Output Summary (Last 24 hours) at 08/18/2020 1226 Last data filed at 08/18/2020 0900 Gross per 24 hour  Intake 360 ml  Output 650 ml  Net -290 ml   Filed Weights   08/15/20 0908 08/16/20 1200  Weight: 90.7 kg 89.5 kg    Examination:  General exam: Alert and comfortable, not in distress. Respiratory system: Diminished air entry at bases, no coughing, tachypnea on ambulation Cardiovascular system: S1-S2 heard, regular rate rhythm, no JVD, no pedal edema Gastrointestinal system: Abdomen is soft, nontender, nondistended bowel sounds normal.  Central nervous system: Alert and oriented to person, place, grossly nonfocal.  Extremities: No pedal edema or cyanosis Skin: No rashes seen Psychiatry: .  Mood is appropriate   Data Reviewed: I have personally reviewed following labs and imaging studies  CBC: Recent Labs  Lab 08/15/20 0931 08/16/20 1618  WBC 19.0* 12.8*    NEUTROABS 15.5* 9.9*  HGB 13.9 14.2  HCT 43.6 44.2  MCV 95.0 94.4  PLT 309 315    Basic Metabolic Panel: Recent Labs  Lab 08/15/20 0931 08/16/20 1618  NA 141 142  K 4.1 4.0  CL 108 108  CO2 24 23  GLUCOSE 162* 108*  BUN 16 12  CREATININE 1.22 0.87  CALCIUM 8.7* 8.6*    GFR: Estimated Creatinine Clearance: 59.1 mL/min (by C-G formula based on SCr of 0.87 mg/dL).  Liver Function Tests: Recent Labs  Lab 08/15/20 0931 08/16/20 1618  AST 26 30  ALT 29 31  ALKPHOS 57 54  BILITOT 1.1 0.9  PROT 7.3 7.2  ALBUMIN 3.0* 2.8*    CBG: No results for input(s): GLUCAP in the last 168 hours.   Recent Results (from the past 240 hour(s))  Urine culture     Status: Abnormal   Collection Time: 08/15/20  9:04 AM   Specimen:  In/Out Cath Urine  Result Value Ref Range Status   Specimen Description IN/OUT CATH URINE  Final   Special Requests   Final    NONE Performed at [facility], [address] [place], [place] [address]    Culture MULTIPLE SPECIES PRESENT, SUGGEST RECOLLECTION (A)  Final   Report Status 08/16/2020 FINAL  Final  Blood culture (routine single)     Status: None (Preliminary result)   Collection Time: 08/15/20  9:33 AM   Specimen: BLOOD  Result Value Ref Range Status   Specimen Description BLOOD SITE NOT SPECIFIED  Final   Special Requests   Final    BOTTLES DRAWN AEROBIC AND ANAEROBIC Blood Culture adequate volume   Culture   Final    NO GROWTH 2 DAYS Performed at [facility], [address], [place], [place] [address]    Report Status PENDING  Incomplete  Culture, blood (single)     Status: None (Preliminary result)   Collection Time: 08/15/20 10:24 AM   Specimen: BLOOD  Result Value Ref Range Status   Specimen Description BLOOD LEFT ANTECUBITAL  Final   Special Requests   Final    AEROBIC BOTTLE ONLY Blood Culture results may not be optimal due to an inadequate volume of blood received in culture bottles   Culture   Final    NO  GROWTH 2 DAYS Performed at [facility], [place] [address], [place], [place] [address]    Report Status PENDING  Incomplete         Radiology Studies: No results found.      Scheduled Meds: . enoxaparin (LOVENOX) injection  40 mg Subcutaneous Q24H  . Gerhardt's butt cream   Topical BID  . mirtazapine  7.5 mg Oral QHS  . pantoprazole  40 mg Oral Q0600   Continuous Infusions: . sodium chloride Stopped (08/17/20 0040)  . azithromycin 500 mg (08/17/20 2230)  . cefTRIAXone (ROCEPHIN)  IV 2 g (08/17/20 2150)     LOS: 3 days        Hosie Poisson, MD [facility]   To contact the attending provider between 7A-7P or the covering provider during after hours 7P-7A, please log into the web site [email or web address] and access using universal [place] password for that web site. If you do not have the password, please call the hospital operator.  08/18/2020, 12:26 PM

## 2020-08-18 NOTE — Progress Notes (Addendum)
Pt refused Zithromax po and Remeron po. Pt stated that he was "reluctant to take it." RN tried to explain to pt that it was an antibiotic and that the doctor prescribed it, but pt still refused. RN will make MD aware.   Eleanora Neighbor, RN

## 2020-08-18 NOTE — Progress Notes (Signed)
PHARMACIST - PHYSICIAN COMMUNICATION DR:   Karleen Hampshire CONCERNING: Antibiotic IV to Oral Route Change Policy  RECOMMENDATION: This patient is receiving azithromycin by the intravenous route.  Based on criteria approved by the Pharmacy and Therapeutics Committee, the antibiotic(s) is/are being converted to the equivalent oral dose form(s).   DESCRIPTION: These criteria include:  Patient being treated for a respiratory tract infection, urinary tract infection, cellulitis or clostridium difficile associated diarrhea if on metronidazole  The patient is not neutropenic and does not exhibit a GI malabsorption state  The patient is eating (either orally or via tube) and/or has been taking other orally administered medications for a least 24 hours  The patient is improving clinically and has a Tmax < 100.5  If you have questions about this conversion, please contact the Pharmacy Department  []   [phone] )  [facility] []   [phone] )  [facility] [x]   [phone] )  [facility] []   [phone] )  [facility] []   [phone] )  [facility]

## 2020-08-18 NOTE — Progress Notes (Signed)
Palliative Care Brief Progress Note:  PMT received voicemail this morning from patient's daughter/Alec Davis stating that she had a few questions. I returned her call early this afternoon. Alec Davis did not answer and voicemail was left - no return call. PMT will continue to reach out to try and assist in answering family's questions.  Thank you for allowing PMT to assist in the care of this patient.   Alec Davis [facility] Palliative Medicine Team Team Phone: [phone] NO CHARGE

## 2020-08-19 LAB — CBC WITH DIFFERENTIAL/PLATELET
Abs Immature Granulocytes: 0.14 10*3/uL — ABNORMAL HIGH (ref 0.00–0.07)
Basophils Absolute: 0.1 10*3/uL (ref 0.0–0.1)
Basophils Relative: 1 %
Eosinophils Absolute: 0.2 10*3/uL (ref 0.0–0.5)
Eosinophils Relative: 2 %
HCT: 44.3 % (ref 39.0–52.0)
Hemoglobin: 14.2 g/dL (ref 13.0–17.0)
Immature Granulocytes: 1 %
Lymphocytes Relative: 13 %
Lymphs Abs: 1.2 10*3/uL (ref 0.7–4.0)
MCH: 30.4 pg (ref 26.0–34.0)
MCHC: 32.1 g/dL (ref 30.0–36.0)
MCV: 94.9 fL (ref 80.0–100.0)
Monocytes Absolute: 1 10*3/uL (ref 0.1–1.0)
Monocytes Relative: 10 %
Neutro Abs: 7 10*3/uL (ref 1.7–7.7)
Neutrophils Relative %: 73 %
Platelets: 260 10*3/uL (ref 150–400)
RBC: 4.67 MIL/uL (ref 4.22–5.81)
RDW: 12.9 % (ref 11.5–15.5)
WBC: 9.7 10*3/uL (ref 4.0–10.5)
nRBC: 0 % (ref 0.0–0.2)

## 2020-08-19 LAB — BASIC METABOLIC PANEL
Anion gap: 12 (ref 5–15)
BUN: 13 mg/dL (ref 8–23)
CO2: 24 mmol/L (ref 22–32)
Calcium: 8.6 mg/dL — ABNORMAL LOW (ref 8.9–10.3)
Chloride: 106 mmol/L (ref 98–111)
Creatinine, Ser: 0.97 mg/dL (ref 0.61–1.24)
GFR calc Af Amer: >60 mL/min (ref >60)
GFR calc non Af Amer: >60 mL/min (ref >60)
Glucose, Bld: 128 mg/dL — ABNORMAL HIGH (ref 70–99)
Potassium: 4 mmol/L (ref 3.5–5.1)
Sodium: 142 mmol/L (ref 135–145)

## 2020-08-19 NOTE — Progress Notes (Signed)
Pt refused Protonix po. Pt stated, "I am reluctant to take it." RN explained what medication was and pt still refused. Pt stated he does not like taking pills.   Eleanora Neighbor, RN

## 2020-08-19 NOTE — Progress Notes (Signed)
Daily Progress Note   Patient Name: Alec Davis       Date: 08/19/2020 DOB: 07/29/1923  Age: 86 y.o. MRN#: [id number] Attending Physician: Hosie Poisson, MD Primary Care Physician: [name], DO Admit Date: 08/15/2020  Subjective: I spoke with daughter/Paula by phone in attempt to follow-up from her call to PMT 2 days ago. [name] states the reason she had called was to request help with getting herself, her mother, and her brother in to visit patient at the same time.  [name] then updates me on the plan for disposition, stating she has been in contact with a representative from [facility]. [facility] does have a skilled nursing level of care, and she is hopeful they will soon have a bed for her father. The plan is for SNF/rehab with outpatient palliative. We discussed that outpatient palliative would be beneficial as a way to check-in and have continued goals of care discussions, particularly since it is unknown if his po intake will improve. Discussed that outpatient palliative can also help navigate decisions and options for hospice care if his condition continues to decline.    Length of Stay: 4  Current Medications: Scheduled Meds:   enoxaparin (LOVENOX) injection  40 mg Subcutaneous Q24H   Gerhardt's butt cream   Topical BID   mirtazapine  7.5 mg Oral QHS   pantoprazole  40 mg Oral Q0600    Continuous Infusions:  sodium chloride Stopped (08/17/20 0040)         Vital Signs: BP 136/79 (BP Location: Left Arm)    Pulse 76    Temp 98.1 F (36.7 C) (Oral)    Resp 17    Ht _0  (1.88 m)    Wt 89.5 kg    SpO2 95%    BMI 25.33 kg/m  SpO2: SpO2: 95 % O2 Device: O2 Device: Room Air O2 Flow Rate:    Intake/output summary:   Intake/Output Summary (Last 24  hours) at 08/19/2020 1608 Last data filed at 08/19/2020 1542 Gross per 24 hour  Intake 600 ml  Output 1050 ml  Net -450 ml        Palliative Assessment/Data: PPS 40%    Flowsheet Rows     Most Recent Value  Intake Tab  Referral Department Hospitalist  Unit at Time of Referral ER  Palliative Care Primary Diagnosis Other (Comment)  [failure to thrive, significant decline after COVID infection]  Date Notified 08/15/20  Palliative Care Type New Palliative care  Reason for referral Counsel Regarding Hospice, Psychosocial or Spiritual support, Clarify Goals of Care  Date of Admission 08/15/20  Date first seen by Palliative Care 08/15/20  # of days Palliative referral response time 0 Day(s)  # of days IP prior to Palliative referral 0  Clinical Assessment  Psychosocial & Spiritual Assessment  Palliative Care Outcomes  Patient/Family meeting held? Yes  Who was at the meeting? patient, daughter, son, wife  Palliative Care Outcomes ACP counseling assistance, Changed CPR status, Counseled regarding hospice, Clarified goals of care  Patient/Family wishes: Interventions discontinued/not started  PEG, Mechanical Ventilation, Tube feedings/TPN       Palliative Care Assessment & Plan    HPI/Patient Profile: 86 y.o. male  with past medical history of HTN, hyperlipidemia, prostate cancer, BPH, and possible dementia. He presents to the ED on 08/15/2020 with complaints of weakness. Patient was in his usual state of health until he was diagnosed with COVID-19 infection approximately 3 weeks ago (he was fully vaccinated in March). Reportedly had a significant cough, and then stopped eating about 2 weeks ago. Yesterday, he slid off the bed and family was unable to get him up so called EMS.  In the ED, he is noted to be tachypneic. Labs are significant for WBC-19, lipase-24, and lactic acid 1.6->2. CT scan of the abdomen and pelvis noted bibasilar airspace opacity and consolidation concerning for  possible infection versus aspiration. Sepsis protocol was initiated and he was treated with broad-spectrum antibiotics and IV fluid.  Palliative care has been consulted to assist with goals of care.   Assessment: - pneumonia (likely aspiration) - recent COVID-19 infection - generalized weakness - altered mental status with probable underlying dementia  Recommendations/Plan: - continue current plan of care - DNR/DNI (already in place) - agree with Remeron to treat poor appetite and insomina - outpatient palliative referral after disposition plan is confirned  Scope of treatment (per MOST form completed 08/16/20):   Cardiopulmonary Resuscitation: Do Not Attempt Resuscitation (DNR/No CPR)  Medical Interventions: Comfort Measures: Keep, clean, warm and dry. Use medication by any route, positioning, wound care and other measures to relieve pain and suffering. Use oxygen, suction and manual treatment of airway obstruction as needed for comfort. Do not transfer to hospital unless comfort needs cannot be met in current location.   Antibiotics: - Determine use or limitation of antibiotics when infection occurs  IV Fluids: - IV fluids for a defined trial period  Feeding Tube: - No feeding tube     Code Status:  DNR/DNI  Prognosis:   < 6 months  Discharge Planning:  To Be Determined, likely SNF rehab with outpatient palliative    Thank you for allowing the Palliative Medicine Team to assist in the care of this patient.   Total Time 15 minutes Prolonged Time Billed  no       Greater than 50%  of this time was spent counseling and coordinating care related to the above assessment and plan.  Lavena Bullion, NP  Please contact Palliative Medicine Team phone at [phone] for questions and concerns.

## 2020-08-19 NOTE — Progress Notes (Signed)
PROGRESS NOTE    Alec Davis  TDV:[id number] DOB: [birth year] DOA: 08/15/2020 PCP: [name], DO   Chief Complaint  Patient presents with  . Weakness    Brief Narrative:   86 year old gentleman with prior history of hypertension hyperlipidemia diagnosed with Covid 3 weeks ago, dementia, BPH presents to ED with generalized to ED weakness and persistent cough from independent living facility.  Patient reports loss of appetite in addition to this on arrival to ED he was found to be afebrile, mildly tachypneic, leukocytosis with elevated lactic acid.  CT of the abdomen and pelvis showed bibasilar heterogeneous opacity and consolidation concerning for possible infection versus aspiration.  Patient was initially started on IV vancomycin, metronidazole and cefepime and IV fluids and referred to [facility] for admission.transitioned broad spectrum IV antibiotics to rocephin and zithromax. PT evaluation recommending SNF.  Palliative care consulted and recommendations to follow up as outpatient.  Daughter worried that patient is not eating well. Will start the patient on Remeron and protonix.  Pt seen and examined, no new complaints today.  Plan for SNF when bed is available.    Assessment & Plan:   Principal Problem:   Sepsis due to pneumonia [facility]) Active Problems:   COVID-19 virus infection   Generalized weakness   Abdominal pain   Dementia with behavioral disturbance (HCC)   Pressure injury of skin   Palliative care by specialist   Goals of care, counseling/discussion   DNR (do not resuscitate)   DNI (do not intubate)   Advanced directive placed in chart this admission   Advanced directives, counseling/discussion   Copy of advanced directive obtained   Sepsis ([place])  SIRS probably from aspiration pneumonia/ pneumonia:  - sepsis ruled out.  - no tachypnea today, pt is on RA with good oxygen sats. Lactic acid has normalized.  Pro calcitonin is <0.10. Pt empirically on  rocephin and azithromycin. Resume the same, . Pt currently denies any chest pain or sob, no nausea, vomiting or abd pain.  Pt remains afebrile and leukocytosis improving. No new complaints.     Dementia without behavioral abnormalities:  Pt currently is calm without any mittens on.  Safety sitter not at bedside.  Watch for hospital delirium, re orient the patient, pull the blinds open during the day. No agitation. He is pleasant.  Palliative care consulted, recommend outpatient follow up with hospice if he does not improve with physical therapy.  Pt reports he had good night sleep.    Recent COVID 19 infection:  Reported negative test at the facility on 8/23.   Loss of appetite and mild abdominal discomfort:  Probably from the COVID 19 illness.   CT abd ruled out acute intra abdominal pathology.  Encourage oral intake.   Started the patient on Remeron for appetite stimulation, insomnia.    Pressure injury on admission:  Pressure Injury 08/16/20 Buttocks Right Stage 2 -  Partial thickness loss of dermis presenting as a shallow open injury with a red, pink wound bed without slough. (Active)  08/16/20 0820  Location: Buttocks  Location Orientation: Right  Staging: Stage 2 -  Partial thickness loss of dermis presenting as a shallow open injury with a red, pink wound bed without slough.  Wound Description (Comments):   Present on Admission: Yes     Pressure Injury 08/16/20 Buttocks Left Stage 1 -  Intact skin with non-blanchable redness of a localized area usually over a bony prominence. (Active)  08/16/20 0820  Location: Buttocks  Location Orientation:  Left  Staging: Stage 1 -  Intact skin with non-blanchable redness of a localized area usually over a bony prominence.  Wound Description (Comments):   Present on Admission: Yes   Wound care consulted and recommendations given. .    Due to poor progression, multiple co morbidities, dementia, cognitive deficits, clinical decline,  palliative care consulted for goals of care discussion.  Outpatient palliative follow up .   PT evaluation recommending [facility] health PT [facility]   DVT prophylaxis:  LOVENOX.  Code Status: Full code.  Family Communication: . Spoke to daughter  at bedside.  Disposition:   Status is: Inpatient  Remains inpatient appropriate because:IV treatments appropriate due to intensity of illness or inability to take PO   Dispo: The patient is from: Home              Anticipated d/c is to: SNF              Anticipated d/c date is: 2 days              Patient currently is not medically stable to d/c.       Consultants:   Wound care  Palliative care.   Procedures: None Antimicrobials: none.   Subjective: nonew complaints today.   Objective: Vitals:   08/18/20 1646 08/18/20 2300 08/19/20 0917 08/19/20 1715  BP: 137/84 136/80 136/79 127/77  Pulse: 81 73 76 74  Resp: 18 16 17 18   Temp: 98.4 F (36.9 C) 97.7 F (36.5 C) 98.1 F (36.7 C) 98.5 F (36.9 C)  TempSrc: Oral Oral Oral Oral  SpO2: 97% 93% 95% 100%  Weight:      Height:        Intake/Output Summary (Last 24 hours) at 08/19/2020 1837 Last data filed at 08/19/2020 1753 Gross per 24 hour  Intake 720 ml  Output 1050 ml  Net -330 ml   Filed Weights   08/15/20 0908 08/16/20 1200  Weight: 90.7 kg 89.5 kg    Examination:  General exam: alert and comfortable. Respiratory system:air entry fair, no wheezing heard.  Cardiovascular system: S1S2, RRR, no JVD.  Gastrointestinal system: Abdomen is soft NT ND BS+ Central nervous system:  Alert and confused.  Extremities: no pedal edema. Skin: no rashes seen.  Psychiatry: Mood is appropriate.    Data Reviewed: I have personally reviewed following labs and imaging studies  CBC: Recent Labs  Lab 08/15/20 0931 08/16/20 1618 08/19/20 1032  WBC 19.0* 12.8* 9.7  NEUTROABS 15.5* 9.9* 7.0  HGB 13.9 14.2 14.2  HCT 43.6 44.2 44.3  MCV  95.0 94.4 94.9  PLT 309 279 062    Basic Metabolic Panel: Recent Labs  Lab 08/15/20 0931 08/16/20 1618 08/19/20 1032  NA 141 142 142  K 4.1 4.0 4.0  CL 108 108 106  CO2 24 23 24   GLUCOSE 162* 108* 128*  BUN 16 12 13   CREATININE 1.22 0.87 0.97  CALCIUM 8.7* 8.6* 8.6*    GFR: Estimated Creatinine Clearance: 53 mL/min (by C-G formula based on SCr of 0.97 mg/dL).  Liver Function Tests: Recent Labs  Lab 08/15/20 0931 08/16/20 1618  AST 26 30  ALT 29 31  ALKPHOS 57 54  BILITOT 1.1 0.9  PROT 7.3 7.2  ALBUMIN 3.0* 2.8*    CBG: No results for input(s): GLUCAP in the last 168 hours.   Recent Results (from the past 240 hour(s))  Urine culture     Status: Abnormal  Collection Time: 08/15/20  9:04 AM   Specimen: In/Out Cath Urine  Result Value Ref Range Status   Specimen Description IN/OUT CATH URINE  Final   Special Requests   Final    NONE Performed at [facility], [place] [address], [place], [place] [address]    Culture MULTIPLE SPECIES PRESENT, SUGGEST RECOLLECTION (A)  Final   Report Status 08/16/2020 FINAL  Final  Blood culture (routine single)     Status: None (Preliminary result)   Collection Time: 08/15/20  9:33 AM   Specimen: BLOOD  Result Value Ref Range Status   Specimen Description BLOOD SITE NOT SPECIFIED  Final   Special Requests   Final    BOTTLES DRAWN AEROBIC AND ANAEROBIC Blood Culture adequate volume   Culture   Final    NO GROWTH 4 DAYS Performed at [facility], [address], [place], [place] [address]    Report Status PENDING  Incomplete  Culture, blood (single)     Status: None (Preliminary result)   Collection Time: 08/15/20 10:24 AM   Specimen: BLOOD  Result Value Ref Range Status   Specimen Description BLOOD LEFT ANTECUBITAL  Final   Special Requests   Final    AEROBIC BOTTLE ONLY Blood Culture results may not be optimal due to an inadequate volume of blood received in culture bottles   Culture   Final    NO GROWTH 4  DAYS Performed at [facility], [place] [address] [place],  [address]    Report Status PENDING  Incomplete         Radiology Studies: No results found.      Scheduled Meds: . enoxaparin (LOVENOX) injection  40 mg Subcutaneous Q24H  . Gerhardt's butt cream   Topical BID  . mirtazapine  7.5 mg Oral QHS  . pantoprazole  40 mg Oral Q0600   Continuous Infusions: . sodium chloride Stopped (08/17/20 0040)     LOS: 4 days        Hosie Poisson, MD [facility]   To contact the attending provider between 7A-7P or the covering provider during after hours 7P-7A, please log into the web site [email or web address] and access using universal [place] password for that web site. If you do not have the password, please call the hospital operator.  08/19/2020, 6:37 PM

## 2020-08-19 NOTE — Progress Notes (Signed)
Physical Therapy Treatment Patient Details Name: Alec Davis MRN: [id number] DOB: [birth year] Today's Date: 08/19/2020    History of Present Illness pt is a 86 yo male who presents to the ED with weakness and a dry cough. Pt was found to have sepsis related to pneumonia. Pt with recent COVID diagnosis 3 weeks ago. Pt has a PMH of HTN, HLD, Prostate cancer, BPH and possible dementia    PT Comments    Continuing work on functional mobility and activity tolerance;  Session focused on encouragement for pt to participate and initiate walking; He was initially not wanting OOB, but with daughter's encouragement he agreed; Walked 5 ft with RW and moderate assist for balance due to posterior bias; Noted plan has changed to getting to SNF for rehab; I agree heartily.   Follow Up Recommendations  SNF     Equipment Recommendations  Rolling walker with 5" wheels;3in1 (PT)    Recommendations for Other Services       Precautions / Restrictions Precautions Precautions: Fall Precaution Comments: posterior bias in standing Restrictions Weight Bearing Restrictions: No    Mobility  Bed Mobility Overal bed mobility: Needs Assistance Bed Mobility: Supine to Sit     Supine to sit: Min assist;HOB elevated     General bed mobility comments: Lots of encouragement to participate; Min handheld assist to pull fully to sit  Transfers Overall transfer level: Needs assistance Equipment used: Rolling walker (2 wheeled) Transfers: Sit to/from Stand Sit to Stand: Mod assist         General transfer comment: Light mod assist to power up to stand  Ambulation/Gait Ambulation/Gait assistance: Mod assist;+2 safety/equipment Gait Distance (Feet): 5 Feet Assistive device: Rolling walker (2 wheeled) Gait Pattern/deviations: Step-through pattern;Decreased step length - right;Decreased step length - left;Trunk flexed     General Gait Details: Lots of encouragement to walk; took a few steps to the  doorway, then requested to sit; Posterior bias throught time in standing, requiring mod assist to stay upright; 2nd person to push chair behind for safety   Stairs             Wheelchair Mobility    Modified Rankin (Stroke Patients Only)       Balance     Sitting balance-Leahy Scale: Fair       Standing balance-Leahy Scale: Poor                              Cognition Arousal/Alertness: Awake/alert Behavior During Therapy: Flat affect Overall Cognitive Status: History of cognitive impairments - at baseline                                 General Comments: dementia at baseline. Decreases initiation. Son reports pt gets in a comfortable position and doesn't want to move out of it.       Exercises      General Comments        Pertinent Vitals/Pain Pain Assessment: Faces Faces Pain Scale: No hurt    Home Living                      Prior Function            PT Goals (current goals can now be found in the care plan section) Acute Rehab PT Goals Patient Stated Goal: to get back into the bed PT  Goal Formulation: With patient/family Time For Goal Achievement: 08/30/20 Potential to Achieve Goals: Fair Progress towards PT goals: Progressing toward goals    Frequency    Min 3X/week      PT Plan Discharge plan needs to be updated    Co-evaluation              AM-PAC PT "6 Clicks" Mobility   Outcome Measure  Help needed turning from your back to your side while in a flat bed without using bedrails?: A Little Help needed moving from lying on your back to sitting on the side of a flat bed without using bedrails?: A Little Help needed moving to and from a bed to a chair (including a wheelchair)?: A Little Help needed standing up from a chair using your arms (e.g., wheelchair or bedside chair)?: A Little Help needed to walk in hospital room?: A Lot Help needed climbing 3-5 steps with a railing? : A Lot 6 Click  Score: 16    End of Session Equipment Utilized During Treatment: Gait belt Activity Tolerance: Patient tolerated treatment well (though somewhat self-limiting) Patient left: in chair;with call bell/phone within reach;with chair alarm set Nurse Communication: Mobility status PT Visit Diagnosis: Unsteadiness on feet (R26.81);Muscle weakness (generalized) (M62.81);Difficulty in walking, not elsewhere classified (R26.2)     Time: 2683-4196 PT Time Calculation (min) (ACUTE ONLY): 18 min  Charges:  $Gait Training: 8-22 mins                     Roney Marion, PT  Acute Rehabilitation Services Pager [phone] Office [place] 08/19/2020, 5:42 PM

## 2020-08-20 LAB — CULTURE, BLOOD (SINGLE)
Culture: NO GROWTH
Culture: NO GROWTH
Special Requests: ADEQUATE

## 2020-08-20 NOTE — Progress Notes (Signed)
PROGRESS NOTE    Alec Davis  WUX:[id number] DOB: [birth year] DOA: 08/15/2020 PCP: [name], DO   Chief Complaint  Patient presents with  . Weakness    Brief Narrative:   86 year old gentleman with prior history of hypertension hyperlipidemia diagnosed with Covid 3 weeks ago, dementia, BPH presents to ED with generalized to ED weakness and persistent cough from independent living facility.  Patient reports loss of appetite in addition to this on arrival to ED he was found to be afebrile, mildly tachypneic, leukocytosis with elevated lactic acid.  CT of the abdomen and pelvis showed bibasilar heterogeneous opacity and consolidation concerning for possible infection versus aspiration.  Patient was initially started on IV vancomycin, metronidazole and cefepime and IV fluids and referred to [facility] for admission.transitioned broad spectrum IV antibiotics to rocephin and zithromax. PT evaluation recommending SNF.  Palliative care consulted and recommendations to follow up as outpatient.  Daughter worried that patient is not eating well. Will start the patient on Remeron and protonix.  Dietary consulted for nutritional supplements.  Patient seen and examined at bedside no new complaints at this time.  Completed the course of antibiotics Plan for SNF when bed is available.    Assessment & Plan:   Principal Problem:   Sepsis due to pneumonia [facility]) Active Problems:   COVID-19 virus infection   Generalized weakness   Abdominal pain   Dementia with behavioral disturbance (HCC)   Pressure injury of skin   Palliative care by specialist   Goals of care, counseling/discussion   DNR (do not resuscitate)   DNI (do not intubate)   Advanced directive placed in chart this admission   Advanced directives, counseling/discussion   Copy of advanced directive obtained   Sepsis ([place])  SIRS probably from  pneumonia:  - sepsis ruled out.  - no tachypnea today, pt is on RA with good oxygen  sats. Lactic acid has normalized.  Pro calcitonin is <0.10. Patient was started empirically on IV azithromycin, completed the course Pt remains afebrile and WBC count within normal limits.  Patient currently denies any shortness of breath, chest pain, nausea or vomiting.    Dementia without behavioral abnormalities:  Pt currently is calm without any mittens on.  Safety sitter not at bedside.  Watch for hospital delirium, re orient the patient, pull the blinds open during the day. No agitation. He is pleasant.  Palliative care consulted, recommend outpatient follow up with hospice if he does not improve with physical therapy.  No agitation last night.   Recent COVID 19 infection:  Reported negative test at the facility on 8/23.   Loss of appetite and mild abdominal discomfort:  Probably from the COVID 19 illness.   CT abd ruled out acute intra abdominal pathology.  Encourage oral intake.   Started the patient on Remeron for appetite stimulation, insomnia.  Dietary consulted nutritional supplementation.   Pressure injury on admission:  Pressure Injury 08/16/20 Buttocks Right Stage 2 -  Partial thickness loss of dermis presenting as a shallow open injury with a red, pink wound bed without slough. (Active)  08/16/20 0820  Location: Buttocks  Location Orientation: Right  Staging: Stage 2 -  Partial thickness loss of dermis presenting as a shallow open injury with a red, pink wound bed without slough.  Wound Description (Comments):   Present on Admission: Yes     Pressure Injury 08/16/20 Buttocks Left Stage 1 -  Intact skin with non-blanchable redness of a localized area usually over a bony  prominence. (Active)  08/16/20 0820  Location: Buttocks  Location Orientation: Left  Staging: Stage 1 -  Intact skin with non-blanchable redness of a localized area usually over a bony prominence.  Wound Description (Comments):   Present on Admission: Yes   Wound care consulted and  recommendations given. .    Due to poor progression, multiple co morbidities, dementia, cognitive deficits, clinical decline, palliative care consulted for goals of care discussion.  Outpatient palliative follow up .   PT evaluation recommending home health PT [facility] Currently waiting for SNF placement.  DVT prophylaxis:  LOVENOX.  Code Status: Full code.  Family Communication: . Spoke to daughter and wife at bedside.  Disposition:   Status is: Inpatient  Remains inpatient appropriate because:Unsafe d/c plan   Dispo: The patient is from: Home              Anticipated d/c is to: SNF              Anticipated d/c date is: 2 days              Patient currently is medically stable to d/c.       Consultants:   Wound care  Palliative care.   Procedures: None Antimicrobials: none.   Subjective: No new complaints today  Objective: Vitals:   08/19/20 1715 08/19/20 2112 08/20/20 0421 08/20/20 0924  BP: 127/77 (!) 143/75 (!) 143/78 132/75  Pulse: 74 75 75 74  Resp: 18 18 18 18   Temp: 98.5 F (36.9 C) (!) 97.5 F (36.4 C) 98.1 F (36.7 C) 98 F (36.7 C)  TempSrc: Oral Oral Oral Oral  SpO2: 100% 98% 97% 98%  Weight:      Height:        Intake/Output Summary (Last 24 hours) at 08/20/2020 1611 Last data filed at 08/20/2020 1332 Gross per 24 hour  Intake 480 ml  Output 880 ml  Net -400 ml   Filed Weights   08/15/20 0908 08/16/20 1200  Weight: 90.7 kg 89.5 kg    Examination:  General exam: Alert and comfortable, no distress Respiratory system: Clear to auscultation bilaterally, no wheezing or rhonchi no tachypnea Cardiovascular system: S1-S2 heard, regular rate rhythm, no JVD, no pedal edema. Gastrointestinal system: Abdomen is soft, nontender, nondistended, bowel sounds normal. Central nervous system: Alert, able to follow commands, moving all extremities Extremities: n no pedal edema or cyanosis Skin: No rashes seen, stage I  pressure injury on the right and left buttocks Psychiatry: Mood is appropriate.    Data Reviewed: I have personally reviewed following labs and imaging studies  CBC: Recent Labs  Lab 08/15/20 0931 08/16/20 1618 08/19/20 1032  WBC 19.0* 12.8* 9.7  NEUTROABS 15.5* 9.9* 7.0  HGB 13.9 14.2 14.2  HCT 43.6 44.2 44.3  MCV 95.0 94.4 94.9  PLT 309 279 096    Basic Metabolic Panel: Recent Labs  Lab 08/15/20 0931 08/16/20 1618 08/19/20 1032  NA 141 142 142  K 4.1 4.0 4.0  CL 108 108 106  CO2 24 23 24   GLUCOSE 162* 108* 128*  BUN 16 12 13   CREATININE 1.22 0.87 0.97  CALCIUM 8.7* 8.6* 8.6*    GFR: Estimated Creatinine Clearance: 53 mL/min (by C-G formula based on SCr of 0.97 mg/dL).  Liver Function Tests: Recent Labs  Lab 08/15/20 0931 08/16/20 1618  AST 26 30  ALT 29 31  ALKPHOS 57 54  BILITOT 1.1 0.9  PROT 7.3 7.2  ALBUMIN  3.0* 2.8*    CBG: No results for input(s): GLUCAP in the last 168 hours.   Recent Results (from the past 240 hour(s))  Urine culture     Status: Abnormal   Collection Time: 08/15/20  9:04 AM   Specimen: In/Out Cath Urine  Result Value Ref Range Status   Specimen Description IN/OUT CATH URINE  Final   Special Requests   Final    NONE Performed at [facility], [address] [place], [place] [address]    Culture MULTIPLE SPECIES PRESENT, SUGGEST RECOLLECTION (A)  Final   Report Status 08/16/2020 FINAL  Final  Blood culture (routine single)     Status: None   Collection Time: 08/15/20  9:33 AM   Specimen: BLOOD  Result Value Ref Range Status   Specimen Description BLOOD SITE NOT SPECIFIED  Final   Special Requests   Final    BOTTLES DRAWN AEROBIC AND ANAEROBIC Blood Culture adequate volume   Culture   Final    NO GROWTH 5 DAYS Performed at [facility], [address] [place], [place] [address]    Report Status 08/20/2020 FINAL  Final  Culture, blood (single)     Status: None   Collection Time: 08/15/20 10:24 AM    Specimen: BLOOD  Result Value Ref Range Status   Specimen Description BLOOD LEFT ANTECUBITAL  Final   Special Requests   Final    AEROBIC BOTTLE ONLY Blood Culture results may not be optimal due to an inadequate volume of blood received in culture bottles   Culture   Final    NO GROWTH 5 DAYS Performed at [facility], [place] [address], [place], [place] [address]    Report Status 08/20/2020 FINAL  Final         Radiology Studies: No results found.      Scheduled Meds: . enoxaparin (LOVENOX) injection  40 mg Subcutaneous Q24H  . Gerhardt's butt cream   Topical BID  . mirtazapine  7.5 mg Oral QHS  . pantoprazole  40 mg Oral Q0600   Continuous Infusions: . sodium chloride Stopped (08/17/20 0040)     LOS: 5 days        Hosie Poisson, MD [facility]   To contact the attending provider between 7A-7P or the covering provider during after hours 7P-7A, please log into the web site [email or web address] and access using universal [place] password for that web site. If you do not have the password, please call the hospital operator.  08/20/2020, 4:11 PM

## 2020-08-20 NOTE — Plan of Care (Signed)
  Problem: Activity: Goal: Risk for activity intolerance will decrease Outcome: Progressing   Problem: Nutrition: Goal: Adequate nutrition will be maintained Outcome: Progressing   Problem: Safety: Goal: Ability to remain free from injury will improve Outcome: Progressing   

## 2020-08-20 NOTE — TOC Progression Note (Signed)
Transition of Care [facility]) - Progression Note    Patient Details  Name: Alec Davis MRN: [id number] Date of Birth: 09-11-1923  Transition of [facility]) CM/SW Contact  Sharlet Salina Mila Homer, LCSW Phone Number: 08/20/2020, 4:55 PM  Clinical Narrative:  CSW talked with patient's wife and daughter Treshawn Allen at bedside regarding [name]'s discharge plan. Daughter reported that her parents live on-site in [place] at [place], and she has talked with admissions director and they will have a bed on Thursday. CSW advised by Ms. Doublin that both of her parents have had COVID and were quarantined for 2 weeks.   Daughter reported that she lives in [place] and her brother lives in [place], [place].    4:53 pm: CSW talked with admissions director, [name] at [facility] regarding patient's discharge and confirmed that she won't have a bed until Thursday. She needs a COVID test within 24 hours of discharge.    Expected Discharge Plan: [place]  Barriers to Discharge: Continued Medical Work up  Expected Discharge Plan and Services Expected Discharge Plan: [place] In-house Referral: Clinical Social Work     Living arrangements for the past 2 months: [place]                                       Social Determinants of Health (SDOH) Interventions    Readmission Risk Interventions No flowsheet data found.

## 2020-08-20 NOTE — Progress Notes (Signed)
Physical Therapy Treatment Patient Details Name: Alec Davis MRN: [id number] DOB: 1923-04-27 Today's Date: 08/20/2020    History of Present Illness pt is a 86 yo male who presents to the ED with weakness and a dry cough. Pt was found to have sepsis related to pneumonia. Pt with recent COVID diagnosis 3 weeks ago. Pt has a PMH of HTN, HLD, Prostate cancer, BPH and possible dementia    PT Comments    Continuing work on functional mobility and activity tolerance;  Notably better participation today, and he was able to walk in the hallway; better standing tolerance as well, able to stand with minguard assist -- long enough to give his daughter a hug in standing; these improvements bode well for a post-acute rehab stay  Follow Up Recommendations  SNF     Equipment Recommendations  Rolling walker with 5" wheels;3in1 (PT)    Recommendations for Other Services       Precautions / Restrictions Precautions Precautions: Fall    Mobility  Bed Mobility Overal bed mobility: Needs Assistance Bed Mobility: Supine to Sit     Supine to sit: Min assist;HOB elevated     General bed mobility comments: Lots of encouragement to participate; Min handheld assist to pull fully to sit  Transfers Overall transfer level: Needs assistance Equipment used: Rolling walker (2 wheeled) Transfers: Sit to/from Stand Sit to Stand: Mod assist         General transfer comment: Light mod assist to power up to stand  Ambulation/Gait Ambulation/Gait assistance: Min assist;Mod assist Gait Distance (Feet): 45 Feet Assistive device: Rolling walker (2 wheeled) Gait Pattern/deviations: Step-through pattern;Decreased step length - right;Decreased step length - left;Trunk flexed     General Gait Details: Cues for safety and RW proximity, as well as for upright posture; much improved participation   Stairs             Wheelchair Mobility    Modified Rankin (Stroke Patients Only)        Balance     Sitting balance-Leahy Scale: Fair       Standing balance-Leahy Scale: Poor                              Cognition Arousal/Alertness: Awake/alert Behavior During Therapy: WFL for tasks assessed/performed Overall Cognitive Status: History of cognitive impairments - at baseline                                 General Comments: dementia at baseline. Decreases initiation. Son reports pt gets in a comfortable position and doesn't want to move out of it.       Exercises      General Comments        Pertinent Vitals/Pain Pain Assessment: Faces Faces Pain Scale: No hurt    Home Living                      Prior Function            PT Goals (current goals can now be found in the care plan section) Acute Rehab PT Goals Patient Stated Goal: agrees to getting OOB PT Goal Formulation: With patient/family Time For Goal Achievement: 08/30/20 Potential to Achieve Goals: Fair Progress towards PT goals: Progressing toward goals    Frequency    Min 3X/week      PT Plan Current plan  remains appropriate    Co-evaluation              AM-PAC PT "6 Clicks" Mobility   Outcome Measure  Help needed turning from your back to your side while in a flat bed without using bedrails?: A Little Help needed moving from lying on your back to sitting on the side of a flat bed without using bedrails?: A Little Help needed moving to and from a bed to a chair (including a wheelchair)?: A Little Help needed standing up from a chair using your arms (e.g., wheelchair or bedside chair)?: A Little Help needed to walk in hospital room?: A Lot Help needed climbing 3-5 steps with a railing? : A Lot 6 Click Score: 16    End of Session Equipment Utilized During Treatment: Gait belt Activity Tolerance: Patient tolerated treatment well Patient left: in chair;with call bell/phone within reach;with chair alarm set Nurse Communication: Mobility  status PT Visit Diagnosis: Unsteadiness on feet (R26.81);Muscle weakness (generalized) (M62.81);Difficulty in walking, not elsewhere classified (R26.2)     Time: 1980-2217 PT Time Calculation (min) (ACUTE ONLY): 26 min  Charges:  $Gait Training: 23-37 mins                     Roney Marion, [place]  Acute Rehabilitation Services Pager [phone] Office [place] 08/20/2020, 3:38 PM

## 2020-08-21 MED ORDER — ENSURE ENLIVE PO LIQD
237.0000 mL | Freq: Three times a day (TID) | ORAL | Status: DC
  Administered 2020-08-21 – 2020-08-22 (×3): 237 mL via ORAL

## 2020-08-21 MED ORDER — ADULT MULTIVITAMIN W/MINERALS CH
1.0000 | ORAL_TABLET | Freq: Every day | ORAL | Status: DC
  Administered 2020-08-21 – 2020-08-22 (×2): 1 via ORAL
  Filled 2020-08-21 (×2): qty 1

## 2020-08-21 NOTE — Progress Notes (Addendum)
[facility] ([facility])  Hospital Liaison: RN note       Notified by TOC manager of patient/family request for [facility] Palliative services at home after discharge.               [facility] Palliative team will follow up with patient after discharge.       Please call with any hospice or palliative related questions.       Thank you for this referral.       Melissa O'Bryant, BSN, RN [facility] Hospital Liaison (listed on AMION under [facility] [place])   [phone]    

## 2020-08-21 NOTE — Progress Notes (Signed)
PROGRESS NOTE    Alec Davis  QIW:[id number] DOB: 04-13-1923 DOA: 08/15/2020 PCP: [name], DO   Chief Complaint  Patient presents with  . Weakness    Brief Narrative:   86 year old gentleman with prior history of hypertension hyperlipidemia diagnosed with Covid 3 weeks ago, dementia, BPH presented to ED with generalized to ED weakness and persistent cough from independent living facility.  Patient reports loss of appetite in addition to this on arrival to ED he was found to be afebrile, mildly tachypneic, leukocytosis with elevated lactic acid.  CT of the abdomen and pelvis showed bibasilar heterogeneous opacity and consolidation concerning for possible infection versus aspiration.  Patient was initially started on IV vancomycin, metronidazole and cefepime and IV fluids and referred to [facility] for admission.transitioned broad spectrum IV antibiotics to rocephin and zithromax. PT evaluation recommending SNF.  Palliative care consulted and recommendations to follow up as outpatient.  Daughter worried that patient is not eating well, he was started on Remeron and protonix.  Dietary consulted for nutritional supplements.   Assessment & Plan:   SIRS likely from pneumonia:  - sepsis ruled out.,  Diagnosed with Covid over 3 weeks prior to admission -Upon admission he was empirically started on azithromycin and ceftriaxone -Clinically improved and stabilized, completed antibiotic course -No residual symptoms at this time  Dementia without behavioral abnormalities:  -Stable without sitter  -Remains pleasant, at risk of delirium  -Also seen by palliative care this admission, recommend outpatient follow up with hospice if he does not improve with physical therapy.   Recent COVID 19 infection:  Reported negative test at the facility on 8/23.  Anorexia Probably from the COVID 19 illness.   CT abd ruled out acute intra abdominal pathology.  Encourage oral intake.   Started the  patient on Remeron for appetite stimulation, insomnia.  Dietary consulted nutritional supplementation.  Pressure injury on admission:  Pressure Injury 08/16/20 Buttocks Right Stage 2 -  Partial thickness loss of dermis presenting as a shallow open injury with a red, pink wound bed without slough. (Active)  08/16/20 0820  Location: Buttocks  Location Orientation: Right  Staging: Stage 2 -  Partial thickness loss of dermis presenting as a shallow open injury with a red, pink wound bed without slough.  Wound Description (Comments):   Present on Admission: Yes     Pressure Injury 08/16/20 Buttocks Left Stage 1 -  Intact skin with non-blanchable redness of a localized area usually over a bony prominence. (Active)  08/16/20 0820  Location: Buttocks  Location Orientation: Left  Staging: Stage 1 -  Intact skin with non-blanchable redness of a localized area usually over a bony prominence.  Wound Description (Comments):   Present on Admission: Yes   Wound care consulted and recommendations given. .    Due to poor progression, multiple co morbidities, dementia, cognitive deficits, clinical decline, palliative care consulted for goals of care discussion.  Outpatient palliative follow up .   PT evaluation recommending home health PT vs [facility] Currently waiting for SNF placement.  DVT prophylaxis:  LOVENOX.  Code Status: Full code.  Family Communication: . Spoke to daughter at bedside.  Disposition:   Status is: Inpatient  Remains inpatient appropriate because:Unsafe d/c plan   Dispo: The patient is from: Home              Anticipated d/c is to: SNF              Anticipated d/c  date is: 9/2              Patient currently is medically stable to d/c.       Consultants:   Wound care  Palliative care.   Procedures: None Antimicrobials: none.   Subjective: No new complaints today  Objective: Vitals:   08/20/20 1649 08/20/20 2141 08/21/20 0601  08/21/20 0929  BP: (!) 168/93 129/81 135/68 119/60  Pulse: 79 78 74 79  Resp: 18 18 19 18   Temp: 98 F (36.7 C) 97.9 F (36.6 C) 97.6 F (36.4 C) 97.8 F (36.6 C)  TempSrc:  Oral Oral Oral  SpO2: 100% 96% 96% 94%  Weight:      Height:        Intake/Output Summary (Last 24 hours) at 08/21/2020 1546 Last data filed at 08/21/2020 0900 Gross per 24 hour  Intake 360 ml  Output 225 ml  Net 135 ml   Filed Weights   08/15/20 0908 08/16/20 1200  Weight: 90.7 kg 89.5 kg    Examination:  General exam: Elderly frail pleasant male sitting up in bed, awake alert, oriented to self and place, cognitive deficits and hard of hearing HEENT: No JVD CVS: S1-S2, regular rate rhythm Lungs: Few basilar rales Abdomen: Soft, nontender, bowel sounds present Extremities: No edema Skin: No rashes on exposed skin,  stage I pressure injury on the right and left buttocks Psychiatry: Poor insight and judgment, overall pleasant   Data Reviewed: I have personally reviewed following labs and imaging studies  CBC: Recent Labs  Lab 08/15/20 0931 08/16/20 1618 08/19/20 1032  WBC 19.0* 12.8* 9.7  NEUTROABS 15.5* 9.9* 7.0  HGB 13.9 14.2 14.2  HCT 43.6 44.2 44.3  MCV 95.0 94.4 94.9  PLT 309 279 591    Basic Metabolic Panel: Recent Labs  Lab 08/15/20 0931 08/16/20 1618 08/19/20 1032  NA 141 142 142  K 4.1 4.0 4.0  CL 108 108 106  CO2 24 23 24   GLUCOSE 162* 108* 128*  BUN 16 12 13   CREATININE 1.22 0.87 0.97  CALCIUM 8.7* 8.6* 8.6*    GFR: Estimated Creatinine Clearance: 53 mL/min (by C-G formula based on SCr of 0.97 mg/dL).  Liver Function Tests: Recent Labs  Lab 08/15/20 0931 08/16/20 1618  AST 26 30  ALT 29 31  ALKPHOS 57 54  BILITOT 1.1 0.9  PROT 7.3 7.2  ALBUMIN 3.0* 2.8*    CBG: No results for input(s): GLUCAP in the last 168 hours.   Recent Results (from the past 240 hour(s))  Urine culture     Status: Abnormal   Collection Time: 08/15/20  9:04 AM   Specimen:  In/Out Cath Urine  Result Value Ref Range Status   Specimen Description IN/OUT CATH URINE  Final   Special Requests   Final    NONE Performed at [facility], [address] [place], [place] [address]    Culture MULTIPLE SPECIES PRESENT, SUGGEST RECOLLECTION (A)  Final   Report Status 08/16/2020 FINAL  Final  Blood culture (routine single)     Status: None   Collection Time: 08/15/20  9:33 AM   Specimen: BLOOD  Result Value Ref Range Status   Specimen Description BLOOD SITE NOT SPECIFIED  Final   Special Requests   Final    BOTTLES DRAWN AEROBIC AND ANAEROBIC Blood Culture adequate volume   Culture   Final    NO GROWTH 5 DAYS Performed at [facility], [address] [place],  [place] [address]    Report Status 08/20/2020 FINAL  Final  Culture, blood (single)     Status: None   Collection Time: 08/15/20 10:24 AM   Specimen: BLOOD  Result Value Ref Range Status   Specimen Description BLOOD LEFT ANTECUBITAL  Final   Special Requests   Final    AEROBIC BOTTLE ONLY Blood Culture results may not be optimal due to an inadequate volume of blood received in culture bottles   Culture   Final    NO GROWTH 5 DAYS Performed at [facility], [place] [address], [place], [place] [address]    Report Status 08/20/2020 FINAL  Final         Radiology Studies: No results found.      Scheduled Meds: . enoxaparin (LOVENOX) injection  40 mg Subcutaneous Q24H  . feeding supplement (ENSURE ENLIVE)  237 mL Oral TID BM  . Gerhardt's butt cream   Topical BID  . mirtazapine  7.5 mg Oral QHS  . multivitamin with minerals  1 tablet Oral Daily  . pantoprazole  40 mg Oral Q0600   Continuous Infusions: . sodium chloride Stopped (08/17/20 0040)     LOS: 6 days   Domenic Polite, MD [facility] 08/21/2020, 3:46 PM

## 2020-08-21 NOTE — Progress Notes (Signed)
Physical Therapy Treatment Patient Details Name: Alec Davis MRN: [id number] DOB: 16-Oct-1923 Today's Date: 08/21/2020    History of Present Illness pt is a 86 yo male who presents to the ED with weakness and a dry cough. Pt was found to have sepsis related to pneumonia. Pt with recent COVID diagnosis 3 weeks ago. Pt has a PMH of HTN, HLD, Prostate cancer, BPH and possible dementia    PT Comments    Continuing work on functional mobility and activity tolerance;  Pt needing more encouragement this session to get up, and ultimately he agreed to get OOB for lunch; Needed more assist for anterior weight shift and power up today; continue to agree with SNF for post-acute rehab  Follow Up Recommendations  SNF     Equipment Recommendations  Rolling walker with 5" wheels;3in1 (PT)    Recommendations for Other Services       Precautions / Restrictions Precautions Precaution Comments: posterior bias in standing    Mobility  Bed Mobility Overal bed mobility: Needs Assistance Bed Mobility: Supine to Sit     Supine to sit: Min assist;HOB elevated     General bed mobility comments: Lots of encouragement to participate; Min handheld assist to pull fully to sit; used bed pad to square off hips at EOB  Transfers Overall transfer level: Needs assistance Equipment used: Rolling walker (2 wheeled) Transfers: Sit to/from [facility] Sit to Stand: Mod assist Stand pivot transfers: Mod assist       General transfer comment: Mod assist to pwer up and help with anterior weight shift with pivotal steps bed to chair  Ambulation/Gait             General Gait Details: Politely declined amb today   Stairs             Wheelchair Mobility    Modified Rankin (Stroke Patients Only)       Balance     Sitting balance-Leahy Scale: Fair       Standing balance-Leahy Scale: Poor                              Cognition Arousal/Alertness:  Awake/alert Behavior During Therapy: WFL for tasks assessed/performed Overall Cognitive Status: History of cognitive impairments - at baseline                                 General Comments: dementia at baseline. Decreases initiation. Son reports pt gets in a comfortable position and doesn't want to move out of it.       Exercises      General Comments        Pertinent Vitals/Pain Pain Assessment: Faces Faces Pain Scale: No hurt    Home Living                      Prior Function            PT Goals (current goals can now be found in the care plan section) Acute Rehab PT Goals Patient Stated Goal: Needed encouragement to agree to getting OOB PT Goal Formulation: With patient/family Time For Goal Achievement: 08/30/20 Potential to Achieve Goals: Fair Progress towards PT goals: Not progressing toward goals - comment (feeling tired and needed lots of persuasion just to get OOB)    Frequency    Min 3X/week  PT Plan Current plan remains appropriate    Co-evaluation              AM-PAC PT "6 Clicks" Mobility   Outcome Measure  Help needed turning from your back to your side while in a flat bed without using bedrails?: A Little Help needed moving from lying on your back to sitting on the side of a flat bed without using bedrails?: A Little Help needed moving to and from a bed to a chair (including a wheelchair)?: A Little Help needed standing up from a chair using your arms (e.g., wheelchair or bedside chair)?: A Little Help needed to walk in hospital room?: A Lot Help needed climbing 3-5 steps with a railing? : Total 6 Click Score: 15    End of Session Equipment Utilized During Treatment: Gait belt Activity Tolerance: Patient tolerated treatment well Patient left: in chair;with call bell/phone within reach;with chair alarm set Nurse Communication: Mobility status PT Visit Diagnosis: Unsteadiness on feet (R26.81);Muscle weakness  (generalized) (M62.81);Difficulty in walking, not elsewhere classified (R26.2)     Time: 6016-5800 PT Time Calculation (min) (ACUTE ONLY): 14 min  Charges:  $Therapeutic Activity: 8-22 mins                     Roney Marion, PT  Acute Rehabilitation Services Pager [phone] Office [place] 08/21/2020, 2:02 PM

## 2020-08-21 NOTE — Progress Notes (Signed)
Initial Nutrition Assessment  DOCUMENTATION CODES:   Severe malnutrition in context of acute illness/injury  INTERVENTION:    Ensure Enlive po TID, each supplement provides 350 kcal and 20 grams of protein.  MVI with minerals daily.  NUTRITION DIAGNOSIS:   Severe Malnutrition related to acute illness (COVID-19) as evidenced by severe muscle depletion, severe fat depletion, percent weight loss (9% weight loss within 3 months).  GOAL:   Patient will meet greater than or equal to 90% of their needs  MONITOR:   PO intake, Supplement acceptance  REASON FOR ASSESSMENT:   Consult Assessment of nutrition requirement/status  ASSESSMENT:   86 yo male admitted from ILF with generalized weakness and persistent cough. PMH includes HTN, HLD, COVID-19 3 weeks PTA, dementia, BPH.  Spoke with patient and his daughter, [name] at bedside. [name] thinks he has lost around 20 lbs. He is typically a "big eater" but has had minimal intake since COVID-19 diagnosis 3 weeks ago. Likely related to taste changes/loss of taste & smell. Daughter has brought him in Boost supplements and has been trying to encourage patient to drink them. Patient likes chocolate flavor.  Usual weights reviewed. Patient has lost 9% of his usual weight in the past 2.5 months. He was 98.4 kg on 06/04/20, 89.5 kg on admission 8/27.  Labs reviewed.  Medications reviewed and include Remeron.  Patient meets criteria for severe malnutrition with severe depletion of muscle and subcutaneous fat mass and 9% weight loss within 3 months.   NUTRITION - FOCUSED PHYSICAL EXAM:    Most Recent Value  Orbital Region Severe depletion  Upper Arm Region Moderate depletion  Thoracic and Lumbar Region Mild depletion  Buccal Region Severe depletion  Temple Region Severe depletion  Clavicle Bone Region Moderate depletion  Clavicle and Acromion Bone Region Moderate depletion  Scapular Bone Region Unable to assess  Dorsal Hand Moderate  depletion  Patellar Region Moderate depletion  Anterior Thigh Region Moderate depletion  Posterior Calf Region Severe depletion  Edema (RD Assessment) None  Hair Reviewed  Eyes Reviewed  Mouth Reviewed  Skin Reviewed  Nails Reviewed       Diet Order:   Diet Order            DIET SOFT Room service appropriate? Yes with Assist; Fluid consistency: Thin  Diet effective now                 EDUCATION NEEDS:   Education needs have been addressed  Skin:  Skin Assessment: Skin Integrity Issues: Skin Integrity Issues:: Stage II, Stage I Stage I: L buttocks Stage II: R buttocks  Last BM:  8/26  Height:   Ht Readings from Last 1 Encounters:  08/16/20 6\' 2"  (1.88 m)    Weight:   Wt Readings from Last 1 Encounters:  08/16/20 89.5 kg    Ideal Body Weight:  86.4 kg  BMI:  Body mass index is 25.33 kg/m.  Estimated Nutritional Needs:   Kcal:  2000-2200  Protein:  115-130 gm  Fluid:  2-2.2 L    Lucas Mallow, RD, LDN, CNSC Please refer to Amion for contact information.

## 2020-08-21 NOTE — TOC Progression Note (Signed)
Transition of Care [facility]) - Progression Note    Patient Details  Name: Alec Davis MRN: [id number] Date of Birth: 1923-06-29  Transition of Care [facility]) CM/SW Contact  Bartholomew Crews, RN Phone Number: [phone] 08/21/2020, 4:31 PM  Clinical Narrative:     Notified by palliative team of patient/family agreeable to outpatient palliative. [facility] notified and aware d/t previous hospice referral. TOC following for transition needs.   Expected Discharge Plan: [place] Barriers to Discharge: Continued Medical Work up  Expected Discharge Plan and Services Expected Discharge Plan: [place] In-house Referral: Clinical Social Work     Living arrangements for the past 2 months: [place]                                       Social Determinants of Health (SDOH) Interventions    Readmission Risk Interventions No flowsheet data found.

## 2020-08-21 NOTE — Progress Notes (Signed)
Occupational Therapy Treatment Patient Details Name: Alec Davis MRN: [id number] DOB: 1923/02/03 Today's Date: 08/21/2020    History of present illness pt is a 86 yo male who presents to the ED with weakness and a dry cough. Pt was found to have sepsis related to pneumonia. Pt with recent COVID diagnosis 3 weeks ago. Pt has a PMH of HTN, HLD, Prostate cancer, BPH and possible dementia   OT comments  Pt received in bed, awake and alert, disoriented to day and place. Initially agreeable to EOB there ex for improved strength for ADL engagement, then refuses but agreeable to bed level exercises. Pt then refuses to continue with exercises after completion of 2, OT attempted with encouragement and education for importance of OOB movement, pt adamant on not moving out bed because he is settled. OT will continue to attempt to see pt for established deficits to promote increased independence prior to return to home with [place].    Follow Up Recommendations  Home health OT;Supervision/Assistance - 24 hour    Equipment Recommendations  None recommended by OT    Recommendations for Other Services      Precautions / Restrictions Precautions Precautions: Fall Precaution Comments: posterior bias in standing Restrictions Weight Bearing Restrictions: No       Mobility Bed Mobility Overal bed mobility: Needs Assistance Bed Mobility: Supine to Sit     Supine to sit: Min assist;HOB elevated     General bed mobility comments: deferred due to pt refusal of OOB ADL engagement even with encouragement.  Transfers Overall transfer level: Needs assistance Equipment used: Rolling walker (2 wheeled) Transfers: Sit to/from [facility] Sit to Stand: Mod assist Stand pivot transfers: Mod assist       General transfer comment: deferred    Balance Overall balance assessment: Needs assistance Sitting-balance support: Feet supported;No upper extremity supported Sitting  balance-Leahy Scale: Fair Sitting balance - Comments: immediately reaching for rw to stand, immediately laying back down after standing breifly   Standing balance support: Bilateral upper extremity supported Standing balance-Leahy Scale: Poor Standing balance comment: stood about 15 seconds.                            ADL either performed or assessed with clinical judgement   ADL Overall ADL's : Needs assistance/impaired                                       General ADL Comments: Pt initially agreeable to EOB ther ex for increased UE strength with ADLs and AE, however, once given direction to move towards EOB pt declined and agreed to bed level ther ex. Once performing 2 exercises, pt refuses to continue with more exercises     Vision       Perception     Praxis      Cognition Arousal/Alertness: Awake/alert Behavior During Therapy: WFL for tasks assessed/performed Overall Cognitive Status: History of cognitive impairments - at baseline Area of Impairment: Orientation;Problem solving                 Orientation Level: Disoriented to;Place;Time;Situation           Problem Solving: Decreased initiation;Slow processing General Comments: dementia at baseline. Decreases initiation. Son reports pt gets in a comfortable position and doesn't want to move out of it.  As evidenced by refusal of OOB ADL  or ther ex even with max encouragement.         Exercises     Shoulder Instructions       General Comments      Pertinent Vitals/ Pain       Pain Assessment: Faces Faces Pain Scale: No hurt  Home Living                                          Prior Functioning/Environment              Frequency  Min 2X/week        Progress Toward Goals  OT Goals(current goals can now be found in the care plan section)  Progress towards OT goals:  (unable to assess due to decline of OOB tasks.)  Acute Rehab OT  Goals Patient Stated Goal: Needed encouragement to agree to getting OOB OT Goal Formulation: With patient/family Time For Goal Achievement: 08/31/20 Potential to Achieve Goals: Good ADL Goals Pt Will Perform Upper Body Dressing: with min assist;sitting Pt Will Perform Lower Body Dressing: with min assist;sit to/from stand Pt Will Transfer to Toilet: with min assist;ambulating Pt Will Perform Toileting - Clothing Manipulation and hygiene: with min assist;sit to/from stand Additional ADL Goal #1: Pt will complete bed mobility at supervision level to prepare for EOB/OOB ADLs.  Plan Discharge plan remains appropriate    Co-evaluation                 AM-PAC OT "6 Clicks" Daily Activity     Outcome Measure   Help from another person eating meals?: A Little Help from another person taking care of personal grooming?: A Little Help from another person toileting, which includes using toliet, bedpan, or urinal?: A Lot Help from another person bathing (including washing, rinsing, drying)?: A Lot Help from another person to put on and taking off regular upper body clothing?: A Little Help from another person to put on and taking off regular lower body clothing?: A Lot 6 Click Score: 15    End of Session    OT Visit Diagnosis: Unsteadiness on feet (R26.81);Pain;Muscle weakness (generalized) (M62.81)   Activity Tolerance Patient limited by fatigue   Patient Left in bed;with call bell/phone within reach;with bed alarm set   Nurse Communication          Time: 8250-0370 OT Time Calculation (min): 8 min  Charges: OT General Charges $OT Visit: 1 Visit OT Treatments $Therapeutic Exercise: 8-22 mins  Minus Breeding, MSOT, OTR/L  Supplemental Rehabilitation Services  [phone]    Marius Ditch 08/21/2020, 4:44 PM

## 2020-08-22 DIAGNOSIS — E43 Unspecified severe protein-calorie malnutrition: Secondary | ICD-10-CM | POA: Insufficient documentation

## 2020-08-22 LAB — SARS CORONAVIRUS 2 BY RT PCR (HOSPITAL ORDER, PERFORMED IN ~~LOC~~ HOSPITAL LAB): SARS Coronavirus 2: POSITIVE — AB

## 2020-08-22 MED ORDER — MIRTAZAPINE 7.5 MG PO TABS
7.5000 mg | ORAL_TABLET | Freq: Every day | ORAL | Status: DC
Start: 2020-08-22 — End: 2020-08-23

## 2020-08-22 MED ORDER — PANTOPRAZOLE SODIUM 40 MG PO TBEC
40.0000 mg | DELAYED_RELEASE_TABLET | Freq: Every day | ORAL | Status: DC
Start: 2020-08-23 — End: 2020-08-23

## 2020-08-22 MED ORDER — ENSURE ENLIVE PO LIQD: 237.0000 mL | Freq: Two times a day (BID) | ORAL | Status: DC

## 2020-08-22 NOTE — Discharge Summary (Addendum)
Physician Discharge Summary  Alec Davis POL:[id number] DOB: 1923-02-04 DOA: 08/15/2020  PCP: [name], DO  Admit date: 08/15/2020 Discharge date: 08/22/2020  Time spent: 35 minutes  Recommendations for Outpatient Follow-up:  1. PCP in 1 week 2.  Back to independent living with home health services 3. Palliative care follow-up   Discharge Diagnoses:  Principal Problem:   Community acquired pneumonia   Anorexia   Recent COVID-19 virus infection   Generalized weakness   Abdominal pain   Dementia with behavioral disturbance ([place])   Palliative care by specialist   Goals of care, counseling/discussion   DNR (do not resuscitate)   DNI (do not intubate)   Advanced directive placed in chart this admission   Advanced directives, counseling/discussion   Copy of advanced directive obtained   Sepsis ([place])   Protein-calorie malnutrition, severe   Discharge Condition: stable  Diet recommendation: low sodium   Filed Weights   08/15/20 0908 08/16/20 1200 08/21/20 2026  Weight: 90.7 kg 89.5 kg 88.9 kg    History of present illness:  86 year old gentleman with prior history of hypertension hyperlipidemia diagnosed with Covid 3 weeks ago, dementia, BPH presented to ED with generalized to ED weakness and persistent cough from independent living facility.  Patient reports loss of appetite in addition to this on arrival to ED he was found to be afebrile, mildly tachypneic, leukocytosis with elevated lactic acid.  CT of the abdomen and pelvis showed bibasilar heterogeneous opacity and consolidation concerning for possible infection versus aspiration  Hospital Course:    Community-acquired pneumonia  - sepsis ruled out.,  Diagnosed with Covid over 3 weeks prior to admission -Upon admission he was empirically started on azithromycin and ceftriaxone -Clinically improved and stabilized, completed antibiotic course -No residual symptoms at this time, weaned off oxygen  Dementia  without behavioral abnormalities:  -Stable without sitter  -Remains pleasant, at risk of delirium  -Also seen by palliative care this admission, recommend outpatient follow up with hospice if he does not improve with physical therapy.   Recent COVID 19 infection:  Per report diagnosed with Covid at his facility more than 3 weeks prior to admission and reported negative test at the facility on 8/23.  Anorexia Probably from the COVID 19 illness.   CT abd ruled out acute intra abdominal pathology.  Encourage oral intake.   Started the patient on Remeron for appetite stimulation, insomnia.  Dietary consulted nutritional supplementation.  Pressure injury on admission:  Pressure Injury 08/16/20 Buttocks Right Stage 2 -  Partial thickness loss of dermis presenting as a shallow open injury with a red, pink wound bed without slough. (Active)  08/16/20 0820  Location: Buttocks  Location Orientation: Right  Staging: Stage 2 -  Partial thickness loss of dermis presenting as a shallow open injury with a red, pink wound bed without slough.  Wound Description (Comments):   Present on Admission: Yes     Pressure Injury 08/16/20 Buttocks Left Stage 1 -  Intact skin with non-blanchable redness of a localized area usually over a bony prominence. (Active)  08/16/20 0820  Location: Buttocks  Location Orientation: Left  Staging: Stage 1 -  Intact skin with non-blanchable redness of a localized area usually over a bony prominence.  Wound Description (Comments):   Present on Admission: Yes   Wound care consulted and recommendations given. .    Due to poor progression, multiple co morbidities, dementia, cognitive deficits, clinical decline, palliative care consulted for goals of care discussion.  Outpatient palliative  follow up .     Discharge Exam: Vitals:   08/21/20 2026 08/22/20 0530  BP: 138/80 (!) 141/72  Pulse: 71 70  Resp: 20 18  Temp: 98.8 F (37.1 C) (!) 97.5 F (36.4 C)  SpO2:  96% 95%    General: AAOx2, mild cognitive deficits Cardiovascular: S1S2/RRR Respiratory: CTAB  Discharge Instructions   Discharge Instructions    Diet - low sodium heart healthy   Complete by: As directed    Discharge wound care:   Complete by: As directed    As above   Increase activity slowly   Complete by: As directed      Allergies as of 08/22/2020   No Known Allergies     Medication List    STOP taking these medications   lovastatin 20 MG tablet Commonly known as: MEVACOR     TAKE these medications   feeding supplement (ENSURE ENLIVE) Liqd Take 237 mLs by mouth 2 (two) times daily between meals.   furosemide 20 MG tablet Commonly known as: Lasix Take 1 tablet (20 mg total) by mouth daily as needed for edema.   mirtazapine 7.5 MG tablet Commonly known as: REMERON Take 1 tablet (7.5 mg total) by mouth at bedtime.   pantoprazole 40 MG tablet Commonly known as: PROTONIX Take 1 tablet (40 mg total) by mouth daily at 6 (six) AM. Start taking on: August 23, 2020            Discharge Care Instructions  (From admission, onward)         Start     Ordered   08/22/20 0000  Discharge wound care:       Comments: As above   08/22/20 1010         No Known Allergies    The results of significant diagnostics from this hospitalization (including imaging, microbiology, ancillary and laboratory) are listed below for reference.    Significant Diagnostic Studies: CT Abdomen Pelvis W Contrast  Result Date: 08/15/2020 CLINICAL DATA:  Epigastric pain, right upper quadrant pain and tenderness, history of gallstone ileus EXAM: CT ABDOMEN AND PELVIS WITH CONTRAST TECHNIQUE: Multidetector CT imaging of the abdomen and pelvis was performed using the standard protocol following bolus administration of intravenous contrast. CONTRAST:  159mL OMNIPAQUE IOHEXOL 300 MG/ML  SOLN COMPARISON:  08/02/2019 FINDINGS: Lower chest: Heterogeneous airspace opacity and consolidation  of the dependent bilateral lung bases. Three-vessel coronary artery calcifications. Mitral annulus calcifications. Aortic valve calcifications. Aortic atherosclerosis. Hepatobiliary: No solid liver abnormality is seen. Hepatic steatosis. Status post cholecystectomy. No biliary ductal dilatation Pancreas: Unremarkable. No pancreatic ductal dilatation or surrounding inflammatory changes. Spleen: Normal in size without significant abnormality. Adrenals/Urinary Tract: Stable, definitively benign left adrenal adenoma. Benign exophytic cyst of the posterior right kidney. Kidneys are normal, without renal calculi, solid lesion, or hydronephrosis. Tiny calculi in the dependent urinary bladder (series 3, image 96). Stomach/Bowel: Stomach is within normal limits. Appendix appears normal. No evidence of bowel wall thickening, distention, or inflammatory changes. Descending and sigmoid diverticulosis. Vascular/Lymphatic: No significant vascular findings are present. No enlarged abdominal or pelvic lymph nodes. Reproductive: Prostatomegaly. Other: No abdominal wall hernia or abnormality. No abdominopelvic ascites. Musculoskeletal: No acute or significant osseous findings. IMPRESSION: 1. No acute CT findings within the abdomen or pelvis to explain epigastric pain. 2. Dependent bibasilar heterogeneous airspace opacity and consolidation, concerning for infection or aspiration. 3. Hepatic steatosis. 4. Descending and sigmoid diverticulosis without evidence of acute diverticulitis. 5. Tiny calculi in the dependent urinary bladder. No  ureteral calculi. No hydronephrosis. 6. Prostatomegaly. 7. Coronary artery disease.  Aortic Atherosclerosis (ICD10-I70.0). Electronically Signed   By: Eddie Candle M.D.   On: 08/15/2020 13:27   DG Chest Port 1 View  Result Date: 08/15/2020 CLINICAL DATA:  Questionable sepsis. EXAM: PORTABLE CHEST 1 VIEW COMPARISON:  August 02, 2019 FINDINGS: Trachea is midline. Cardiomediastinal contours are stable.  Lung volumes are low. No consolidation or pleural effusion. On limited assessment skeletal structures without acute process. Multiple leads overlie the chest also limiting assessment. IMPRESSION: Low lung volumes without acute cardiopulmonary process. Electronically Signed   By: Zetta Bills M.D.   On: 08/15/2020 09:29    Microbiology: Recent Results (from the past 240 hour(s))  Urine culture     Status: Abnormal   Collection Time: 08/15/20  9:04 AM   Specimen: In/Out Cath Urine  Result Value Ref Range Status   Specimen Description IN/OUT CATH URINE  Final   Special Requests   Final    NONE Performed at [facility], [address], [place], [place] [address]    Culture MULTIPLE SPECIES PRESENT, SUGGEST RECOLLECTION (A)  Final   Report Status 08/16/2020 FINAL  Final  Blood culture (routine single)     Status: None   Collection Time: 08/15/20  9:33 AM   Specimen: BLOOD  Result Value Ref Range Status   Specimen Description BLOOD SITE NOT SPECIFIED  Final   Special Requests   Final    BOTTLES DRAWN AEROBIC AND ANAEROBIC Blood Culture adequate volume   Culture   Final    NO GROWTH 5 DAYS Performed at [facility], [address], [place], [place] [address]    Report Status 08/20/2020 FINAL  Final  Culture, blood (single)     Status: None   Collection Time: 08/15/20 10:24 AM   Specimen: BLOOD  Result Value Ref Range Status   Specimen Description BLOOD LEFT ANTECUBITAL  Final   Special Requests   Final    AEROBIC BOTTLE ONLY Blood Culture results may not be optimal due to an inadequate volume of blood received in culture bottles   Culture   Final    NO GROWTH 5 DAYS Performed at [facility], [place] [address] [place], [place] [address]    Report Status 08/20/2020 FINAL  Final     Labs: Basic Metabolic Panel: Recent Labs  Lab 08/16/20 1618 08/19/20 1032  NA 142 142  K 4.0 4.0  CL 108 106  CO2 23 24  GLUCOSE 108* 128*  BUN 12 13  CREATININE 0.87 0.97   CALCIUM 8.6* 8.6*   Liver Function Tests: Recent Labs  Lab 08/16/20 1618  AST 30  ALT 31  ALKPHOS 54  BILITOT 0.9  PROT 7.2  ALBUMIN 2.8*   No results for input(s): LIPASE, AMYLASE in the last 168 hours. No results for input(s): AMMONIA in the last 168 hours. CBC: Recent Labs  Lab 08/16/20 1618 08/19/20 1032  WBC 12.8* 9.7  NEUTROABS 9.9* 7.0  HGB 14.2 14.2  HCT 44.2 44.3  MCV 94.4 94.9  PLT 279 260   Cardiac Enzymes: No results for input(s): CKTOTAL, CKMB, CKMBINDEX, TROPONINI in the last 168 hours. BNP: BNP (last 3 results) No results for input(s): BNP in the last 8760 hours.  ProBNP (last 3 results) No results for input(s): PROBNP in the last 8760 hours.  CBG: No results for input(s): GLUCAP in the last 168 hours.  Signed:  Domenic Polite MD.  [facility]  08/22/2020, 10:14 AM

## 2020-08-22 NOTE — Care Management Important Message (Signed)
Important Message  Patient Details  Name: Alec Davis MRN: [id number] Date of Birth: 08/30/1923   Medicare Important Message Given:  Yes - Important Message mailed due to current National Emergency  Verbal consent obtained due to current National Emergency  Relationship to patient: Child Contact Name: Leondro Coryell Call Date: 08/22/20  Time: 1257 Phone: [phone] Outcome: Spoke with contact Important Message mailed to: Patient address on file    [name] 08/22/2020, 12:58 PM

## 2020-08-22 NOTE — Progress Notes (Signed)
DISCHARGE NOTE [facility] to be discharged Skilled nursing facility per MD order. Patient verbalized understanding.  Skin clean, dry and intact without evidence of skin break down, no evidence of skin tears noted. IV catheter discontinued intact. Site without signs and symptoms of complications. Dressing and pressure applied. Pt denies pain at the site currently. No complaints noted.  Patient free of lines, drains, and wounds.   Discharge packet assembled. An After Visit Summary (AVS) was printed and given to the EMS personnel. Patient escorted via stretcher and discharged to [facility] via ambulance. Report called to accepting facility; all questions and concerns addressed.   Dorthey Sawyer, RN

## 2020-08-22 NOTE — Progress Notes (Signed)
    Durable Medical Equipment  (From admission, onward)         Start     Ordered   08/22/20 1139  For home use only DME 3 n 1  Once        08/22/20 1139   08/22/20 1136  For home use only DME Hospital bed  Once       Question Answer Comment  Length of Need 6 Months   Patient has (list medical condition): sepsis pneumonia - weakness - covid 19   The above medical condition requires: Patient requires the ability to reposition frequently   Head must be elevated greater than: 30 degrees   Bed type Semi-electric   Support Surface: Gel Overlay      08/22/20 1139

## 2020-08-22 NOTE — TOC Transition Note (Addendum)
Transition of Care [facility]) - CM/SW Discharge Note   Patient Details  Name: KYLLE LALL MRN: [id number] Date of Birth: 19-Jul-1923  Transition of [facility] CM/SW Contact:  Sable Feil, LCSW Phone Number: 08/22/2020, 1:04 PM   Clinical Narrative:  Patient medically stable for discharge and initially was discharging to [facility] skilled nursing facility. CSW was contacted today by Carmelina Paddock, Director of [facility] that the [facility] facility has 2 positive COVID residents, and new patient admissions would be quarantined for a month, so decision was made for patient to return home with [facility] services and DME equipment. Nurse case manager Wendi joined the conversation and got the information necessary to set-up [facility] and order equipment. Patient will be transported home by non-emergency ambulance transport. Daughter requested ambulance transport.   Final next level of care: [place] Barriers to Discharge: Barriers Resolved   Patient Goals and CMS Choice Patient states their goals for this hospitalization and ongoing recovery are:: Patient and family desire that patient get medically better before returning home CMS Medicare.gov Compare Post Acute Care list provided to:: Other (Comment Required) (Was not needed as patient was foing to [facility] SNF, but is now going home) Choice offered to / list presented to : NA  Discharge Placement - Home with [facility] services and DME              Patient chooses bed at: Other - please specify in the comment section below: (Patient dischargng home) Patient to be transferred to facility by: Non- emergency transport - [facility] Name of family member notified: Daughter Gretta Arab - [phone] Patient and family notified of of transfer: 08/22/20  Discharge Plan and Services In-house Referral: Clinical Social Work                DME Agency: [facility] Date DME Agency Contacted: 08/22/20 Time DME  Agency Contacted: 0349 (11:42 am) Representative spoke with at DME Agency: Freda Munro            Social Determinants of Health ([place]) Interventions  No SDOH interventions requested or needed at discharge   Readmission Risk Interventions No flowsheet data found.

## 2020-08-23 ENCOUNTER — Telehealth: Payer: Self-pay | Admitting: *Deleted

## 2020-08-23 DIAGNOSIS — R600 Localized edema: Secondary | ICD-10-CM

## 2020-08-23 MED ORDER — PANTOPRAZOLE SODIUM 40 MG PO TBEC: 40.0000 mg | DELAYED_RELEASE_TABLET | Freq: Every day | ORAL | 2 refills | Status: AC

## 2020-08-23 MED ORDER — MIRTAZAPINE 7.5 MG PO TABS: 7.5000 mg | ORAL_TABLET | Freq: Every day | ORAL | 2 refills | Status: AC

## 2020-08-23 MED ORDER — FUROSEMIDE 20 MG PO TABS: 20.0000 mg | ORAL_TABLET | Freq: Every day | ORAL | 2 refills | Status: AC | PRN

## 2020-08-23 MED ORDER — ENSURE ENLIVE PO LIQD: 237.0000 mL | Freq: Two times a day (BID) | ORAL | 12 refills | Status: AC

## 2020-08-23 NOTE — Telephone Encounter (Signed)
Pt was discharged from hospital without meds. I checked discharge summery and pt should be on the meds in question. Meds sent to [facility].

## 2020-08-28 ENCOUNTER — Telehealth (INDEPENDENT_AMBULATORY_CARE_PROVIDER_SITE_OTHER): Payer: Medicare Other | Admitting: Family Medicine

## 2020-08-28 ENCOUNTER — Encounter: Payer: Self-pay | Admitting: Family Medicine

## 2020-08-28 DIAGNOSIS — R159 Full incontinence of feces: Secondary | ICD-10-CM

## 2020-08-28 DIAGNOSIS — Z789 Other specified health status: Secondary | ICD-10-CM

## 2020-08-28 DIAGNOSIS — F0391 Unspecified dementia with behavioral disturbance: Secondary | ICD-10-CM | POA: Diagnosis not present

## 2020-08-28 DIAGNOSIS — R32 Unspecified urinary incontinence: Secondary | ICD-10-CM | POA: Diagnosis not present

## 2020-08-28 DIAGNOSIS — R4189 Other symptoms and signs involving cognitive functions and awareness: Secondary | ICD-10-CM

## 2020-08-28 DIAGNOSIS — L89159 Pressure ulcer of sacral region, unspecified stage: Secondary | ICD-10-CM

## 2020-08-28 DIAGNOSIS — Z7409 Other reduced mobility: Secondary | ICD-10-CM

## 2020-08-28 NOTE — Progress Notes (Signed)
MyChart Video visit  Subjective: CC: Hospital follow-up PCP: [name], DO [id number] [name] is a 86 y.o. male. Patient provides verbal consent for consult held via video.  Due to COVID-19 pandemic this visit was conducted virtually. This visit type was conducted due to national recommendations for restrictions regarding the COVID-19 Pandemic (e.g. social distancing, sheltering in place) in an effort to limit this patient's exposure and mitigate transmission in our community. All issues noted in this document were discussed and addressed.  A physical exam was not performed with this format.   Location of patient: home Location of provider: [facility] Others present for call: [name] (daughter) and Billey (son), Brooke Bonito. and wife  1. Decline in status Patient was recently discharged from the hospital 08/22/2020 after becoming septic. He was noted to have a community-acquired pneumonia, decline in status. Goals of care discussion was held and he was a DNR/DNI. Advanced directive was placed in his chart. He was set up with home health PT, which has been coming to the home. However, his daughter notes that they have not had much help otherwise. They have independently hired a CNA to try and help with bathing, other healthcare needs.  Daughter reports that he continues to not eat/ drink well since discharge from hospital. Consuming less than 500 cals. Not moving around.  PT worked with him in bed but not able to get out of bed.  He is confused easily and incontinent.  He is sleeping a lot.  His children, who are present for today's call, are considering placing him in a long-term care facility which does have nursing staff on hand at the [place], where his son lives. They are trying to get him closer to one of the children so that they can be more readily available should an emergency arise. Contact information is provided today to this facility so that we may complete an [place] to form.   ROS: Per HPI  No  Known Allergies Past Medical History:  Diagnosis Date   BPH (benign prostatic hyperplasia)    Cataract    GERD (gastroesophageal reflux disease)    Hyperlipidemia    Hypertension    PROSTATE CANCER 01/19/2011   Qualifier: Diagnosis of  By: Nils Pyle CMA (AAMA), [name]     Vitamin D deficiency     Current Outpatient Medications:    feeding supplement, ENSURE ENLIVE, (ENSURE ENLIVE) LIQD, Take 237 mLs by mouth 2 (two) times daily between meals., Disp: 237 mL, Rfl: 12   furosemide (LASIX) 20 MG tablet, Take 1 tablet (20 mg total) by mouth daily as needed for edema., Disp: 30 tablet, Rfl: 2   mirtazapine (REMERON) 7.5 MG tablet, Take 1 tablet (7.5 mg total) by mouth at bedtime., Disp: 30 tablet, Rfl: 2   pantoprazole (PROTONIX) 40 MG tablet, Take 1 tablet (40 mg total) by mouth daily at 6 (six) AM., Disp: 30 tablet, Rfl: 2  Gen: Elderly, frail. No acute distress. Resting in bed but does open his eyes and respond to provider when spoken to. He responds appropriately. Pulmonary: Does not appear to be in respiratory distress  Assessment/ Plan: 86 y.o. male   1. Impaired mobility and ADLs FL 2 form completed and returned to [facility] [place]. She will get this coordinated for the care facility selected by his children. In the interim I have arranged for home health. He certainly will need a swallow evaluation to determine safety and if he needs a modified diet. In the interim, continue to feed ad lib.  Continue with PT which is already coming to the house. I have also ordered RN for evaluation of previous sacral wounds. Unsure if these are still present. Home health aide to assist with other personal care needs. I have encouraged his children to contact me should any other concerns arise. If he has not been transfer to his new facility within the next month, I would like to check in with him again to see how he is doing. - Ambulatory referral to [facility]  2. Cognitive decline - Ambulatory referral  to Home Health  3. Dementia with behavioral disturbance, unspecified dementia type ([place]) - Ambulatory referral to Home Health  4. Urinary incontinence, unspecified type - Ambulatory referral to [facility]  5. Incontinence of feces, unspecified fecal incontinence type - Ambulatory referral to [facility]  6. Pressure injury of skin of sacral region, unspecified injury stage - Ambulatory referral to [facility]   Start time: 4:30pm End time: 4:48pm  Total time spent on patient care (including video visit/ documentation): 30 minutes  [place], [place] [phone]

## 2020-08-29 ENCOUNTER — Encounter: Payer: Self-pay | Admitting: Family Medicine

## 2020-09-02 ENCOUNTER — Telehealth: Payer: Self-pay

## 2020-09-02 ENCOUNTER — Telehealth: Payer: Self-pay | Admitting: Family Medicine

## 2020-09-02 DIAGNOSIS — R630 Anorexia: Secondary | ICD-10-CM

## 2020-09-02 NOTE — Telephone Encounter (Signed)
Referral to hospice 

## 2020-09-02 NOTE — Telephone Encounter (Signed)
Palliative Medicine RN Note: Rec'd a call from pt's daughter [name]. She reports that pt is not eating well and that she is trying to get hospice to come evaluate him. His PCP is out of the office/on vacation.  I obtained a hospice referral order from Elie Confer, NP with [facility] who saw him during his admission. I spoke with Venia Carbon, [facility] liaison and notified her of referral. She reports that they will follow up with pt/family.  I requested specifically that she call [name] today at [phone] to update her on any progress towards hospice referral/admission/evaluation.  Marjie Skiff Eulala Newcombe, RN, BSN, [facility] Palliative Medicine Team 09/02/2020 10:29 AM Office [phone]

## 2020-09-05 ENCOUNTER — Telehealth: Payer: Medicare Other

## 2020-09-10 ENCOUNTER — Ambulatory Visit: Payer: Medicare Other | Admitting: Family Medicine

## 2020-09-13 ENCOUNTER — Ambulatory Visit: Payer: Medicare Other

## 2020-09-13 ENCOUNTER — Other Ambulatory Visit: Payer: Self-pay

## 2020-09-20 DEATH — deceased

## 2020-10-10 ENCOUNTER — Telehealth: Payer: Medicare Other
